# Patient Record
Sex: Female | Born: 1935 | Race: White | Hispanic: No | Smoking: Former smoker
Health system: Southern US, Community
[De-identification: ages and names within clinical notes are randomized; demographics above are authoritative.]

## PROBLEM LIST (undated history)

## (undated) DIAGNOSIS — C50919 Malignant neoplasm of unspecified site of unspecified female breast: Secondary | ICD-10-CM

## (undated) DIAGNOSIS — R634 Abnormal weight loss: Secondary | ICD-10-CM

## (undated) DIAGNOSIS — I6789 Other cerebrovascular disease: Secondary | ICD-10-CM

## (undated) DIAGNOSIS — C801 Malignant (primary) neoplasm, unspecified: Secondary | ICD-10-CM

## (undated) DIAGNOSIS — G231 Progressive supranuclear ophthalmoplegia [Steele-Richardson-Olszewski]: Secondary | ICD-10-CM

## (undated) DIAGNOSIS — R269 Unspecified abnormalities of gait and mobility: Secondary | ICD-10-CM

## (undated) DIAGNOSIS — G311 Senile degeneration of brain, not elsewhere classified: Secondary | ICD-10-CM

## (undated) DIAGNOSIS — S065X9A Traumatic subdural hemorrhage with loss of consciousness of unspecified duration, initial encounter: Secondary | ICD-10-CM

## (undated) DIAGNOSIS — G319 Degenerative disease of nervous system, unspecified: Secondary | ICD-10-CM

## (undated) DIAGNOSIS — S065XAA Traumatic subdural hemorrhage with loss of consciousness status unknown, initial encounter: Secondary | ICD-10-CM

## (undated) DIAGNOSIS — H3410 Central retinal artery occlusion, unspecified eye: Secondary | ICD-10-CM

## (undated) DIAGNOSIS — R1312 Dysphagia, oropharyngeal phase: Secondary | ICD-10-CM

## (undated) DIAGNOSIS — R270 Ataxia, unspecified: Secondary | ICD-10-CM

## (undated) HISTORY — PX: CATARACT EXTRACTION, BILATERAL: SHX1313

## (undated) HISTORY — DX: Traumatic subdural hemorrhage with loss of consciousness status unknown, initial encounter: S06.5XAA

## (undated) HISTORY — DX: Malignant neoplasm of unspecified site of unspecified female breast: C50.919

## (undated) HISTORY — DX: Abnormal weight loss: R63.4

## (undated) HISTORY — DX: Senile degeneration of brain, not elsewhere classified: G31.1

## (undated) HISTORY — DX: Traumatic subdural hemorrhage with loss of consciousness of unspecified duration, initial encounter: S06.5X9A

## (undated) HISTORY — DX: Central retinal artery occlusion, unspecified eye: H34.10

## (undated) HISTORY — DX: Unspecified abnormalities of gait and mobility: R26.9

## (undated) HISTORY — PX: TONSILLECTOMY: SUR1361

## (undated) HISTORY — DX: Progressive supranuclear ophthalmoplegia (steele-Richardson-olszewski): G23.1

## (undated) HISTORY — DX: Dysphagia, oropharyngeal phase: R13.12

## (undated) HISTORY — PX: BREAST SURGERY: SHX581

## (undated) HISTORY — DX: Malignant (primary) neoplasm, unspecified: C80.1

## (undated) HISTORY — DX: Other cerebrovascular disease: I67.89

## (undated) HISTORY — PX: EYE SURGERY: SHX253

---

## 1996-04-23 HISTORY — PX: MASTECTOMY: SHX3

## 1997-08-09 ENCOUNTER — Other Ambulatory Visit: Admission: RE | Admit: 1997-08-09 | Discharge: 1997-08-09 | Payer: Self-pay | Admitting: Oncology

## 1997-12-22 ENCOUNTER — Other Ambulatory Visit: Admission: RE | Admit: 1997-12-22 | Discharge: 1997-12-22 | Payer: Self-pay | Admitting: Family Medicine

## 1998-12-27 ENCOUNTER — Other Ambulatory Visit: Admission: RE | Admit: 1998-12-27 | Discharge: 1998-12-27 | Payer: Self-pay | Admitting: Family Medicine

## 1999-06-08 ENCOUNTER — Ambulatory Visit (HOSPITAL_COMMUNITY): Admission: RE | Admit: 1999-06-08 | Discharge: 1999-06-08 | Payer: Self-pay | Admitting: Obstetrics & Gynecology

## 1999-11-01 ENCOUNTER — Encounter: Payer: Self-pay | Admitting: Oncology

## 1999-11-01 ENCOUNTER — Ambulatory Visit (HOSPITAL_COMMUNITY): Admission: RE | Admit: 1999-11-01 | Discharge: 1999-11-01 | Payer: Self-pay | Admitting: Oncology

## 1999-12-22 ENCOUNTER — Encounter: Payer: Self-pay | Admitting: Family Medicine

## 1999-12-22 ENCOUNTER — Encounter: Admission: RE | Admit: 1999-12-22 | Discharge: 1999-12-22 | Payer: Self-pay | Admitting: Family Medicine

## 2000-01-04 ENCOUNTER — Other Ambulatory Visit: Admission: RE | Admit: 2000-01-04 | Discharge: 2000-01-04 | Payer: Self-pay | Admitting: Family Medicine

## 2000-10-31 ENCOUNTER — Ambulatory Visit (HOSPITAL_COMMUNITY): Admission: RE | Admit: 2000-10-31 | Discharge: 2000-10-31 | Payer: Self-pay | Admitting: Gastroenterology

## 2000-12-25 ENCOUNTER — Encounter: Admission: RE | Admit: 2000-12-25 | Discharge: 2000-12-25 | Payer: Self-pay | Admitting: Family Medicine

## 2000-12-25 ENCOUNTER — Encounter: Payer: Self-pay | Admitting: Family Medicine

## 2001-12-31 ENCOUNTER — Encounter: Payer: Self-pay | Admitting: Family Medicine

## 2001-12-31 ENCOUNTER — Encounter: Admission: RE | Admit: 2001-12-31 | Discharge: 2001-12-31 | Payer: Self-pay | Admitting: Family Medicine

## 2003-01-13 ENCOUNTER — Encounter: Payer: Self-pay | Admitting: Family Medicine

## 2003-01-13 ENCOUNTER — Encounter: Admission: RE | Admit: 2003-01-13 | Discharge: 2003-01-13 | Payer: Self-pay | Admitting: Family Medicine

## 2004-01-25 ENCOUNTER — Encounter: Admission: RE | Admit: 2004-01-25 | Discharge: 2004-01-25 | Payer: Self-pay | Admitting: Oncology

## 2005-01-08 ENCOUNTER — Ambulatory Visit: Payer: Self-pay | Admitting: Oncology

## 2005-02-27 ENCOUNTER — Encounter: Admission: RE | Admit: 2005-02-27 | Discharge: 2005-02-27 | Payer: Self-pay | Admitting: Family Medicine

## 2005-03-05 ENCOUNTER — Ambulatory Visit: Payer: Self-pay | Admitting: Oncology

## 2005-10-24 ENCOUNTER — Emergency Department (HOSPITAL_COMMUNITY): Admission: EM | Admit: 2005-10-24 | Discharge: 2005-10-24 | Payer: Self-pay | Admitting: Emergency Medicine

## 2005-11-07 ENCOUNTER — Encounter: Admission: RE | Admit: 2005-11-07 | Discharge: 2005-11-07 | Payer: Self-pay | Admitting: Gastroenterology

## 2006-03-06 ENCOUNTER — Encounter: Admission: RE | Admit: 2006-03-06 | Discharge: 2006-03-06 | Payer: Self-pay | Admitting: Family Medicine

## 2007-03-11 ENCOUNTER — Encounter: Admission: RE | Admit: 2007-03-11 | Discharge: 2007-03-11 | Payer: Self-pay | Admitting: Family Medicine

## 2008-03-11 ENCOUNTER — Encounter: Admission: RE | Admit: 2008-03-11 | Discharge: 2008-03-11 | Payer: Self-pay | Admitting: Family Medicine

## 2009-03-15 ENCOUNTER — Encounter: Admission: RE | Admit: 2009-03-15 | Discharge: 2009-03-15 | Payer: Self-pay | Admitting: Family Medicine

## 2009-10-14 ENCOUNTER — Encounter: Admission: RE | Admit: 2009-10-14 | Discharge: 2009-10-14 | Payer: Self-pay | Admitting: Family Medicine

## 2009-10-14 ENCOUNTER — Inpatient Hospital Stay (HOSPITAL_COMMUNITY): Admission: EM | Admit: 2009-10-14 | Discharge: 2009-10-18 | Payer: Self-pay | Admitting: Emergency Medicine

## 2009-10-14 DIAGNOSIS — G311 Senile degeneration of brain, not elsewhere classified: Secondary | ICD-10-CM

## 2009-10-14 DIAGNOSIS — I6789 Other cerebrovascular disease: Secondary | ICD-10-CM

## 2009-10-14 HISTORY — DX: Other cerebrovascular disease: I67.89

## 2009-10-14 HISTORY — DX: Senile degeneration of brain, not elsewhere classified: G31.1

## 2009-10-17 ENCOUNTER — Ambulatory Visit: Payer: Self-pay | Admitting: Vascular Surgery

## 2010-04-14 ENCOUNTER — Encounter
Admission: RE | Admit: 2010-04-14 | Discharge: 2010-04-14 | Payer: Self-pay | Source: Home / Self Care | Attending: Internal Medicine | Admitting: Internal Medicine

## 2010-07-10 LAB — LIPID PANEL
HDL: 64 mg/dL (ref >39)
LDL Cholesterol: 132 mg/dL — ABNORMAL HIGH (ref 0–99)
Total CHOL/HDL Ratio: 3.3 RATIO

## 2010-07-10 LAB — PROTIME-INR: INR: 1.03 (ref 0.00–1.49)

## 2010-07-10 LAB — CBC
HCT: 38.4 % (ref 36.0–46.0)
HCT: 39.8 % (ref 36.0–46.0)
Hemoglobin: 12.1 g/dL (ref 12.0–15.0)
MCH: 32.3 pg (ref 26.0–34.0)
MCH: 32.4 pg (ref 26.0–34.0)
MCHC: 34 g/dL (ref 30.0–36.0)
MCHC: 34.2 g/dL (ref 30.0–36.0)
RBC: 3.84 MIL/uL — ABNORMAL LOW (ref 3.87–5.11)
RBC: 4.06 MIL/uL (ref 3.87–5.11)
RDW: 12.5 % (ref 11.5–15.5)
RDW: 12.9 % (ref 11.5–15.5)
WBC: 7.3 10*3/uL (ref 4.0–10.5)

## 2010-07-10 LAB — BASIC METABOLIC PANEL
BUN: 23 mg/dL (ref 6–23)
CO2: 25 mEq/L (ref 19–32)
Calcium: 8.8 mg/dL (ref 8.4–10.5)
Calcium: 9.3 mg/dL (ref 8.4–10.5)
GFR calc non Af Amer: 52 mL/min — ABNORMAL LOW (ref >60)
Glucose, Bld: 89 mg/dL (ref 70–99)
Glucose, Bld: 91 mg/dL (ref 70–99)
Potassium: 3.8 mEq/L (ref 3.5–5.1)
Potassium: 4.2 mEq/L (ref 3.5–5.1)
Sodium: 143 mEq/L (ref 135–145)

## 2010-07-10 LAB — VITAMIN B12: Vitamin B-12: 597 pg/mL (ref 211–911)

## 2010-07-10 LAB — CARDIAC PANEL(CRET KIN+CKTOT+MB+TROPI)
CK, MB: 1.7 ng/mL (ref 0.3–4.0)
CK, MB: 1.8 ng/mL (ref 0.3–4.0)
Relative Index: 1.4 (ref 0.0–2.5)
Relative Index: 1.5 (ref 0.0–2.5)
Total CK: 118 U/L (ref 7–177)
Troponin I: 0.01 ng/mL (ref 0.00–0.06)

## 2010-07-10 LAB — DIFFERENTIAL
Basophils Absolute: 0 10*3/uL (ref 0.0–0.1)
Eosinophils Relative: 2 % (ref 0–5)
Lymphs Abs: 1.7 10*3/uL (ref 0.7–4.0)
Monocytes Absolute: 0.4 10*3/uL (ref 0.1–1.0)
Monocytes Relative: 6 % (ref 3–12)

## 2010-07-10 LAB — ABO/RH: ABO/RH(D): O POS

## 2010-07-10 LAB — RPR: RPR Ser Ql: NONREACTIVE

## 2010-07-10 LAB — CK TOTAL AND CKMB (NOT AT ARMC): Relative Index: 1.3 (ref 0.0–2.5)

## 2010-07-10 LAB — TYPE AND SCREEN: ABO/RH(D): O POS

## 2010-07-10 LAB — TSH: TSH: 1.948 u[IU]/mL (ref 0.350–4.500)

## 2010-07-10 LAB — FOLATE RBC: RBC Folate: 1088 ng/mL — ABNORMAL HIGH (ref 180–600)

## 2010-07-10 LAB — RETICULOCYTES
Retic Count, Absolute: 41.2 10*3/uL (ref 19.0–186.0)
Retic Ct Pct: 1 % (ref 0.4–3.1)

## 2010-07-10 LAB — POCT CARDIAC MARKERS: Troponin i, poc: <0.05 ng/mL (ref 0.00–0.09)

## 2010-07-10 LAB — TROPONIN I: Troponin I: 0.03 ng/mL (ref 0.00–0.06)

## 2010-09-08 NOTE — Procedures (Signed)
[place]. [facility]  Patient:    Monica Waller, Monica Waller                        MRN: [id number] Proc. Date: 10/31/00 Adm. Date:  [id number] Attending:  Orland Mustard CC:         Gita Kudo, M.D.  Abran Cantor. Clovis Riley, M.D.  Valentino Hue. Magrinat, M.D.   Procedure Report  PROCEDURE:  Colonoscopy.  MEDICATIONS:  Fentanyl 60 mcg, Versed 8 mg IV.  SCOPE:  Started with pediatric Olympus colonoscope and switched to adult colonoscope.  INDICATION:  A 77 year old who has a history of breast cancer.  Her brother died of colon cancer, and her father may have had colon cancer.  This was done for screening.  DESCRIPTION OF PROCEDURE:  The procedure had been explained to the patient and consent obtained.  With the patient in the left lateral decubitus position, the Olympus pediatric video colonoscope was inserted and advanced under direct visualization.  The prep was quite good.  Patient had an extremely long, tortuous colon and despite multiple maneuvers, we were unable to advance beyond the transverse colon.  I then removed the scope and inserted the adult Olympus colonoscope, and this scope was able to advance to the hepatic flexure but not down into the cecum despite placing the patient in the left lateral, supine, right lateral, and prone positions and using abdominal pressure, multiple other maneuvers.  The scope was withdrawn, and the transverse colon, splenic flexure, descending, and sigmoid colon were seen well.  No polyps or other lesions were seen.  The patient tolerated the procedure well.  ASSESSMENT:  Colonoscopy negative to the hepatic flexure.  PLAN:  Will plan on screening her in five years with a barium enema and sigmoidoscopy and will go ahead and see her back in the office in six weeks to discuss this and to consider a barium enema and check her stools. DD:  10/31/00 TD:  10/31/00 Job: [id number]

## 2011-07-04 ENCOUNTER — Emergency Department (HOSPITAL_BASED_OUTPATIENT_CLINIC_OR_DEPARTMENT_OTHER)
Admission: EM | Admit: 2011-07-04 | Discharge: 2011-07-05 | Disposition: A | Discharge status: Home / Self Care | Payer: Medicare Other | Attending: Emergency Medicine | Admitting: Emergency Medicine

## 2011-07-04 ENCOUNTER — Encounter (HOSPITAL_BASED_OUTPATIENT_CLINIC_OR_DEPARTMENT_OTHER): Payer: Self-pay | Admitting: *Deleted

## 2011-07-04 DIAGNOSIS — W19XXXA Unspecified fall, initial encounter: Secondary | ICD-10-CM

## 2011-07-04 DIAGNOSIS — S0100XA Unspecified open wound of scalp, initial encounter: Secondary | ICD-10-CM | POA: Insufficient documentation

## 2011-07-04 DIAGNOSIS — S0101XA Laceration without foreign body of scalp, initial encounter: Secondary | ICD-10-CM

## 2011-07-04 DIAGNOSIS — W1809XA Striking against other object with subsequent fall, initial encounter: Secondary | ICD-10-CM | POA: Insufficient documentation

## 2011-07-04 DIAGNOSIS — G319 Degenerative disease of nervous system, unspecified: Secondary | ICD-10-CM | POA: Insufficient documentation

## 2011-07-04 HISTORY — DX: Ataxia, unspecified: R27.0

## 2011-07-04 HISTORY — DX: Degenerative disease of nervous system, unspecified: G31.9

## 2011-07-04 NOTE — ED Notes (Signed)
Pt. Has had a L mastectomy 15 yrs ago per pt.

## 2011-07-04 NOTE — ED Notes (Signed)
Pt arrived via [facility] s/p fall from standing.  Pt has no neck or back pain and small bruise noted to back right side of head by EMS.

## 2011-07-05 ENCOUNTER — Emergency Department (INDEPENDENT_AMBULATORY_CARE_PROVIDER_SITE_OTHER): Payer: Medicare Other

## 2011-07-05 DIAGNOSIS — S0190XA Unspecified open wound of unspecified part of head, initial encounter: Secondary | ICD-10-CM

## 2011-07-05 DIAGNOSIS — W19XXXA Unspecified fall, initial encounter: Secondary | ICD-10-CM

## 2011-07-05 NOTE — ED Notes (Signed)
Pt. Is in no distress.   RN Earlene Plater called [facility] to check on living will status.  Pt. Has only in house DNR with out of house full code per Darral Dash at [facility] where Pt. Lives.

## 2011-07-05 NOTE — ED Notes (Signed)
MD at bedside. 

## 2011-07-05 NOTE — ED Notes (Signed)
Call placed for pt transport back to [facility].

## 2011-07-05 NOTE — ED Notes (Signed)
Dressing applied per MD order, pt tolerated well.

## 2011-07-05 NOTE — ED Notes (Signed)
Pt report called and given to Clydie Braun at [facility].

## 2011-07-05 NOTE — ED Notes (Signed)
Pt. Fell after using the rest room and returning to her room she fell backward hitting the dresser with the L back side of her head.  Noted small 1inch laceration with controlled bleeding.

## 2011-07-05 NOTE — ED Notes (Signed)
Laceration to right side of head repaired by Dr Jeraldine Loots using staples, pt tolerated well.

## 2011-07-05 NOTE — ED Provider Notes (Signed)
History     CSN: [id number]  Arrival date & time 07/04/11  2340   First MD Initiated Contact with Patient 07/05/11 0025      Chief Complaint  Patient presents with  . Fall    (Consider location/radiation/quality/duration/timing/severity/associated sxs/prior treatment) HPI The patient presents with head pain and a laceration.  She notes that she stumbled just prior to presentation, fell striking the right side of her head against a piece of furniture.  No loss of consciousness, no emesis. Since the event there has been mild bleeding from the wound and persistent pain, described as soreness in the area.  at relief thus far. No new visual changes, ataxia, weakness, aphasia or any other focal complaints. Past Medical History  Diagnosis Date  . Cerebral degeneration   . Ataxia     Past Surgical History  Procedure Date  . Mastectomy     History reviewed. No pertinent family history.  History  Substance Use Topics  . Smoking status: Not on file  . Smokeless tobacco: Not on file  . Alcohol Use:     OB History    Grav Para Term Preterm Abortions TAB SAB Ect Mult Living                  Review of Systems  Constitutional:       HPI  HENT:       HPI otherwise negative  Eyes: Negative for pain, redness and visual disturbance.  Respiratory:       HPI, otherwise negative  Cardiovascular:       HPI, otherwise nmegative  Gastrointestinal: Negative for vomiting.  Genitourinary:       HPI, otherwise negative  Musculoskeletal:       HPI, otherwise negative  Skin: Negative.   Neurological: Negative for syncope.    Allergies  Review of patient's allergies indicates no known allergies.  Home Medications   Current Outpatient Rx  Name Route Sig Dispense Refill  . CALCIUM CARB-CHOLECALCIFEROL 600-800 MG-UNIT PO TABS Oral Take 1 tablet by mouth.    . CENTRUM PO CHEW Oral Chew 1 tablet by mouth daily.      BP 138/61  Pulse 66  Temp(Src) 98.4 F (36.9 C) (Oral)  Resp  20  SpO2 99%  Physical Exam  Nursing note and vitals reviewed. Constitutional: She is oriented to person, place, and time. She appears well-developed and well-nourished. No distress.  HENT:  Head: Normocephalic. Head is with laceration. Head is without raccoon's eyes, without Battle's sign, without abrasion, without contusion and without left periorbital erythema. Hair is normal.    Eyes: Conjunctivae and EOM are normal. Pupils are equal, round, and reactive to light.  Cardiovascular: Normal rate and regular rhythm.   Pulmonary/Chest: Effort normal and breath sounds normal. No stridor. No respiratory distress.  Abdominal: She exhibits no distension.  Musculoskeletal: She exhibits no edema.  Neurological: She is alert and oriented to person, place, and time. No cranial nerve deficit.  Skin: Skin is warm and dry.  Psychiatric: She has a normal mood and affect.    ED Course  LACERATION REPAIR Date/Time: 07/05/2011 1:19 AM Performed by: Gerhard Munch Authorized by: Gerhard Munch Consent: Verbal consent obtained. Written consent not obtained. The procedure was performed in an emergent situation. Risks and benefits: risks, benefits and alternatives were discussed Consent given by: patient Patient identity confirmed: verbally with patient Time out: Immediately prior to procedure a "time out" was called to verify the correct patient, procedure, equipment, support staff  and site/side marked as required. Body area: head/neck Location details: scalp Laceration length: 4 cm Tendon involvement: none Nerve involvement: none Vascular damage: no Local anesthetic: None. Preparation: Patient was prepped and draped in the usual sterile fashion. Irrigation solution: saline Irrigation method: syringe Debridement: none Degree of undermining: none Skin closure: staples Number of sutures: 2 Technique: simple Approximation: close Approximation difficulty: simple Dressing: antibiotic  ointment Patient tolerance: Patient tolerated the procedure well with no immediate complications.   (including critical care time)  Labs Reviewed - No data to display Ct Head Wo Contrast  07/05/2011  *RADIOLOGY REPORT*  Clinical Data: Fall  CT HEAD WITHOUT CONTRAST  Technique:  Contiguous axial images were obtained from the base of the skull through the vertex without contrast.  Comparison: 10/14/2009  Findings: Chronic ischemic changes.  Global atrophy.  No mass effect, midline shift, or acute intracranial hemorrhage.  Nasal septum is deviated.  Mastoid air cells are clear.  Minimal mucous material in the ethmoid air cells.  Intact cranium.  IMPRESSION: No acute intracranial pathology.  Original Report Authenticated By: Donavan Burnet, M.D.   CT reviewed by me  1. Fall   2. Laceration of scalp       MDM  This elderly patient presents in no distress after a seemingly mechanical fall, now with head pain and leaking wound to her right scalp.  Nondistended palpation, and is concern for intracranial hemorrhage given the severity of the trauma.  The patient's CAT scan did not demonstrate acute bleeding nor fracture.  The wound was repaired as above.  The patient was discharged in stable condition back to her nursing home.     Gerhard Munch, MD 07/05/11 0120

## 2011-09-12 ENCOUNTER — Ambulatory Visit: Payer: Medicare Other | Attending: Neurology | Admitting: Physical Therapy

## 2011-09-12 DIAGNOSIS — IMO0001 Reserved for inherently not codable concepts without codable children: Secondary | ICD-10-CM | POA: Insufficient documentation

## 2011-09-12 DIAGNOSIS — M6281 Muscle weakness (generalized): Secondary | ICD-10-CM | POA: Insufficient documentation

## 2011-09-12 DIAGNOSIS — R269 Unspecified abnormalities of gait and mobility: Secondary | ICD-10-CM | POA: Insufficient documentation

## 2011-09-12 DIAGNOSIS — R279 Unspecified lack of coordination: Secondary | ICD-10-CM | POA: Insufficient documentation

## 2011-09-18 ENCOUNTER — Ambulatory Visit: Payer: Medicare Other | Admitting: Physical Therapy

## 2011-09-24 ENCOUNTER — Ambulatory Visit: Payer: Medicare Other | Attending: Neurology | Admitting: Physical Therapy

## 2011-09-24 DIAGNOSIS — R279 Unspecified lack of coordination: Secondary | ICD-10-CM | POA: Insufficient documentation

## 2011-09-24 DIAGNOSIS — M6281 Muscle weakness (generalized): Secondary | ICD-10-CM | POA: Insufficient documentation

## 2011-09-24 DIAGNOSIS — R269 Unspecified abnormalities of gait and mobility: Secondary | ICD-10-CM | POA: Insufficient documentation

## 2011-09-24 DIAGNOSIS — IMO0001 Reserved for inherently not codable concepts without codable children: Secondary | ICD-10-CM | POA: Insufficient documentation

## 2011-09-26 ENCOUNTER — Ambulatory Visit: Payer: Medicare Other

## 2011-09-27 ENCOUNTER — Encounter: Payer: Medicare Other | Admitting: Physical Therapy

## 2011-10-01 ENCOUNTER — Ambulatory Visit: Payer: Medicare Other | Admitting: Physical Therapy

## 2011-10-02 ENCOUNTER — Encounter: Payer: Medicare Other | Admitting: Physical Therapy

## 2011-10-03 ENCOUNTER — Ambulatory Visit: Payer: Medicare Other

## 2011-10-04 ENCOUNTER — Encounter: Payer: Medicare Other | Admitting: Physical Therapy

## 2011-10-08 ENCOUNTER — Ambulatory Visit: Payer: Medicare Other | Admitting: Physical Therapy

## 2011-10-09 ENCOUNTER — Encounter: Payer: Medicare Other | Admitting: Physical Therapy

## 2011-10-10 ENCOUNTER — Ambulatory Visit: Payer: Medicare Other | Admitting: Physical Therapy

## 2011-10-11 ENCOUNTER — Encounter: Payer: Medicare Other | Admitting: Physical Therapy

## 2011-10-15 ENCOUNTER — Ambulatory Visit: Payer: Medicare Other

## 2011-10-17 ENCOUNTER — Encounter: Payer: Medicare Other | Admitting: Physical Therapy

## 2012-06-04 ENCOUNTER — Encounter: Payer: Self-pay | Admitting: Neurology

## 2012-06-04 DIAGNOSIS — R6889 Other general symptoms and signs: Secondary | ICD-10-CM

## 2012-06-04 DIAGNOSIS — G231 Progressive supranuclear ophthalmoplegia [Steele-Richardson-Olszewski]: Secondary | ICD-10-CM

## 2012-06-04 DIAGNOSIS — R269 Unspecified abnormalities of gait and mobility: Secondary | ICD-10-CM

## 2012-06-04 DIAGNOSIS — D649 Anemia, unspecified: Secondary | ICD-10-CM | POA: Insufficient documentation

## 2012-07-18 ENCOUNTER — Encounter: Payer: Self-pay | Admitting: Neurology

## 2012-07-18 ENCOUNTER — Ambulatory Visit: Payer: Self-pay | Admitting: Neurology

## 2012-07-18 ENCOUNTER — Ambulatory Visit (INDEPENDENT_AMBULATORY_CARE_PROVIDER_SITE_OTHER): Payer: Medicare Other | Admitting: Neurology

## 2012-07-18 VITALS — BP 113/63 | HR 80 | Ht 65.25 in | Wt 126.0 lb

## 2012-07-18 DIAGNOSIS — G231 Progressive supranuclear ophthalmoplegia [Steele-Richardson-Olszewski]: Secondary | ICD-10-CM

## 2012-07-18 DIAGNOSIS — G238 Other specified degenerative diseases of basal ganglia: Secondary | ICD-10-CM

## 2012-07-18 DIAGNOSIS — R269 Unspecified abnormalities of gait and mobility: Secondary | ICD-10-CM

## 2012-07-18 NOTE — Patient Instructions (Signed)
Walk only with assistance. May use the wheelchair as a walker.    Progressive Supranuclear Palsy Progressive supranuclear palsy (PSP) is a rare brain disorder. It causes serious and permanent problems with control of gait and balance. The symptoms of PSP are caused by a gradual deterioration of brain cells in a few tiny but important places at the base of the brain. This region is called the brainstem. SYMPTOMS   The most obvious sign of the disease is an inability to aim the eyes properly. This happens because of lesions in the area of the brain that coordinates eye movements. Some patients describe this effect as a blurring.  PSP patients often show changes mood and behavior. This includes:  Depression.  Apathy.  Progressive mild dementia.  The pattern of signs and symptoms can vary greatly from person to person. DIAGNOSIS  PSP is often misdiagnosed because some of its symptoms are very much like those of:  Parkinson's disease.  Alzheimer's disease.  Rare neurodegenerative disorders, such as Creutzfeldt-Jakob disease. The key to establishing the diagnosis of PSP is the identification of early gait instability and difficulty moving the eyes, the hallmark of the disease. Also important is ruling out other similar disorders, some of which are treatable. Although PSP gets progressively worse, no one dies from PSP itself. TREATMENT  There is currently no effective treatment for PSP. Scientists are searching for better ways to manage the disease.  In some patients, the slowness, stiffness, and balance problems of PSP may respond to antiparkinsonian agents such as levodopa, or levodopa combined with anticholinergic agents. But the effect is usually temporary.  The speech, vision, and swallowing difficulties usually do not respond to any drug treatment. Another group of drugs that has been of some modest success in PSP are antidepressant medications. The most commonly used of these drugs  are Elavil, and Tofranil. The anti-PSP benefit of these drugs seems not to be related to their ability to relieve depression.  Non-drug treatment for PSP can take many forms. Patients often use weighted walking aids because of their tendency to fall backward.  Bifocals or special glasses called prisms are sometimes prescribed for PSP patients. They can correct the difficulty of looking down.  Formal physical therapy is of no proven benefit in PSP. But certain exercises can keep the joints limber.  A surgical procedure may be needed when there are swallowing disturbances. It is called a gastrostomy. This surgery involves the placement of a tube through the skin of the abdomen into the stomach (intestine) for feeding purposes. Document Released: 03/30/2002 Document Revised: 07/02/2011 Document Reviewed: 04/09/2005 [facility] Patient Information 2013 [place], [place].

## 2012-07-18 NOTE — Progress Notes (Signed)
Reason for visit: Progressive supranuclear palsy  Monica Waller is an 79 y.o. female  History of present illness:  Monica Waller is an 79 year old left-handed white female with a history of progressive supranuclear palsy. Since last seen, the patient has indicated that she has increasing problems with reading. Essentially, she is unable to read at this time. The patient has severe vertical gaze paresis, and a significant gait disorder. The patient has had a fall within the last 2 or 3 days. The patient has a tendency to fall backwards. The patient just recently has gotten a wheelchair, and she is now spending most of her time in a wheelchair, mobilizing by using her legs. The patient denies any issues with chewing or swallowing. No other new medical issues have come up since last seen. The patient returns for an evaluation.  Past Medical History  Diagnosis Date  . Cerebral degeneration   . Ataxia   . Gait disorder   . Progressive supranuclear palsy   . Subdural hematoma     Hx. of a left frontal    Past Surgical History  Procedure Laterality Date  . Mastectomy    . Tonsillectomy    . Cataract extraction, bilateral      Family History  Problem Relation Age of Onset  . Other Mother     "Old Age"  . Cancer Father   . Cancer Brother     Colon    Social history:  reports that she quit smoking about 47 years ago. Her smoking use included Cigarettes. She smoked 0.00 packs per day. She does not have any smokeless tobacco history on file. She reports that she does not drink alcohol. Her drug history is not on file.  Allergies: No Known Allergies  Medications:  Current Outpatient Prescriptions on File Prior to Visit  Medication Sig Dispense Refill  . Calcium Carb-Cholecalciferol (CALTRATE 600+D) 600-800 MG-UNIT TABS Take 1 tablet by mouth.      . multivitamin-iron-minerals-folic acid (CENTRUM) chewable tablet Chew 1 tablet by mouth daily.       No current facility-administered  medications on file prior to visit.    ROS:  Out of a complete 14 system review of symptoms, the patient complains only of the following symptoms, and all other reviewed systems are negative.  Gait disturbance Difficulty reading  Blood pressure 113/63, pulse 80, height 5' 5.25" (1.657 m), weight 126 lb (57.153 kg).  Physical Exam  General: The patient is alert and cooperative at the time of the examination.  Skin: No significant peripheral edema is noted.   Neurologic Exam  Cranial nerves: Facial symmetry is present. Speech is slightly dysphonic and dysarthric, not aphasic. Extraocular movements are severely restricted in a vertical plane, with mild restriction in the horizontal plane. Visual fields are full. Masking of the face is noted.  Motor: The patient has good strength in all 4 extremities.  Coordination: The patient has good finger-nose-finger and heel-to-shin bilaterally.  Gait and station: The patient has a slightly wide-based, unsteady gait. The patient can walk only with assistance. Tandem gait was not attempted. Romberg is negative, but it is unsteady. No drift is seen.  Reflexes: Deep tendon reflexes are symmetric.   Assessment/Plan:  1. Progressive supranuclear palsy  2. Gait disturbance  The patient is only to ambulate with assistance. The patient may be able to use her wheelchair as a walker, as this has more weight than the standard walker. The patient clearly is unstable with ambulation. The patient may  become wheelchair-bound completely. The patient is on minimal medications. The patient will followup through this office in about 6-8 months.  Marlan Palau MD 07/18/2012 2:56 PM  [facility] [address] [place], [place] [address]  Phone [phone] Fax [phone]

## 2012-11-03 LAB — BASIC METABOLIC PANEL
BUN: 15 mg/dL (ref 4–21)
Creatinine: 0.7 mg/dL (ref 0.5–1.1)
Glucose: 110 mg/dL
Potassium: 4.2 mmol/L (ref 3.4–5.3)
Sodium: 135 mmol/L — AB (ref 137–147)

## 2012-11-03 LAB — CBC AND DIFFERENTIAL: WBC: 8 10^3/mL

## 2012-11-04 ENCOUNTER — Emergency Department (HOSPITAL_COMMUNITY)
Admission: EM | Admit: 2012-11-04 | Discharge: 2012-11-04 | Disposition: A | Discharge status: Skilled Nursing Facility | Payer: Medicare Other | Attending: Emergency Medicine | Admitting: Emergency Medicine

## 2012-11-04 ENCOUNTER — Encounter (HOSPITAL_COMMUNITY): Payer: Self-pay | Admitting: Adult Health

## 2012-11-04 ENCOUNTER — Non-Acute Institutional Stay: Payer: Medicare Other | Admitting: Nurse Practitioner

## 2012-11-04 ENCOUNTER — Emergency Department (HOSPITAL_COMMUNITY): Payer: Medicare Other

## 2012-11-04 DIAGNOSIS — Y939 Activity, unspecified: Secondary | ICD-10-CM | POA: Insufficient documentation

## 2012-11-04 DIAGNOSIS — Y9210 Unspecified residential institution as the place of occurrence of the external cause: Secondary | ICD-10-CM | POA: Insufficient documentation

## 2012-11-04 DIAGNOSIS — Y92129 Unspecified place in nursing home as the place of occurrence of the external cause: Secondary | ICD-10-CM

## 2012-11-04 DIAGNOSIS — Z8669 Personal history of other diseases of the nervous system and sense organs: Secondary | ICD-10-CM | POA: Insufficient documentation

## 2012-11-04 DIAGNOSIS — Z853 Personal history of malignant neoplasm of breast: Secondary | ICD-10-CM | POA: Insufficient documentation

## 2012-11-04 DIAGNOSIS — W19XXXA Unspecified fall, initial encounter: Secondary | ICD-10-CM | POA: Insufficient documentation

## 2012-11-04 DIAGNOSIS — S51811A Laceration without foreign body of right forearm, initial encounter: Secondary | ICD-10-CM

## 2012-11-04 DIAGNOSIS — S0010XA Contusion of unspecified eyelid and periocular area, initial encounter: Secondary | ICD-10-CM | POA: Insufficient documentation

## 2012-11-04 DIAGNOSIS — IMO0002 Reserved for concepts with insufficient information to code with codable children: Secondary | ICD-10-CM

## 2012-11-04 DIAGNOSIS — S0181XS Laceration without foreign body of other part of head, sequela: Secondary | ICD-10-CM

## 2012-11-04 DIAGNOSIS — Z87891 Personal history of nicotine dependence: Secondary | ICD-10-CM | POA: Insufficient documentation

## 2012-11-04 DIAGNOSIS — S0180XA Unspecified open wound of other part of head, initial encounter: Secondary | ICD-10-CM | POA: Insufficient documentation

## 2012-11-04 DIAGNOSIS — S51809A Unspecified open wound of unspecified forearm, initial encounter: Secondary | ICD-10-CM | POA: Insufficient documentation

## 2012-11-04 DIAGNOSIS — D649 Anemia, unspecified: Secondary | ICD-10-CM

## 2012-11-04 DIAGNOSIS — G231 Progressive supranuclear ophthalmoplegia [Steele-Richardson-Olszewski]: Secondary | ICD-10-CM

## 2012-11-04 DIAGNOSIS — F0390 Unspecified dementia without behavioral disturbance: Secondary | ICD-10-CM | POA: Insufficient documentation

## 2012-11-04 DIAGNOSIS — Z23 Encounter for immunization: Secondary | ICD-10-CM | POA: Insufficient documentation

## 2012-11-04 DIAGNOSIS — G238 Other specified degenerative diseases of basal ganglia: Secondary | ICD-10-CM

## 2012-11-04 DIAGNOSIS — Z79899 Other long term (current) drug therapy: Secondary | ICD-10-CM | POA: Insufficient documentation

## 2012-11-04 MED ORDER — TETANUS-DIPHTH-ACELL PERTUSSIS 5-2.5-18.5 LF-MCG/0.5 IM SUSP
0.5000 mL | Freq: Once | INTRAMUSCULAR | Status: AC
  Administered 2012-11-04: 0.5 mL via INTRAMUSCULAR
  Filled 2012-11-04: qty 0.5

## 2012-11-04 NOTE — ED Provider Notes (Signed)
History    CSN: [id number] Arrival date & time 11/04/12  0039  First MD Initiated Contact with Patient 11/04/12 0041     Chief Complaint  Patient presents with  . Fall   (Consider location/radiation/quality/duration/timing/severity/associated sxs/prior Treatment) Patient is a 79 y.o. female presenting with fall. The history is provided by the nursing home. The history is limited by the condition of the patient (Dementia).  Fall  She was sent from a nursing care facility after his suffering an unwitnessed fall with laceration above her right eye. She does not know why she is here and does not know when her last tetanus immunization was. She is not complaining of anything. Past Medical History  Diagnosis Date  . Cerebral degeneration   . Ataxia   . Gait disorder   . Progressive supranuclear palsy   . Subdural hematoma     Hx. of a left frontal  . Cancer     Breast   Past Surgical History  Procedure Laterality Date  . Mastectomy    . Tonsillectomy    . Cataract extraction, bilateral     Family History  Problem Relation Age of Onset  . Other Mother     "Old Age"  . Cancer Father   . Cancer Brother     Colon   History  Substance Use Topics  . Smoking status: Former Smoker    Types: Cigarettes    Quit date: 05/04/1965  . Smokeless tobacco: Not on file  . Alcohol Use: No     Comment: Quit drinking   OB History   Grav Para Term Preterm Abortions TAB SAB Ect Mult Living                 Review of Systems  Unable to perform ROS: Dementia    Allergies  Review of patient's allergies indicates no known allergies.  Home Medications   Current Outpatient Rx  Name  Route  Sig  Dispense  Refill  . Calcium Carb-Cholecalciferol (CALTRATE 600+D) 600-800 MG-UNIT TABS   Oral   Take 1 tablet by mouth.         . multivitamin-iron-minerals-folic acid (CENTRUM) chewable tablet   Oral   Chew 1 tablet by mouth daily.         . polyvinyl alcohol (LIQUIFILM TEARS) 1.4  % ophthalmic solution      1 drop as needed.          BP 148/61  Pulse 75  Temp(Src) 98.3 F (36.8 C) (Oral)  Resp 16  SpO2 100% Physical Exam  Nursing note and vitals reviewed.  79 year old female, resting comfortably and in no acute distress. Vital signs are significant for mild hypertension with blood pressure 140/61. Oxygen saturation is 100%, which is normal. Head is normocephalic. Stellate laceration is present above the right eye and there is some ecchymosis around the left eye. PERRLA, EOMI. Oropharynx is clear. Neck is nontender without adenopathy or JVD. Back is nontender and there is no CVA tenderness. Lungs are clear without rales, wheezes, or rhonchi. Chest is nontender. Heart has regular rate and rhythm without murmur. Abdomen is soft, flat, nontender without masses or hepatosplenomegaly and peristalsis is normoactive. Extremities have no cyanosis or edema, full range of motion is present. Skin is warm and dry without rash. Neurologic: She is awake, alert, oriented to person and place. She is oriented to day and month but not year stating that it is 1908, cranial nerves are intact, there are no motor  or sensory deficits.  ED Course  Procedures (including critical care time) LACERATION REPAIR Performed by: ACZYS,AYTKZ Authorized by: SWFUX,NATFT Consent: Verbal consent obtained. Risks and benefits: risks, benefits and alternatives were discussed Consent given by: patient Patient identity confirmed: provided demographic data Prepped and Draped in normal sterile fashion Wound explored  Laceration Location: Right forehead  Laceration Length: 5 cm  No Foreign Bodies seen or palpated  Anesthesia: local infiltration  Local anesthetic: lidocaine 2% with epinephrine  Anesthetic total: 3 ml  Amount of cleaning: standard  Skin closure: close  Number of sutures: 10 5-0 Nylon  Technique: Simple interrupted with plastic-type alignment and closure of flaps    Patient tolerance: Patient tolerated the procedure well with no immediate complications.  Ct Head Wo Contrast  11/04/2012   *RADIOLOGY REPORT*  Clinical Data:  Unwitnessed fall with laceration.  CT HEAD WITHOUT CONTRAST CT MAXILLOFACIAL WITHOUT CONTRAST CT CERVICAL SPINE WITHOUT CONTRAST  Technique:  Multidetector CT imaging of the head, cervical spine, and maxillofacial structures were performed using the standard protocol without intravenous contrast. Multiplanar CT image reconstructions of the cervical spine and maxillofacial structures were also generated.  Comparison:  07/05/2011 head CT  CT HEAD  Findings:  Calvarium: Right periorbital laceration and contusion.  No evidence of underlying globe injury.  No retrobulbar hematoma.  No fracture.  Orbits: Bilateral cataract resection.  Brain: No evidence of acute abnormality, such as acute infarction, hemorrhage, hydrocephalus, or mass lesion/mass effect.Senescent brain changes including global cerebral atrophy and patchy bilateral cerebral white matter low attenuation - consistent with chronic small vessel ischemia.  Chronic hygroma in the posterior fossa on the left, with mild mass effect on the neighboring cerebellum.  IMPRESSION:  1.  No evidence of acute intracranial disease. 2.  Right periorbital laceration and contusion without evidence of global injury or fracture.  CT MAXILLOFACIAL  Findings:  Right temporal periorbital laceration and contusion.  No evidence of globe injury.  No intra orbital hematoma.  Status post bilateral cataract resection.  No evidence of acute fracture. Paranasal sinuses are essentially clear.  Leftward nasal septal spurring, contacting the middle turbinate.  IMPRESSION: Right periorbital contusion and laceration.  No evidence of globe injury or fracture.  CT CERVICAL SPINE  Findings:   No evidence of acute fracture.  There is are multiple mild listheses: C3-4 anterolisthesis, C4-5 anterolisthesis, C7-T1 anterolisthesis, T1-T2  anterolisthesis, and C5-6 retrolisthesis. At all of these levels, there is advanced facet osteoarthritis which could account for the subluxation.  No perivertebral edema identified to suggest ligamentous injury.  Diffuse degenerative disc disease, with disc narrowing most advanced at C4-5, C5-6, C6-7.  Posterior osteophytic ridging at C5- 6 effaces the ventral spinal canal.  Despite bulky facet and uncovertebral spurring, right more than left, no focally advanced neural foraminal stenosis.  Cervical carotid atherosclerosis.  Apical lungs are clear.  IMPRESSION:  1.  Negative for acute cervical spine fracture. 2.  Diffuse degenerative disc and facet disease, as above.   Original Report Authenticated By: Tiburcio Pea   Ct Cervical Spine Wo Contrast  11/04/2012   *RADIOLOGY REPORT*  Clinical Data:  Unwitnessed fall with laceration.  CT HEAD WITHOUT CONTRAST CT MAXILLOFACIAL WITHOUT CONTRAST CT CERVICAL SPINE WITHOUT CONTRAST  Technique:  Multidetector CT imaging of the head, cervical spine, and maxillofacial structures were performed using the standard protocol without intravenous contrast. Multiplanar CT image reconstructions of the cervical spine and maxillofacial structures were also generated.  Comparison:  07/05/2011 head CT  CT HEAD  Findings:  Calvarium: Right periorbital laceration and contusion.  No evidence of underlying globe injury.  No retrobulbar hematoma.  No fracture.  Orbits: Bilateral cataract resection.  Brain: No evidence of acute abnormality, such as acute infarction, hemorrhage, hydrocephalus, or mass lesion/mass effect.Senescent brain changes including global cerebral atrophy and patchy bilateral cerebral white matter low attenuation - consistent with chronic small vessel ischemia.  Chronic hygroma in the posterior fossa on the left, with mild mass effect on the neighboring cerebellum.  IMPRESSION:  1.  No evidence of acute intracranial disease. 2.  Right periorbital laceration and contusion  without evidence of global injury or fracture.  CT MAXILLOFACIAL  Findings:  Right temporal periorbital laceration and contusion.  No evidence of globe injury.  No intra orbital hematoma.  Status post bilateral cataract resection.  No evidence of acute fracture. Paranasal sinuses are essentially clear.  Leftward nasal septal spurring, contacting the middle turbinate.  IMPRESSION: Right periorbital contusion and laceration.  No evidence of globe injury or fracture.  CT CERVICAL SPINE  Findings:   No evidence of acute fracture.  There is are multiple mild listheses: C3-4 anterolisthesis, C4-5 anterolisthesis, C7-T1 anterolisthesis, T1-T2 anterolisthesis, and C5-6 retrolisthesis. At all of these levels, there is advanced facet osteoarthritis which could account for the subluxation.  No perivertebral edema identified to suggest ligamentous injury.  Diffuse degenerative disc disease, with disc narrowing most advanced at C4-5, C5-6, C6-7.  Posterior osteophytic ridging at C5- 6 effaces the ventral spinal canal.  Despite bulky facet and uncovertebral spurring, right more than left, no focally advanced neural foraminal stenosis.  Cervical carotid atherosclerosis.  Apical lungs are clear.  IMPRESSION:  1.  Negative for acute cervical spine fracture. 2.  Diffuse degenerative disc and facet disease, as above.   Original Report Authenticated By: Tiburcio Pea   Ct Maxillofacial Wo Cm  11/04/2012   *RADIOLOGY REPORT*  Clinical Data:  Unwitnessed fall with laceration.  CT HEAD WITHOUT CONTRAST CT MAXILLOFACIAL WITHOUT CONTRAST CT CERVICAL SPINE WITHOUT CONTRAST  Technique:  Multidetector CT imaging of the head, cervical spine, and maxillofacial structures were performed using the standard protocol without intravenous contrast. Multiplanar CT image reconstructions of the cervical spine and maxillofacial structures were also generated.  Comparison:  07/05/2011 head CT  CT HEAD  Findings:  Calvarium: Right periorbital laceration  and contusion.  No evidence of underlying globe injury.  No retrobulbar hematoma.  No fracture.  Orbits: Bilateral cataract resection.  Brain: No evidence of acute abnormality, such as acute infarction, hemorrhage, hydrocephalus, or mass lesion/mass effect.Senescent brain changes including global cerebral atrophy and patchy bilateral cerebral white matter low attenuation - consistent with chronic small vessel ischemia.  Chronic hygroma in the posterior fossa on the left, with mild mass effect on the neighboring cerebellum.  IMPRESSION:  1.  No evidence of acute intracranial disease. 2.  Right periorbital laceration and contusion without evidence of global injury or fracture.  CT MAXILLOFACIAL  Findings:  Right temporal periorbital laceration and contusion.  No evidence of globe injury.  No intra orbital hematoma.  Status post bilateral cataract resection.  No evidence of acute fracture. Paranasal sinuses are essentially clear.  Leftward nasal septal spurring, contacting the middle turbinate.  IMPRESSION: Right periorbital contusion and laceration.  No evidence of globe injury or fracture.  CT CERVICAL SPINE  Findings:   No evidence of acute fracture.  There is are multiple mild listheses: C3-4 anterolisthesis, C4-5 anterolisthesis, C7-T1 anterolisthesis, T1-T2 anterolisthesis, and C5-6 retrolisthesis. At all of these levels, there is  advanced facet osteoarthritis which could account for the subluxation.  No perivertebral edema identified to suggest ligamentous injury.  Diffuse degenerative disc disease, with disc narrowing most advanced at C4-5, C5-6, C6-7.  Posterior osteophytic ridging at C5- 6 effaces the ventral spinal canal.  Despite bulky facet and uncovertebral spurring, right more than left, no focally advanced neural foraminal stenosis.  Cervical carotid atherosclerosis.  Apical lungs are clear.  IMPRESSION:  1.  Negative for acute cervical spine fracture. 2.  Diffuse degenerative disc and facet disease, as  above.   Original Report Authenticated By: Tiburcio Pea     Date: 11/04/2012  Rate: 76  Rhythm: normal sinus rhythm  QRS Axis: normal  Intervals: normal  ST/T Wave abnormalities: normal  Conduction Disutrbances:none  Narrative Interpretation: Poor R-wave progression across precordium. No prior ECG available for comparison.  Old EKG Reviewed: none available   1. Fall at nursing home, initial encounter   2. Laceration of forearm, right, initial encounter     MDM  Unwitnessed fall with facial injury. She will need to be sent for CT scans to rule out occult neck and intracranial injury and will need to laceration repair. TDaP booster will be given.  CT scans are unremarkable. Laceration is noted to be complex with multiple flaps which need to be aligned. Plastic closure techniques were used and there was good cosmetic closure. She's returned to her nursing care facility to have sutures removed in 5 days.  Dione Booze, MD 11/04/12 [phone]

## 2012-11-04 NOTE — ED Notes (Signed)
Presents from [facility] with a unwitnessed fall. Staff found pt lying in bed covered in blood. Noted 1/2 inch laceration to right temple, pt does not recall fall. She is alert and oriented. Does not know year but answers month and age correctly. No other injuries noted, pt denies pain. PERRLA

## 2012-11-07 ENCOUNTER — Encounter: Payer: Self-pay | Admitting: Nurse Practitioner

## 2012-11-07 DIAGNOSIS — S0181XA Laceration without foreign body of other part of head, initial encounter: Secondary | ICD-10-CM | POA: Insufficient documentation

## 2012-11-07 NOTE — Progress Notes (Signed)
Patient ID: Monica Waller, female   DOB: Oct 19, 1923, 79 y.o.   MRN: [id number] Code Status: DNR  No Known Allergies  Chief Complaint  Patient presents with  . Medical Managment of Chronic Issues     s/p ER evaluation 11/04/12 for right forehead laceration sustained from falligng at [facility]    HPI: Patient is a 79 y.o. female seen in the AL at [facility] today for evaluation of right forehead skin laceration, s/p ED evaluation 11/04/12, and other chronic medical conditions.  Problem List Items Addressed This Visit   Anemia     Resolved, Hgb 12.0 11/03/12    Laceration of skin of forehead - Primary     Sustained from falling at [facility] 11/04/12, s/p ED evaluation, had CT head at ED showed no acute changes, suture closure for the laceration on the right forehead above the lateral eyebrow intact. Mild edema and erythema to the are seen. Suture will be removed in 5-7 days.     Progressive supranuclear palsy     Frequent falling, seeing Neurology regularly, staff reported the patient has difficulty looking down followed by the upgaze palsy and double vision at [facility]--will continue to observe the patient.        Review of Systems:  Review of Systems  Constitutional: Negative for fever, chills, weight loss, malaise/fatigue and diaphoresis.  HENT: Positive for hearing loss. Negative for ear pain, nosebleeds, congestion, sore throat, neck pain, tinnitus and ear discharge.   Eyes: Positive for double vision (?). Negative for blurred vision, photophobia, pain, discharge and redness.       ? supranuclear ophthalmoplegia  Respiratory: Negative for cough, hemoptysis, sputum production, shortness of breath, wheezing and stridor.   Cardiovascular: Negative for chest pain, palpitations, orthopnea, claudication, leg swelling and PND.  Gastrointestinal: Negative for heartburn, nausea, vomiting, abdominal pain, diarrhea, constipation, blood in stool and melena.  Genitourinary: Positive for frequency. Negative  for dysuria, urgency, hematuria and flank pain.  Musculoskeletal: Positive for falls. Negative for myalgias, back pain and joint pain.  Skin: Negative for itching and rash.  Neurological: Negative for dizziness, tingling, tremors, sensory change, speech change, focal weakness, seizures, loss of consciousness, weakness and headaches.  Endo/Heme/Allergies: Negative for environmental allergies and polydipsia. Does not bruise/bleed easily.  Psychiatric/Behavioral: Positive for memory loss. Negative for depression, suicidal ideas, hallucinations and substance abuse. The patient is not nervous/anxious and does not have insomnia.      Past Medical History  Diagnosis Date  . Cerebral degeneration   . Ataxia   . Gait disorder   . Progressive supranuclear palsy   . Subdural hematoma     Hx. of a left frontal  . Cancer     Breast   Past Surgical History  Procedure Laterality Date  . Mastectomy    . Tonsillectomy    . Cataract extraction, bilateral     Social History:   reports that she quit smoking about 47 years ago. Her smoking use included Cigarettes. She smoked 0.00 packs per day. She does not have any smokeless tobacco history on file. She reports that she does not drink alcohol. Her drug history is not on file.  Family History  Problem Relation Age of Onset  . Other Mother     "Old Age"  . Cancer Father   . Cancer Brother     Colon    Medications: Patient's Medications  New Prescriptions   No medications on file  Previous Medications   CALCIUM CARB-CHOLECALCIFEROL (CALTRATE 600+D) 600-800  MG-UNIT TABS    Take 1 tablet by mouth.   MULTIVITAMIN-IRON-MINERALS-FOLIC ACID (CENTRUM) CHEWABLE TABLET    Chew 1 tablet by mouth daily.   POLYVINYL ALCOHOL (LIQUIFILM TEARS) 1.4 % OPHTHALMIC SOLUTION    1 drop as needed.  Modified Medications   No medications on file  Discontinued Medications   No medications on file     Physical Exam: Physical Exam  Constitutional: She is  oriented to person, place, and time. She appears well-developed and well-nourished. No distress.  HENT:  Head: Normocephalic.  Right Ear: External ear normal.  Left Ear: External ear normal.  Nose: Nose normal.  Mouth/Throat: Oropharynx is clear and moist. No oropharyngeal exudate.  Eyes: Conjunctivae are normal. Right eye exhibits no discharge. Left eye exhibits no discharge. No scleral icterus.  Difficulty looking down followed by an upgaze ?palsy  Neck: Normal range of motion. Neck supple. No JVD present. No tracheal deviation present. No thyromegaly present.  Cardiovascular: Normal rate, regular rhythm, normal heart sounds and intact distal pulses.   No murmur heard. Pulmonary/Chest: Effort normal and breath sounds normal. No stridor. No respiratory distress. She has no wheezes. She has no rales. She exhibits no tenderness.  Abdominal: Soft. Bowel sounds are normal. She exhibits no distension. There is no tenderness. There is no rebound and no guarding.  Musculoskeletal: Normal range of motion. She exhibits no edema and no tenderness.  Lymphadenopathy:    She has no cervical adenopathy.  Neurological: She is alert and oriented to person, place, and time. She displays normal reflexes. A cranial nerve deficit is present. She exhibits normal muscle tone. Coordination abnormal.  Skin: Skin is warm and dry. No rash noted. She is not diaphoretic. No erythema. No pallor.  The right forehead skin laceration with suture closure above the lateral right eyebrow  Psychiatric: She has a normal mood and affect. Her speech is normal and behavior is normal. Judgment and thought content normal. Cognition and memory are impaired. She does not express impulsivity or inappropriate judgment. She exhibits abnormal recent memory. She exhibits normal remote memory.    Filed Vitals:   11/07/12 1057  BP: 114/60  Pulse: 74  Temp: 99.1 F (37.3 C)  TempSrc: Tympanic  Resp: 18      Labs reviewed: Basic  Metabolic Panel:  Recent Labs  11/91/47  NA 135*  K 4.2  BUN 15  CREATININE 0.7    CBC:  Recent Labs  11/03/12  WBC 8.0  HGB 12.0  HCT 36  PLT 313     Past Procedures:  11/04/12 CT head w/o CM:  IMPRESSION:   1.  No evidence of acute intracranial disease. 2.  Right periorbital laceration and contusion without evidence of global injury or fracture.   Assessment/Plan Laceration of skin of forehead Sustained from falling at [facility] 11/04/12, s/p ED evaluation, had CT head at ED showed no acute changes, suture closure for the laceration on the right forehead above the lateral eyebrow intact. Mild edema and erythema to the are seen. Suture will be removed in 5-7 days.   Progressive supranuclear palsy Frequent falling, seeing Neurology regularly, staff reported the patient has difficulty looking down followed by the upgaze palsy and double vision at [facility]--will continue to observe the patient.   Anemia Resolved, Hgb 12.0 11/03/12    Family/ Staff Communication: observe the patient.   Goals of Care: AL  Labs/tests ordered: none

## 2012-11-07 NOTE — Assessment & Plan Note (Signed)
Frequent falling, seeing Neurology regularly, staff reported the patient has difficulty looking down followed by the upgaze palsy and double vision at [facility]--will continue to observe the patient.

## 2012-11-07 NOTE — Assessment & Plan Note (Signed)
Resolved, Hgb 12.0 11/03/12   

## 2012-11-07 NOTE — Assessment & Plan Note (Addendum)
Sustained from falling at [facility] 11/04/12, s/p ED evaluation, had CT head at ED showed no acute changes, suture closure for the laceration on the right forehead above the lateral eyebrow intact. Mild edema and erythema to the are seen. Suture will be removed in 5-7 days.

## 2012-11-28 ENCOUNTER — Encounter: Payer: Self-pay | Admitting: *Deleted

## 2013-01-16 ENCOUNTER — Emergency Department (HOSPITAL_COMMUNITY)
Admission: EM | Admit: 2013-01-16 | Discharge: 2013-01-16 | Disposition: A | Discharge status: Home / Self Care | Payer: Medicare Other | Attending: Emergency Medicine | Admitting: Emergency Medicine

## 2013-01-16 ENCOUNTER — Emergency Department (HOSPITAL_COMMUNITY): Payer: Medicare Other

## 2013-01-16 ENCOUNTER — Non-Acute Institutional Stay: Payer: Medicare Other | Admitting: Nurse Practitioner

## 2013-01-16 ENCOUNTER — Encounter (HOSPITAL_COMMUNITY): Payer: Self-pay | Admitting: Emergency Medicine

## 2013-01-16 DIAGNOSIS — Y939 Activity, unspecified: Secondary | ICD-10-CM | POA: Insufficient documentation

## 2013-01-16 DIAGNOSIS — G238 Other specified degenerative diseases of basal ganglia: Secondary | ICD-10-CM

## 2013-01-16 DIAGNOSIS — Y929 Unspecified place or not applicable: Secondary | ICD-10-CM | POA: Insufficient documentation

## 2013-01-16 DIAGNOSIS — W19XXXA Unspecified fall, initial encounter: Secondary | ICD-10-CM

## 2013-01-16 DIAGNOSIS — S0101XA Laceration without foreign body of scalp, initial encounter: Secondary | ICD-10-CM

## 2013-01-16 DIAGNOSIS — W1809XA Striking against other object with subsequent fall, initial encounter: Secondary | ICD-10-CM | POA: Insufficient documentation

## 2013-01-16 DIAGNOSIS — N390 Urinary tract infection, site not specified: Secondary | ICD-10-CM

## 2013-01-16 DIAGNOSIS — Z87891 Personal history of nicotine dependence: Secondary | ICD-10-CM | POA: Insufficient documentation

## 2013-01-16 DIAGNOSIS — Z79899 Other long term (current) drug therapy: Secondary | ICD-10-CM | POA: Insufficient documentation

## 2013-01-16 DIAGNOSIS — F0390 Unspecified dementia without behavioral disturbance: Secondary | ICD-10-CM | POA: Insufficient documentation

## 2013-01-16 DIAGNOSIS — T148XXA Other injury of unspecified body region, initial encounter: Secondary | ICD-10-CM

## 2013-01-16 DIAGNOSIS — S0100XA Unspecified open wound of scalp, initial encounter: Secondary | ICD-10-CM | POA: Insufficient documentation

## 2013-01-16 DIAGNOSIS — W19XXXD Unspecified fall, subsequent encounter: Secondary | ICD-10-CM

## 2013-01-16 DIAGNOSIS — Z853 Personal history of malignant neoplasm of breast: Secondary | ICD-10-CM | POA: Insufficient documentation

## 2013-01-16 DIAGNOSIS — G231 Progressive supranuclear ophthalmoplegia [Steele-Richardson-Olszewski]: Secondary | ICD-10-CM

## 2013-01-16 DIAGNOSIS — D649 Anemia, unspecified: Secondary | ICD-10-CM

## 2013-01-16 NOTE — ED Notes (Signed)
Bed: WA13 Expected date:  Expected time:  Means of arrival:  Comments: EMS-fall 

## 2013-01-16 NOTE — ED Notes (Signed)
Per EMS-unwitnessed fall-patient states she hit head-small head laceration, bleeding controlled-abrasion left thigh-old bruise/hematoma on back, not related to fall-patient does not remember falling-no complaints of pain

## 2013-01-16 NOTE — ED Provider Notes (Addendum)
CSN: [id number]     Arrival date & time 01/16/13  0741 History   First MD Initiated Contact with Patient 01/16/13 [phone]     Chief Complaint  Patient presents with  . Fall    HPI Per EMS-unwitnessed fall-patient states she hit head-small head laceration, bleeding controlled-abrasion left thigh-old bruise/hematoma on back, not related to fall-patient does not remember falling-no complaints of pain  Past Medical History  Diagnosis Date  . Cerebral degeneration   . Ataxia   . Gait disorder   . Progressive supranuclear palsy   . Subdural hematoma     Hx. of a left frontal  . Cancer     Breast   Past Surgical History  Procedure Laterality Date  . Mastectomy    . Tonsillectomy    . Cataract extraction, bilateral    . Breast surgery     Family History  Problem Relation Age of Onset  . Other Mother     "Old Age"  . Cancer Father   . Cancer Brother     Colon   History  Substance Use Topics  . Smoking status: Former Smoker    Types: Cigarettes    Quit date: 05/04/1965  . Smokeless tobacco: Not on file  . Alcohol Use: No     Comment: Quit drinking   OB History   Grav Para Term Preterm Abortions TAB SAB Ect Mult Living                 Review of Systems  Unable to perform ROS: Dementia    Allergies  Review of patient's allergies indicates no known allergies.  Home Medications   Current Outpatient Rx  Name  Route  Sig  Dispense  Refill  . Calcium Carb-Cholecalciferol (CALTRATE 600+D) 600-800 MG-UNIT TABS   Oral   Take 1 tablet by mouth.         . ciprofloxacin (CIPRO) 500 MG tablet   Oral   Take 500 mg by mouth 2 (two) times daily. For 7 days. Stop 01/21/13         . multivitamin-iron-minerals-folic acid (CENTRUM) chewable tablet   Oral   Chew 1 tablet by mouth daily.         . polyvinyl alcohol (LIQUIFILM TEARS) 1.4 % ophthalmic solution      1 drop as needed. Use as needed, wait 3-5 minutes between eye drops.          BP 136/69  Pulse 67   Temp(Src) 98.1 F (36.7 C) (Oral)  Resp 16  SpO2 96% Physical Exam  Nursing note and vitals reviewed. Constitutional: She is oriented to person, place, and time. She appears well-developed and well-nourished. No distress.  HENT:  Head: Normocephalic and atraumatic.    Small abrasion/laceration with no active bleeding.  Eyes: Pupils are equal, round, and reactive to light.  Neck: Normal range of motion.  Cardiovascular: Normal rate and intact distal pulses.   Pulmonary/Chest: No respiratory distress.  Abdominal: Normal appearance. She exhibits no distension.  Musculoskeletal: Normal range of motion.       Right hip: She exhibits normal range of motion, no tenderness and no bony tenderness.       Left hip: She exhibits normal range of motion, no tenderness and no bony tenderness.  Neurological: She is alert and oriented to person, place, and time. No cranial nerve deficit.  Skin: Skin is warm and dry. No rash noted.  Psychiatric: She has a normal mood and affect. Her behavior is normal.  ED Course  Procedures (including critical care time) Labs Review Labs Reviewed - No data to display Imaging Review No results found.  MDM   1. Laceration of scalp, initial encounter   2. Fall, initial encounter       Nelia Shi, MD 01/16/13 4098  Nelia Shi, MD 01/30/13 [phone]

## 2013-01-16 NOTE — ED Notes (Signed)
PT[facility] called for transport to [facility] by [facility]

## 2013-01-16 NOTE — ED Notes (Signed)
PTAR called for transport back to [facility] Monica Waller 

## 2013-01-18 ENCOUNTER — Encounter: Payer: Self-pay | Admitting: Nurse Practitioner

## 2013-01-18 DIAGNOSIS — N390 Urinary tract infection, site not specified: Secondary | ICD-10-CM | POA: Insufficient documentation

## 2013-01-18 DIAGNOSIS — W19XXXA Unspecified fall, initial encounter: Secondary | ICD-10-CM | POA: Insufficient documentation

## 2013-01-18 DIAGNOSIS — T148XXA Other injury of unspecified body region, initial encounter: Secondary | ICD-10-CM | POA: Insufficient documentation

## 2013-01-18 NOTE — Assessment & Plan Note (Signed)
Frequent falling due to her supranuclear palsy and her lack of safety awareness--close supervision needed.

## 2013-01-18 NOTE — Assessment & Plan Note (Signed)
The left parietal and left lower middle back, c/o localized pain. ED evaluation 01/16/13 CT HEAD WITHOUT CONTRAST IMPRESSION: Atrophy and chronic microvascular ischemia. No acute abnormality

## 2013-01-18 NOTE — Assessment & Plan Note (Addendum)
Urine culture 01/15/13 showed E. Coli >100,000c/ml, 7 day course of Cipro 500 mg bid started 01/15/13 subsequently. She sustained the left parietal and left lower mid back hematomas from fall 01/16/13. ER evaluation 01/16/13. EXAM: CT HEAD WITHOUT CONTRAST,  IMPRESSION: Atrophy and chronic microvascular ischemia. No acute abnormality

## 2013-01-18 NOTE — Progress Notes (Signed)
Patient ID: Monica Waller, female   DOB: 1924-04-04, 79 y.o.   MRN: [id number] Code Status: DNR  No Known Allergies  Chief Complaint  Patient presents with  . Medical Managment of Chronic Issues    UTI, S/P falll, the right parietal and the left lower middle back hematoms.                                                                                                                          k    HPI: Patient is a 79 y.o. female seen in the AL at [facility] today for evaluation of UTI, s/p ED evaluation for the right parietal hematoma and s/p fall, and her other chronic medical conditions.  Problem List Items Addressed This Visit   Anemia     Resolved, Hgb 12.0 11/03/12      Fall     Frequent falling due to her supranuclear palsy and her lack of safety awareness--close supervision needed.     Hematoma     The left parietal and left lower middle back, c/o localized pain. ED evaluation 01/16/13 CT HEAD WITHOUT CONTRAST IMPRESSION: Atrophy and chronic microvascular ischemia. No acute abnormality     Progressive supranuclear palsy     Frequent falling, seeing Neurology regularly, staff reported the patient has difficulty looking down followed by the upgaze palsy and double vision at [facility]--will continue to observe the patient.       UTI (lower urinary tract infection) - Primary     Urine culture 01/15/13 showed E. Coli >100,000c/ml, 7 day course of Cipro 500 mg bid started 01/15/13 subsequently. She sustained the left parietal and left lower mid back hematomas from fall 01/16/13. ER evaluation 01/16/13. EXAM: CT HEAD WITHOUT CONTRAST,  IMPRESSION: Atrophy and chronic microvascular ischemia. No acute abnormality         Review of Systems:  Review of Systems  Constitutional: Negative for fever, chills, weight loss, malaise/fatigue and diaphoresis.  HENT: Positive for hearing loss. Negative for ear pain, nosebleeds, congestion, sore throat, neck pain, tinnitus and ear discharge.   Eyes:  Positive for double vision (?). Negative for blurred vision, photophobia, pain, discharge and redness.       ? supranuclear ophthalmoplegia  Respiratory: Negative for cough, hemoptysis, sputum production, shortness of breath, wheezing and stridor.   Cardiovascular: Negative for chest pain, palpitations, orthopnea, claudication, leg swelling and PND.  Gastrointestinal: Negative for heartburn, nausea, vomiting, abdominal pain, diarrhea, constipation, blood in stool and melena.  Genitourinary: Positive for frequency. Negative for dysuria, urgency, hematuria and flank pain.  Musculoskeletal: Positive for falls. Negative for myalgias, back pain and joint pain.  Skin: Negative for itching and rash.       The right parietal hematoma. The left lower middle back hematoma.   Neurological: Negative for dizziness, tingling, tremors, sensory change, speech change, focal weakness, seizures, loss of consciousness, weakness and headaches.  Endo/Heme/Allergies: Negative for environmental allergies and polydipsia. Does not bruise/bleed easily.  Psychiatric/Behavioral: Positive for memory loss. Negative for depression, suicidal ideas, hallucinations and substance abuse. The patient is not nervous/anxious and does not have insomnia.      Past Medical History  Diagnosis Date  . Cerebral degeneration   . Ataxia   . Gait disorder   . Progressive supranuclear palsy   . Subdural hematoma     Hx. of a left frontal  . Cancer     Breast   Past Surgical History  Procedure Laterality Date  . Mastectomy    . Tonsillectomy    . Cataract extraction, bilateral    . Breast surgery     Social History:   reports that she quit smoking about 47 years ago. Her smoking use included Cigarettes. She smoked 0.00 packs per day. She does not have any smokeless tobacco history on file. She reports that she does not drink alcohol. Her drug history is not on file.  Family History  Problem Relation Age of Onset  . Other Mother      "Old Age"  . Cancer Father   . Cancer Brother     Colon    Medications: Patient's Medications  New Prescriptions   No medications on file  Previous Medications   CALCIUM CARB-CHOLECALCIFEROL (CALTRATE 600+D) 600-800 MG-UNIT TABS    Take 1 tablet by mouth.   CIPROFLOXACIN (CIPRO) 500 MG TABLET    Take 500 mg by mouth 2 (two) times daily. For 7 days. Stop 01/21/13   MULTIVITAMIN-IRON-MINERALS-FOLIC ACID (CENTRUM) CHEWABLE TABLET    Chew 1 tablet by mouth daily.   POLYVINYL ALCOHOL (LIQUIFILM TEARS) 1.4 % OPHTHALMIC SOLUTION    1 drop as needed. Use as needed, wait 3-5 minutes between eye drops.  Modified Medications   No medications on file  Discontinued Medications   No medications on file     Physical Exam: Physical Exam  Constitutional: She is oriented to person, place, and time. She appears well-developed and well-nourished. No distress.  HENT:  Head: Normocephalic.  Right Ear: External ear normal.  Left Ear: External ear normal.  Nose: Nose normal.  Mouth/Throat: Oropharynx is clear and moist. No oropharyngeal exudate.  Eyes: Conjunctivae are normal. Right eye exhibits no discharge. Left eye exhibits no discharge. No scleral icterus.  Difficulty looking down followed by an upgaze ?palsy  Neck: Normal range of motion. Neck supple. No JVD present. No tracheal deviation present. No thyromegaly present.  Cardiovascular: Normal rate, regular rhythm, normal heart sounds and intact distal pulses.   No murmur heard. Pulmonary/Chest: Effort normal and breath sounds normal. No stridor. No respiratory distress. She has no wheezes. She has no rales. She exhibits no tenderness.  Abdominal: Soft. Bowel sounds are normal. She exhibits no distension. There is no tenderness. There is no rebound and no guarding.  Musculoskeletal: Normal range of motion. She exhibits no edema and no tenderness.  Lymphadenopathy:    She has no cervical adenopathy.  Neurological: She is alert and oriented  to person, place, and time. She displays normal reflexes. A cranial nerve deficit is present. She exhibits normal muscle tone. Coordination abnormal.  Skin: Skin is warm and dry. No rash noted. She is not diaphoretic. No erythema. No pallor.  The right forehead skin laceration with suture closure above the lateral right eyebrow-healed(10/2012). The right parietal hematoma and the left lower middle back hematoma.   Psychiatric: She has a normal mood and affect. Her speech is normal and behavior is normal. Judgment and thought content normal. Cognition and memory are  impaired. She does not express impulsivity or inappropriate judgment. She exhibits abnormal recent memory. She exhibits normal remote memory.    Filed Vitals:   01/16/13 1125  BP: 131/67  Pulse: 74  Temp: 97.4 F (36.3 C)  TempSrc: Tympanic  Resp: 18    Labs reviewed: Basic Metabolic Panel:  Recent Labs  16/10/96  NA 135*  K 4.2  BUN 15  CREATININE 0.7   CBC:  Recent Labs  11/03/12  WBC 8.0  HGB 12.0  HCT 36  PLT 313   Past Procedures:  01/16/13 CT head Wo Contrast:   IMPRESSION:  Atrophy and chronic microvascular ischemia. No acute abnormality    Assessment/Plan UTI (lower urinary tract infection) Urine culture 01/15/13 showed E. Coli >100,000c/ml, 7 day course of Cipro 500 mg bid started 01/15/13 subsequently. She sustained the left parietal and left lower mid back hematomas from fall 01/16/13. ER evaluation 01/16/13. EXAM: CT HEAD WITHOUT CONTRAST,  IMPRESSION: Atrophy and chronic microvascular ischemia. No acute abnormality    Fall Frequent falling due to her supranuclear palsy and her lack of safety awareness--close supervision needed.   Hematoma The left parietal and left lower middle back, c/o localized pain. ED evaluation 01/16/13 CT HEAD WITHOUT CONTRAST IMPRESSION: Atrophy and chronic microvascular ischemia. No acute abnormality   Progressive supranuclear palsy Frequent falling, seeing Neurology  regularly, staff reported the patient has difficulty looking down followed by the upgaze palsy and double vision at [facility]--will continue to observe the patient.     Anemia Resolved, Hgb 12.0 11/03/12      Family/ Staff Communication: observe the patient.   Goals of Care: AL  Labs/tests ordered: none

## 2013-01-18 NOTE — Assessment & Plan Note (Signed)
Resolved, Hgb 12.0 11/03/12

## 2013-01-18 NOTE — Assessment & Plan Note (Signed)
Frequent falling, seeing Neurology regularly, staff reported the patient has difficulty looking down followed by the upgaze palsy and double vision at [facility]--will continue to observe the patient.  

## 2013-02-17 ENCOUNTER — Non-Acute Institutional Stay: Payer: Medicare Other | Admitting: Internal Medicine

## 2013-02-17 ENCOUNTER — Encounter: Payer: Self-pay | Admitting: Internal Medicine

## 2013-02-17 VITALS — BP 120/62 | HR 60 | Ht 65.25 in | Wt 118.0 lb

## 2013-02-17 DIAGNOSIS — I6789 Other cerebrovascular disease: Secondary | ICD-10-CM

## 2013-02-17 DIAGNOSIS — W19XXXD Unspecified fall, subsequent encounter: Secondary | ICD-10-CM

## 2013-02-17 DIAGNOSIS — G231 Progressive supranuclear ophthalmoplegia [Steele-Richardson-Olszewski]: Secondary | ICD-10-CM

## 2013-02-17 DIAGNOSIS — R269 Unspecified abnormalities of gait and mobility: Secondary | ICD-10-CM

## 2013-02-17 DIAGNOSIS — R1312 Dysphagia, oropharyngeal phase: Secondary | ICD-10-CM

## 2013-02-17 DIAGNOSIS — G238 Other specified degenerative diseases of basal ganglia: Secondary | ICD-10-CM

## 2013-02-17 DIAGNOSIS — W19XXXA Unspecified fall, initial encounter: Secondary | ICD-10-CM

## 2013-02-17 DIAGNOSIS — G311 Senile degeneration of brain, not elsewhere classified: Secondary | ICD-10-CM

## 2013-02-17 NOTE — Progress Notes (Signed)
Subjective:    Patient ID: Monica Waller, female    DOB: [birth year], 79 y.o.   MRN: [id number]  Chief Complaint  Patient presents with  . Medical Managment of Chronic Issues    gait disorder, muscle weakness, brain atrophy    HPI  Abnormality of gait: Chronic and present at least 3 years  Fall, subsequent encounter: Unstable gait due to multiple neurologic factors  Progressive supranuclear palsy: Unchanged  Dysphagia, oropharyngeal phase: Improved  Other generalized ischemic cerebrovascular disease: Noted on previous brain scans  Senile degeneration of brain: Atrophy noted on previous scans  Gait disorder: Multiple neurologic factors contribute    Current Outpatient Prescriptions on File Prior to Visit  Medication Sig Dispense Refill  . Calcium Carb-Cholecalciferol (CALTRATE 600+D) 600-800 MG-UNIT TABS Take 1 tablet by mouth.      . multivitamin-iron-minerals-folic acid (CENTRUM) chewable tablet Chew 1 tablet by mouth daily.      . polyvinyl alcohol (LIQUIFILM TEARS) 1.4 % ophthalmic solution 1 drop as needed. Use as needed, wait 3-5 minutes between eye drops.       No current facility-administered medications on file prior to visit.    Review of Systems  Constitutional: Positive for fatigue.  HENT: Positive for hearing loss. Negative for ear pain.   Eyes: Positive for visual disturbance (Corrective lenses).  Respiratory: Negative.   Cardiovascular: Negative for chest pain, palpitations and leg swelling.  Gastrointestinal: Negative.   Endocrine: Negative for cold intolerance, heat intolerance, polydipsia and polyphagia.  Genitourinary: Negative.   Musculoskeletal: Positive for gait problem.       Unstable gait. History of multiple falls.  Skin:       History right mastectomy for breast cancer  Neurological:       Progressive supranuclear palsy. Ataxia. Unstable gait. Clumsy right leg and foot.  Hematological: Negative.   Psychiatric/Behavioral: Negative.       Objective:BP 120/62  Pulse 60  Ht 5' 5.25" (1.657 m)  Wt 118 lb (53.524 kg)  BMI 19.49 kg/m2    Physical Exam  Constitutional: She is oriented to person, place, and time. She appears well-developed and well-nourished. No distress.  HENT:  Head: Normocephalic and atraumatic.  Right Ear: External ear normal.  Left Ear: External ear normal.  Nose: Nose normal.  Eyes: EOM are normal. Pupils are equal, round, and reactive to light.  Neck: No JVD present. No tracheal deviation present. No thyromegaly present.  Cardiovascular: Normal rate, regular rhythm, normal heart sounds and intact distal pulses.  Exam reveals no gallop and no friction rub.   No murmur heard. Pulmonary/Chest: No respiratory distress. She has no wheezes. She has no rales. She exhibits no tenderness.  Abdominal: She exhibits no distension and no mass. There is no tenderness.  Musculoskeletal: Normal range of motion. She exhibits no edema and no tenderness.  Gait very unstable and wobbly. Difficulty keeping balanced. Manifests a scissor gait.  Lymphadenopathy:    She has no cervical adenopathy.  Neurological: She is alert and oriented to person, place, and time. She has normal reflexes. No cranial nerve deficit. Coordination (Ataxic movements) abnormal.  Normal sensation to touch and vibratory.  Skin: No rash noted. She is not diaphoretic. No erythema. No pallor.  Psychiatric: She has a normal mood and affect. Her behavior is normal. Judgment and thought content normal.          Assessment & Plan:  Abnormality of gait: Related to neurodegenerative diseases  Fall, subsequent encounter: Patient is to see previous counseling  Progressive supranuclear palsy: Unchanged  Dysphagia, oropharyngeal phase: Improved  Other generalized ischemic cerebrovascular disease: Observe  Senile degeneration of brain: Observe  Gait disorder: Use walker and safe behaviors to reduce falls

## 2013-02-22 ENCOUNTER — Encounter: Payer: Self-pay | Admitting: Internal Medicine

## 2013-02-22 DIAGNOSIS — C50919 Malignant neoplasm of unspecified site of unspecified female breast: Secondary | ICD-10-CM | POA: Insufficient documentation

## 2013-02-22 DIAGNOSIS — R269 Unspecified abnormalities of gait and mobility: Secondary | ICD-10-CM | POA: Insufficient documentation

## 2013-02-22 DIAGNOSIS — R1312 Dysphagia, oropharyngeal phase: Secondary | ICD-10-CM | POA: Insufficient documentation

## 2013-02-22 NOTE — Patient Instructions (Signed)
Continue current medications. 

## 2013-02-27 ENCOUNTER — Encounter: Payer: Self-pay | Admitting: Neurology

## 2013-02-27 ENCOUNTER — Ambulatory Visit (INDEPENDENT_AMBULATORY_CARE_PROVIDER_SITE_OTHER): Payer: Medicare Other | Admitting: Neurology

## 2013-02-27 VITALS — BP 124/60 | HR 60

## 2013-02-27 DIAGNOSIS — R269 Unspecified abnormalities of gait and mobility: Secondary | ICD-10-CM

## 2013-02-27 DIAGNOSIS — G231 Progressive supranuclear ophthalmoplegia [Steele-Richardson-Olszewski]: Secondary | ICD-10-CM

## 2013-02-27 DIAGNOSIS — G238 Other specified degenerative diseases of basal ganglia: Secondary | ICD-10-CM

## 2013-02-27 NOTE — Progress Notes (Signed)
Reason for visit: Progressive supranuclear palsy  Monica Waller is an 79 y.o. female  History of present illness:  Monica Waller is an 79 year old left-handed white female with a history of progressive supranuclear palsy. The patient continues to fall on occasion. The patient will walk on average twice a week with a walker, and someone behind her with a PT belt. The patient will fall when she tries to lean over to pick up something from her wheelchair, or pick up something off of her nightstand. The patient has recently fallen and bumped her head. The patient denies any problems with swallowing. The patient reports that she has no problems watching TV, but she is unable to read a newspaper or above. The patient is doing well with her memory issues. The patient has had some problems with her eyes with dryness, and she will be going for eyelid surgery in the near future. The patient denies any episodes of eye opening apraxia.  Past Medical History  Diagnosis Date  . Cerebral degeneration   . Ataxia   . Gait disorder   . Progressive supranuclear palsy   . Subdural hematoma     Hx. of a left frontal  . Cancer     Breast  . Malignant neoplasm of breast (female), unspecified site   . 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 Other generalized ischemic cerebrovascular disease 10/14/09    Noted on CT and MR brain 10/14/09  . Dysphagia, oropharyngeal phase     Past Surgical History  Procedure Laterality Date  . Mastectomy Right 1998    Dr. Maryagnes Amos  . Tonsillectomy    . Cataract extraction, bilateral      Family History  Problem Relation Age of Onset  . Other Mother     "Old Age"  . Cancer Father   . Cancer Brother     Colon    Social history:  reports that she quit smoking about 47 years ago. Her smoking use included Cigarettes. She smoked 0.00 packs per day. She has never used smokeless tobacco. She reports that she does not drink alcohol or  use illicit drugs.   No Known Allergies  Medications:  Current Outpatient Prescriptions on File Prior to Visit  Medication Sig Dispense Refill  . Calcium Carb-Cholecalciferol (CALTRATE 600+D) 600-800 MG-UNIT TABS Take 1 tablet by mouth.      . multivitamin-iron-minerals-folic acid (CENTRUM) chewable tablet Chew 1 tablet by mouth daily.      . polyvinyl alcohol (LIQUIFILM TEARS) 1.4 % ophthalmic solution 1 drop as needed. Use as needed, wait 3-5 minutes between eye drops.       No current facility-administered medications on file prior to visit.    ROS:  Out of a complete 14 system review of symptoms, the patient complains only of the following symptoms, and all other reviewed systems are negative.  Blurred vision, double vision Easy bruising Gait disturbance, falls  Blood pressure 124/60, pulse 60, weight 0 lb (0 kg).  Physical Exam  General: The patient is alert and cooperative at the time of the examination.  Skin: No significant peripheral edema is noted.   Neurologic Exam  Mental status: The Mini-Mental status examination done today shows a total score 28/30.  Cranial nerves: Facial symmetry is present. The patient has decreased blink with the left eye. The patient has a masked face, wide eyed stare. Speech is normal, no aphasia or dysarthria is noted.  Extraocular movements are full. Visual fields are full.  Motor: The patient has good strength in all 4 extremities.  Sensory examination: Soft touch sensation is symmetric on the face, arms, and legs.  Coordination: The patient has good finger-nose-finger and heel-to-shin bilaterally.  Gait and station: The patient requires assistance with standing. Once up, the patient does have a tendency to lean backwards, but she is able to stand by herself. With assistance, she is able to ambulate short distances, feet are quite narrow together, almost with a tandem gait pattern. The patient will tend to lean from side to side. The  patient is unable to ambulate independently.  Reflexes: Deep tendon reflexes are symmetric.   Assessment/Plan:  One. Progressive supranuclear palsy  2. Gait disturbance  3. Mild memory disturbance  The patient is doing relatively well at this point, but she continues to fall when she tries to stoop over to pick up something. The patient may benefit from using a "reacher" so that she does not have to bend over or stoop. The patient is walking with assistance, and this is reasonable. The patient is not attempting to walk independently. Safety issues are of upmost importance, and I have stressed this to the patient. The patient will followup in 6 months.  Marlan Palau MD 03/01/2013 7:59 AM  [facility] [address] [place], [place] [address]  Phone [phone] Fax [phone]

## 2013-08-04 ENCOUNTER — Encounter: Payer: Self-pay | Admitting: *Deleted

## 2013-08-18 ENCOUNTER — Encounter: Payer: Self-pay | Admitting: Internal Medicine

## 2013-08-18 ENCOUNTER — Non-Acute Institutional Stay: Payer: Medicare Other | Admitting: Internal Medicine

## 2013-08-18 VITALS — BP 120/66 | HR 62 | Wt 123.0 lb

## 2013-08-18 DIAGNOSIS — R269 Unspecified abnormalities of gait and mobility: Secondary | ICD-10-CM

## 2013-08-18 DIAGNOSIS — G238 Other specified degenerative diseases of basal ganglia: Secondary | ICD-10-CM

## 2013-08-18 DIAGNOSIS — H01009 Unspecified blepharitis unspecified eye, unspecified eyelid: Secondary | ICD-10-CM | POA: Insufficient documentation

## 2013-08-18 DIAGNOSIS — G231 Progressive supranuclear ophthalmoplegia [Steele-Richardson-Olszewski]: Secondary | ICD-10-CM

## 2013-08-18 DIAGNOSIS — R1312 Dysphagia, oropharyngeal phase: Secondary | ICD-10-CM

## 2013-08-18 MED ORDER — ERYTHROMYCIN 5 MG/GM OP OINT: TOPICAL_OINTMENT | OPHTHALMIC | Status: DC

## 2013-08-18 NOTE — Progress Notes (Signed)
Patient ID: Monica Waller, female   DOB: Jan 04, 1924, 80 y.o.   MRN: [id number]    Location:  [facility]  Place of Service: CLINIC    No Known Allergies  Chief Complaint  Patient presents with  . Medical Management of Chronic Issues    gait disorder, supranuclear palsy    HPI:  Progressive supranuclear palsy: unchanged  Dysphagia, oropharyngeal phase: coughs at meals  Gait disorder: must be assisted any time she is out of her wheelchair  Blepharitis: crusty material along upper eyelids and on the sutures lateral to the eyes.    Medications: Patient's Medications  New Prescriptions   No medications on file  Previous Medications   CALCIUM CARB-CHOLECALCIFEROL (CALTRATE 600+D) 600-800 MG-UNIT TABS    Take 1 tablet by mouth.   MULTIVITAMIN-IRON-MINERALS-FOLIC ACID (CENTRUM) CHEWABLE TABLET    Chew 1 tablet by mouth daily.   POLYVINYL ALCOHOL (LIQUIFILM TEARS) 1.4 % OPHTHALMIC SOLUTION    1 drop as needed. Use as needed, wait 3-5 minutes between eye drops.  Modified Medications   No medications on file  Discontinued Medications   No medications on file     Review of Systems  Constitutional: Positive for fatigue.  HENT: Positive for hearing loss. Negative for ear pain.   Eyes: Positive for visual disturbance (Corrective lenses).       Eye surgery 07/09/13. Dissolvable sutures at lateral aspect of both eyes. Some crusty adherent material to both sutures.  Respiratory: Negative.   Cardiovascular: Negative for chest pain, palpitations and leg swelling.  Gastrointestinal: Negative.   Endocrine: Negative for cold intolerance, heat intolerance, polydipsia and polyphagia.  Genitourinary: Negative.   Musculoskeletal: Positive for gait problem.       Unstable gait. History of multiple falls.  Skin:       History right mastectomy for breast cancer  Neurological:       Progressive supranuclear palsy. Ataxia. Unstable gait. Clumsy right leg and foot.  Hematological: Negative.     Psychiatric/Behavioral: Negative.     Filed Vitals:   08/18/13 1050  BP: 120/66  Pulse: 62  Weight: 123 lb (55.792 kg)   Physical Exam  Constitutional: She is oriented to person, place, and time. She appears well-developed and well-nourished. No distress.  HENT:  Head: Normocephalic and atraumatic.  Right Ear: External ear normal.  Left Ear: External ear normal.  Nose: Nose normal.  Eyes: EOM are normal. Pupils are equal, round, and reactive to light.  Sutures lateral  To both eyes with crusty material adherent to sutures.  Neck: No JVD present. No tracheal deviation present. No thyromegaly present.  Cardiovascular: Normal rate, regular rhythm, normal heart sounds and intact distal pulses.  Exam reveals no gallop and no friction rub.   No murmur heard. Pulmonary/Chest: No respiratory distress. She has no wheezes. She has no rales. She exhibits no tenderness.  Abdominal: She exhibits no distension and no mass. There is no tenderness.  Musculoskeletal: Normal range of motion. She exhibits no edema and no tenderness.  Gait very unstable and wobbly. Difficulty keeping balanced. Manifests a scissor gait.  Lymphadenopathy:    She has no cervical adenopathy.  Neurological: She is alert and oriented to person, place, and time. She has normal reflexes. No cranial nerve deficit. Coordination (Ataxic movements) abnormal.  Normal sensation to touch and vibratory.  Skin: No rash noted. She is not diaphoretic. No erythema. No pallor.  Psychiatric: She has a normal mood and affect. Her behavior is normal. Judgment and thought content normal.  Labs reviewed: No visits with results within 3 Month(s) from this visit. Latest known visit with results is:  Nursing Home on 11/04/2012  Component Date Value Ref Range Status  . Hemoglobin 11/03/2012 12.0  12.0 - 16.0 g/dL Final  . HCT 11/03/2012 36  36 - 46 % Final  . Platelets 11/03/2012 313  150 - 399 K/L Final  . WBC 11/03/2012 8.0   Final   . Glucose 11/03/2012 110   Final  . BUN 11/03/2012 15  4 - 21 mg/dL Final  . Creatinine 11/03/2012 0.7  0.5 - 1.1 mg/dL Final  . Potassium 11/03/2012 4.2  3.4 - 5.3 mmol/L Final  . Sodium 11/03/2012 135* 137 - 147 mmol/L Final  . TSH 06/09/2010 2.99  .41 - 5.90 uIU/mL Final      Assessment/Plan  1. Progressive supranuclear palsy unchanged  2. Dysphagia, oropharyngeal phase unchanged  3. Gait disorder unchanged  4. Blepharitis - erythromycin ophthalmic ointment; Apply to eyelid and sutures 4 times daily for 7 days.  Dispense: 3.5 g; Refill: 0

## 2013-09-03 ENCOUNTER — Ambulatory Visit (INDEPENDENT_AMBULATORY_CARE_PROVIDER_SITE_OTHER): Payer: Medicare Other | Admitting: Neurology

## 2013-09-03 ENCOUNTER — Encounter: Payer: Self-pay | Admitting: Neurology

## 2013-09-03 VITALS — BP 121/66 | HR 73

## 2013-09-03 DIAGNOSIS — G238 Other specified degenerative diseases of basal ganglia: Secondary | ICD-10-CM

## 2013-09-03 DIAGNOSIS — R269 Unspecified abnormalities of gait and mobility: Secondary | ICD-10-CM

## 2013-09-03 DIAGNOSIS — G231 Progressive supranuclear ophthalmoplegia [Steele-Richardson-Olszewski]: Secondary | ICD-10-CM

## 2013-09-03 NOTE — Patient Instructions (Signed)
Progressive Supranuclear Palsy Progressive supranuclear palsy (PSP) is a rare brain disorder. It causes serious and permanent problems with control of gait and balance. The symptoms of PSP are caused by a gradual deterioration of brain cells in a few tiny but important places at the base of the brain. This region is called the brainstem. SYMPTOMS   The most obvious sign of the disease is an inability to aim the eyes properly. This happens because of lesions in the area of the brain that coordinates eye movements. Some patients describe this effect as a blurring.  PSP patients often show changes mood and behavior. This includes:  Depression.  Apathy.  Progressive mild dementia.  The pattern of signs and symptoms can vary greatly from person to person. DIAGNOSIS  PSP is often misdiagnosed because some of its symptoms are very much like those of:  Parkinson's disease.  Alzheimer's disease.  Rare neurodegenerative disorders, such as Creutzfeldt-Jakob disease. The key to establishing the diagnosis of PSP is the identification of early gait instability and difficulty moving the eyes, the hallmark of the disease. Also important is ruling out other similar disorders, some of which are treatable. Although PSP gets progressively worse, no one dies from PSP itself. TREATMENT  There is currently no effective treatment for PSP. Scientists are searching for better ways to manage the disease.  In some patients, the slowness, stiffness, and balance problems of PSP may respond to antiparkinsonian agents such as levodopa, or levodopa combined with anticholinergic agents. But the effect is usually temporary.  The speech, vision, and swallowing difficulties usually do not respond to any drug treatment. Another group of drugs that has been of some modest success in PSP are antidepressant medications. The most commonly used of these drugs are Elavil, and Tofranil. The anti-PSP benefit of these drugs seems  not to be related to their ability to relieve depression.  Non-drug treatment for PSP can take many forms. Patients often use weighted walking aids because of their tendency to fall backward.  Bifocals or special glasses called prisms are sometimes prescribed for PSP patients. They can correct the difficulty of looking down.  Formal physical therapy is of no proven benefit in PSP. But certain exercises can keep the joints limber.  A surgical procedure may be needed when there are swallowing disturbances. It is called a gastrostomy. This surgery involves the placement of a tube through the skin of the abdomen into the stomach (intestine) for feeding purposes. Document Released: 03/30/2002 Document Revised: 07/02/2011 Document Reviewed: 04/09/2005 [facility] Patient Information 2014 [place].

## 2013-09-03 NOTE — Progress Notes (Signed)
Reason for visit: Progressive supranuclear palsy  Monica Waller is an 80 y.o. female  History of present illness:  Monica Waller is an 80 year old left-handed white female with a history of progressive supranuclear palsy. The patient indicates that she continues to fall, the last fall was 2 days prior to this visit. She tries to go to the bathroom on her own in a wheelchair, and then transfer to the toilet and back, and she will typically fall at this time. If she reaches over to get something on her nightstand, she may fall out of bed. She has difficulty taking pills, but she is able to eat food and fluid relatively well. The patient may call when she is lying down. She has had some problems with tearing of her eyes since last seen. She returns for an evaluation.  Past Medical History  Diagnosis Date  . Cerebral degeneration   . Ataxia   . Gait disorder   . Progressive supranuclear palsy   . Subdural hematoma     Hx. of a left frontal  . Cancer     Breast  . Malignant neoplasm of breast (female), unspecified site   . 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 Other generalized ischemic cerebrovascular disease 10/14/09    Noted on CT and MR brain 10/14/09  . Dysphagia, oropharyngeal phase     Past Surgical History  Procedure Laterality Date  . Mastectomy Right 1998    Dr. Rebekah Chesterfield  . Tonsillectomy    . Cataract extraction, bilateral      Family History  Problem Relation Age of Onset  . Other Mother     "Old Age"  . Cancer Father   . Cancer Brother     Colon    Social history:  reports that she quit smoking about 48 years ago. Her smoking use included Cigarettes. She smoked 0.00 packs per day. She has never used smokeless tobacco. She reports that she does not drink alcohol or use illicit drugs.   No Known Allergies  Medications:  Current Outpatient Prescriptions on File Prior to Visit  Medication Sig Dispense Refill  . Calcium  Carb-Cholecalciferol (CALTRATE 600+D) 600-800 MG-UNIT TABS Take 1 tablet by mouth.      . multivitamin-iron-minerals-folic acid (CENTRUM) chewable tablet Chew 1 tablet by mouth daily.       No current facility-administered medications on file prior to visit.    ROS:  Out of a complete 14 system review of symptoms, the patient complains only of the following symptoms, and all other reviewed systems are negative.  Runny nose Cough Tearing of the eyes  Blood pressure 121/66, pulse 73, weight 0 lb (0 kg).  Physical Exam  General: The patient is alert and cooperative at the time of the examination.  Skin: No significant peripheral edema is noted.   Neurologic Exam  Mental status: The patient is oriented x 3.  Cranial nerves: Facial symmetry is present. The patient has a wide-eyed stare. Speech is slightly hypophonic, dysarthric. Extraocular movements are notable for slow incomplete horizontal gaze bilaterally, complete absence of vertical gaze. Visual fields are full.  Motor: The patient has good strength in all 4 extremities.  Sensory examination: Soft touch sensation is symmetric on the face, arms, and legs.  Coordination: The patient has good finger-nose-finger and heel-to-shin bilaterally.  Gait and station: The patient requires assistance with standing. Once up, the patient has a tendency to  fall to the side or backwards. She requires assistance with standing. Romberg is positive. Tandem gait was not attempted.  Reflexes: Deep tendon reflexes are symmetric.   Assessment/Plan:  1. Progressive supranuclear palsy  The patient is not able to ambulate without assistance. She is not using good judgment when trying to transfer out of her wheelchair to the bed or vice versa. She needs to ask for assistance with this matter. There is otherwise no treatment for her gait disorder. She will followup in 6 months.  Jill Alexanders MD 09/03/2013 7:33 PM  [facility] [address] [place], [place] [address]  Phone [phone] Fax [phone]

## 2013-09-04 ENCOUNTER — Encounter: Payer: Self-pay | Admitting: *Deleted

## 2013-09-25 ENCOUNTER — Emergency Department (HOSPITAL_COMMUNITY)
Admission: EM | Admit: 2013-09-25 | Discharge: 2013-09-25 | Disposition: A | Discharge status: Home / Self Care | Payer: Medicare Other | Attending: Emergency Medicine | Admitting: Emergency Medicine

## 2013-09-25 ENCOUNTER — Emergency Department (HOSPITAL_COMMUNITY): Payer: Medicare Other

## 2013-09-25 ENCOUNTER — Encounter (HOSPITAL_COMMUNITY): Payer: Self-pay | Admitting: Emergency Medicine

## 2013-09-25 DIAGNOSIS — Y929 Unspecified place or not applicable: Secondary | ICD-10-CM | POA: Insufficient documentation

## 2013-09-25 DIAGNOSIS — Z79899 Other long term (current) drug therapy: Secondary | ICD-10-CM | POA: Insufficient documentation

## 2013-09-25 DIAGNOSIS — S0990XA Unspecified injury of head, initial encounter: Secondary | ICD-10-CM

## 2013-09-25 DIAGNOSIS — S81809A Unspecified open wound, unspecified lower leg, initial encounter: Secondary | ICD-10-CM

## 2013-09-25 DIAGNOSIS — S0180XA Unspecified open wound of other part of head, initial encounter: Secondary | ICD-10-CM | POA: Insufficient documentation

## 2013-09-25 DIAGNOSIS — W1809XA Striking against other object with subsequent fall, initial encounter: Secondary | ICD-10-CM | POA: Insufficient documentation

## 2013-09-25 DIAGNOSIS — Z87891 Personal history of nicotine dependence: Secondary | ICD-10-CM | POA: Insufficient documentation

## 2013-09-25 DIAGNOSIS — S91009A Unspecified open wound, unspecified ankle, initial encounter: Secondary | ICD-10-CM

## 2013-09-25 DIAGNOSIS — Y9389 Activity, other specified: Secondary | ICD-10-CM | POA: Insufficient documentation

## 2013-09-25 DIAGNOSIS — Z853 Personal history of malignant neoplasm of breast: Secondary | ICD-10-CM | POA: Insufficient documentation

## 2013-09-25 DIAGNOSIS — Z8673 Personal history of transient ischemic attack (TIA), and cerebral infarction without residual deficits: Secondary | ICD-10-CM | POA: Insufficient documentation

## 2013-09-25 DIAGNOSIS — IMO0002 Reserved for concepts with insufficient information to code with codable children: Secondary | ICD-10-CM

## 2013-09-25 DIAGNOSIS — S81009A Unspecified open wound, unspecified knee, initial encounter: Secondary | ICD-10-CM | POA: Insufficient documentation

## 2013-09-25 DIAGNOSIS — S51009A Unspecified open wound of unspecified elbow, initial encounter: Secondary | ICD-10-CM | POA: Insufficient documentation

## 2013-09-25 DIAGNOSIS — Z8669 Personal history of other diseases of the nervous system and sense organs: Secondary | ICD-10-CM | POA: Insufficient documentation

## 2013-09-25 NOTE — ED Notes (Signed)
Pt states that she got up to use the bathroom this am and fell, hitting her L eye on the shower. Pt has skin tear to L arm. Pt denies pain and states that she did not have LOC. PT is A&O and in NAD

## 2013-09-25 NOTE — ED Notes (Signed)
Pt POA, DNR and other legal papers left in chart. Security unable to deliver back to [facility]. Called [facility] courier, Nicole Kindred who is picking up papers from front desk in ED to return to facility.

## 2013-09-25 NOTE — ED Notes (Signed)
Family bedside, updated on POC

## 2013-09-25 NOTE — ED Notes (Addendum)
Pt from [place] via EMS after falling. Per EMS- staff states that pt is ambulatory, but falls a lot. Pt was found face down in her room this am by staff. Staff states that pt did not have LOC. Pt is A&O and in NAD. Pt does not take blood thinners. Pt has L eyebrown lac, bleeding controlled. Pt also has skin tear to L forearm and abrasion to R knee. Pt in NAD and denies pain.

## 2013-09-25 NOTE — ED Notes (Signed)
Dr.Ward completed suture of pts L eyebrow

## 2013-09-25 NOTE — ED Notes (Signed)
Dr. Ward at bedside.

## 2013-09-25 NOTE — ED Notes (Signed)
Pt POA, DNR and other paperwork found in chart. Security stated that they are unable to take paperwork back to facility. Called [facility] courier service and Nicole Kindred will pick papers up and return them back to [facility] placed in envelope with address to be delivered back.

## 2013-09-25 NOTE — ED Provider Notes (Signed)
TIME SEEN: 10:10 AM   CHIEF COMPLAINT: Fall, head injury  HPI: Patient is a 80 year old female with history of progressive supranuclear palsy with gait disorder and frequent falls, prior left subdural hematoma, breast cancer who presents to the emergency department with a fall today. She states that she was getting up to the bathroom when she fell. She states she fell because of "my palsy". She did strike her head. There is no loss of consciousness. She is not on anticoagulation. She has a laceration to the left eyebrow, skin tear to the right elbow and skin tear to the right posterior knee. She denies any pain currently. She denies any chest pain, shortness of breath, palpitations or dizziness that led her fall. No recent fevers, cough, vomiting or diarrhea, dysuria or hematuria. No headache, numbness or focal weakness. Last tetanus vaccination was in 2014.   ROS: See HPI Constitutional: no fever  Eyes: no drainage  ENT: no runny nose   Cardiovascular:  no chest pain  Resp: no SOB  GI: no vomiting GU: no dysuria Integumentary: no rash  Allergy: no hives  Musculoskeletal: no leg swelling  Neurological: no slurred speech ROS otherwise negative  PAST MEDICAL HISTORY/PAST SURGICAL HISTORY:  Past Medical History  Diagnosis Date  . Cerebral degeneration   . Ataxia   . Gait disorder   . Progressive supranuclear palsy   . Subdural hematoma     Hx. of a left frontal  . Cancer     Breast  . Malignant neoplasm of breast (female), unspecified site   . 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 Other generalized ischemic cerebrovascular disease 10/14/09    Noted on CT and MR brain 10/14/09  . Dysphagia, oropharyngeal phase     MEDICATIONS:  Prior to Admission medications   Medication Sig Start Date End Date Taking? Authorizing Provider  Calcium Carb-Cholecalciferol (CALTRATE 600+D) 600-800 MG-UNIT TABS Take 1 tablet by mouth.   Yes Historical  Provider, MD  multivitamin-iron-minerals-folic acid (CENTRUM) chewable tablet Chew 1 tablet by mouth daily.   Yes Historical Provider, MD  polyvinyl alcohol (ARTIFICIAL TEARS) 1.4 % ophthalmic solution 1 drop as needed for dry eyes. *wait 2-5 minutes between eye drops   Yes Historical Provider, MD    ALLERGIES:  No Known Allergies  SOCIAL HISTORY:  History  Substance Use Topics  . Smoking status: Former Smoker    Types: Cigarettes    Quit date: 05/04/1965  . Smokeless tobacco: Never Used  . Alcohol Use: No     Comment: Quit drinking    FAMILY HISTORY: Family History  Problem Relation Age of Onset  . Other Mother     "Old Age"  . Cancer Father   . Cancer Brother     Colon    EXAM: BP 138/67  Pulse 78  Temp(Src) 98.3 F (36.8 C) (Oral)  Resp 18  SpO2 96% CONSTITUTIONAL: Alert and oriented and responds appropriately to questions. Well-appearing; well-nourished; GCS 15 HEAD: Normocephalic; patient has a 3 cm laceration to her left lateral eyebrow and associated periorbital ecchymosis EYES: Conjunctivae clear, PERRL, EOMI ENT: normal nose; no rhinorrhea; moist mucous membranes; pharynx without lesions noted; no dental injury; no septal hematoma NECK: Supple, no meningismus, no LAD; no midline spinal tenderness, step-off or deformity CARD: RRR; S1 and S2 appreciated; no murmurs, no clicks, no rubs, no gallops RESP: Normal chest excursion without splinting or tachypnea; breath sounds clear and equal bilaterally;  no wheezes, no rhonchi, no rales; chest wall stable, nontender to palpation ABD/GI: Normal bowel sounds; non-distended; soft, non-tender, no rebound, no guarding PELVIS:  stable, nontender to palpation BACK:  The back appears normal and is non-tender to palpation, there is no CVA tenderness; no midline spinal tenderness, step-off or deformity EXT: Normal ROM in all joints; non-tender to palpation; no edema; normal capillary refill; no cyanosis, no tenderness to  palpation of her extremities, no joint effusion    SKIN: Normal color for age and race; warm, small skin tear to the right elbow, larger skin tear to the right posterior knee NEURO: Moves all extremities equally, sensation to light-touch intact diffusely, cranial nerves II through XII intact PSYCH: The patient's mood and manner are appropriate. Grooming and personal hygiene are appropriate.  MEDICAL DECISION MAKING: Patient here with fall with head injury. She does have a history of supranuclear palsy and frequent falls secondary to chronic gait disturbance. No new symptoms that led to her fall. She is hemodynamically stable, neurologically intact. Patient denies wanting any pain medication at this time. We'll obtain CT imaging of her head, cervical spine and face. Will repair her laceration. Anticipate if workup is negative, patient can be discharged back to her nursing facility at friend's home. Friend at bedside agrees with this plan.  ED PROGRESS: Imaging is all unremarkable. I have repaired patient's laceration. We'll discharge with her friend back to the nursing facility. Discussed head injury return precautions as supportive care instructions. Patient verbalizes understanding and is comfortable plan.   LACERATION REPAIR Performed by: Delice Bison Everet Flagg Authorized by: Delice Bison Sirius Woodford Consent: Verbal consent obtained. Risks and benefits: risks, benefits and alternatives were discussed Consent given by: patient Patient identity confirmed: provided demographic data Prepped and Draped in normal sterile fashion Wound explored  Laceration Location: Left eyebrow  Laceration Length: 3 cm  No Foreign Bodies seen or palpated  Anesthesia: local infiltration  Local anesthetic: lidocaine 1 % with epinephrine  Anesthetic total: 5 ml  Irrigation method: syringe Amount of cleaning: standard  Skin closure: Simple interrupted, 4.0 Prolene   Number of sutures: 6   Technique: Area was  anesthetized using 1% lidocaine with epinephrine, cleaned with saline and Clorpaprep, 6 simple interrupted sutures were placed with good approximation, bacitracin applied after   Patient tolerance: Patient tolerated the procedure well with no immediate complications.   [place], DO 09/25/13 1143

## 2013-09-25 NOTE — Discharge Instructions (Signed)
Please clean your wounds daily with warm soap and water. You may apply Neosporin ointment and dress the wounds. The sutures in her left eyebrow will need to be removed in 5-7 days. This can be done at your nursing facility or by your primary doctor. You may take Tylenol 650 mg every 6 hours as needed for pain.   Head Injury, Adult You have received a head injury. It does not appear serious at this time. Headaches and vomiting are common following head injury. It should be easy to awaken from sleeping. Sometimes it is necessary for you to stay in the emergency department for a while for observation. Sometimes admission to the hospital may be needed. After injuries such as yours, most problems occur within the first 24 hours, but side effects may occur up to 7 10 days after the injury. It is important for you to carefully monitor your condition and contact your health care provider or seek immediate medical care if there is a change in your condition. WHAT ARE THE TYPES OF HEAD INJURIES? Head injuries can be as minor as a bump. Some head injuries can be more severe. More severe head injuries include:  A jarring injury to the brain (concussion).  A bruise of the brain (contusion). This mean there is bleeding in the brain that can cause swelling.  A cracked skull (skull fracture).  Bleeding in the brain that collects, clots, and forms a bump (hematoma). WHAT CAUSES A HEAD INJURY? A serious head injury is most likely to happen to someone who is in a car wreck and is not wearing a seat belt. Other causes of major head injuries include bicycle or motorcycle accidents, sports injuries, and falls. HOW ARE HEAD INJURIES DIAGNOSED? A complete history of the event leading to the injury and your current symptoms will be helpful in diagnosing head injuries. Many times, pictures of the brain, such as CT or MRI are needed to see the extent of the injury. Often, an overnight hospital stay is necessary for  observation.  WHEN SHOULD I SEEK IMMEDIATE MEDICAL CARE?  You should get help right away if:  You have confusion or drowsiness.  You feel sick to your stomach (nauseous) or have continued, forceful vomiting.  You have dizziness or unsteadiness that is getting worse.  You have severe, continued headaches not relieved by medicine. Only take over-the-counter or prescription medicines for pain, fever, or discomfort as directed by your health care provider.  You do not have normal function of the arms or legs or are unable to walk.  You notice changes in the black spots in the center of the colored part of your eye (pupil).  You have a clear or bloody fluid coming from your nose or ears.  You have a loss of vision. During the next 24 hours after the injury, you must stay with someone who can watch you for the warning signs. This person should contact local emergency services (911 in the [place] if you have seizures, you become unconscious, or you are unable to wake up. HOW CAN I PREVENT A HEAD INJURY IN THE FUTURE? The most important factor for preventing major head injuries is avoiding motor vehicle accidents. To minimize the potential for damage to your head, it is crucial to wear seat belts while riding in motor vehicles. Wearing helmets while bike riding and playing collision sports (like football) is also helpful. Also, avoiding dangerous activities around the house will further help reduce your risk of head injury.  WHEN CAN I RETURN TO NORMAL ACTIVITIES AND ATHLETICS? You should be reevaluated by your health care provider before returning to these activities. If you have any of the following symptoms, you should not return to activities or contact sports until 1 week after the symptoms have stopped:  Persistent headache.  Dizziness or vertigo.  Poor attention and concentration.  Confusion.  Memory problems.  Nausea or vomiting.  Fatigue or tire  easily.  Irritability.  Intolerant of bright lights or loud noises.  Anxiety or depression.  Disturbed sleep. MAKE SURE YOU:   Understand these instructions.  Will watch your condition.  Will get help right away if you are not doing well or get worse. Document Released: 04/09/2005 Document Revised: 01/28/2013 Document Reviewed: 12/15/2012 [facility] Patient Information 2014 [place].  Laceration Care, Adult A laceration is a cut or lesion that goes through all layers of the skin and into the tissue just beneath the skin. TREATMENT  Some lacerations may not require closure. Some lacerations may not be able to be closed due to an increased risk of infection. It is important to see your caregiver as soon as possible after an injury to minimize the risk of infection and maximize the opportunity for successful closure. If closure is appropriate, pain medicines may be given, if needed. The wound will be cleaned to help prevent infection. Your caregiver will use stitches (sutures), staples, wound glue (adhesive), or skin adhesive strips to repair the laceration. These tools bring the skin edges together to allow for faster healing and a better cosmetic outcome. However, all wounds will heal with a scar. Once the wound has healed, scarring can be minimized by covering the wound with sunscreen during the day for 1 full year. HOME CARE INSTRUCTIONS  For sutures or staples:  Keep the wound clean and dry.  If you were given a bandage (dressing), you should change it at least once a day. Also, change the dressing if it becomes wet or dirty, or as directed by your caregiver.  Wash the wound with soap and water 2 times a day. Rinse the wound off with water to remove all soap. Pat the wound dry with a clean towel.  After cleaning, apply a thin layer of the antibiotic ointment as recommended by your caregiver. This will help prevent infection and keep the dressing from sticking.  You may  shower as usual after the first 24 hours. Do not soak the wound in water until the sutures are removed.  Only take over-the-counter or prescription medicines for pain, discomfort, or fever as directed by your caregiver.  Get your sutures or staples removed as directed by your caregiver. For skin adhesive strips:  Keep the wound clean and dry.  Do not get the skin adhesive strips wet. You may bathe carefully, using caution to keep the wound dry.  If the wound gets wet, pat it dry with a clean towel.  Skin adhesive strips will fall off on their own. You may trim the strips as the wound heals. Do not remove skin adhesive strips that are still stuck to the wound. They will fall off in time. For wound adhesive:  You may briefly wet your wound in the shower or bath. Do not soak or scrub the wound. Do not swim. Avoid periods of heavy perspiration until the skin adhesive has fallen off on its own. After showering or bathing, gently pat the wound dry with a clean towel.  Do not apply liquid medicine, cream medicine, or  ointment medicine to your wound while the skin adhesive is in place. This may loosen the film before your wound is healed.  If a dressing is placed over the wound, be careful not to apply tape directly over the skin adhesive. This may cause the adhesive to be pulled off before the wound is healed.  Avoid prolonged exposure to sunlight or tanning lamps while the skin adhesive is in place. Exposure to ultraviolet light in the first year will darken the scar.  The skin adhesive will usually remain in place for 5 to 10 days, then naturally fall off the skin. Do not pick at the adhesive film. You may need a tetanus shot if:  You cannot remember when you had your last tetanus shot.  You have never had a tetanus shot. If you get a tetanus shot, your arm may swell, get red, and feel warm to the touch. This is common and not a problem. If you need a tetanus shot and you choose not to have  one, there is a rare chance of getting tetanus. Sickness from tetanus can be serious. SEEK MEDICAL CARE IF:   You have redness, swelling, or increasing pain in the wound.  You see a red line that goes away from the wound.  You have yellowish-white fluid (pus) coming from the wound.  You have a fever.  You notice a bad smell coming from the wound or dressing.  Your wound breaks open before or after sutures have been removed.  You notice something coming out of the wound such as wood or glass.  Your wound is on your hand or foot and you cannot move a finger or toe. SEEK IMMEDIATE MEDICAL CARE IF:   Your pain is not controlled with prescribed medicine.  You have severe swelling around the wound causing pain and numbness or a change in color in your arm, hand, leg, or foot.  Your wound splits open and starts bleeding.  You have worsening numbness, weakness, or loss of function of any joint around or beyond the wound.  You develop painful lumps near the wound or on the skin anywhere on your body. MAKE SURE YOU:   Understand these instructions.  Will watch your condition.  Will get help right away if you are not doing well or get worse. Document Released: 04/09/2005 Document Revised: 07/02/2011 Document Reviewed: 10/03/2010 [facility] Patient Information 2014 [place], [place].

## 2013-09-25 NOTE — ED Notes (Signed)
Pt changed from nightgown into paper scrubs for transport back to facility by her daughter.

## 2013-09-25 NOTE — ED Notes (Signed)
Bed: WA11 Expected date:  Expected time:  Means of arrival:  Comments: EMS-fall 

## 2013-09-29 ENCOUNTER — Non-Acute Institutional Stay: Payer: Medicare Other | Admitting: Nurse Practitioner

## 2013-09-29 ENCOUNTER — Encounter: Payer: Self-pay | Admitting: Nurse Practitioner

## 2013-09-29 DIAGNOSIS — G238 Other specified degenerative diseases of basal ganglia: Secondary | ICD-10-CM

## 2013-09-29 DIAGNOSIS — W19XXXA Unspecified fall, initial encounter: Secondary | ICD-10-CM

## 2013-09-29 DIAGNOSIS — S0180XA Unspecified open wound of other part of head, initial encounter: Secondary | ICD-10-CM

## 2013-09-29 DIAGNOSIS — S01112A Laceration without foreign body of left eyelid and periocular area, initial encounter: Secondary | ICD-10-CM | POA: Insufficient documentation

## 2013-09-29 DIAGNOSIS — G231 Progressive supranuclear ophthalmoplegia [Steele-Richardson-Olszewski]: Secondary | ICD-10-CM

## 2013-09-29 NOTE — Assessment & Plan Note (Signed)
Frequent falling.

## 2013-09-29 NOTE — Progress Notes (Signed)
Patient ID: Monica Waller, female   DOB: [birth year], 79 y.o.   MRN: [id number]    Code Status: DNR  No Known Allergies  Chief Complaint  Patient presents with  . Medical Management of Chronic Issues  . Acute Visit  . Hospitalization Follow-up    s/p fall, left forehead and left poplitearl laceration.     HPI: Patient is a 80 y.o. female seen in the AL at [facility] today for evaluation of s/p ED evaluation for left forehead/left popliteal laceration,  s/p fall, and her other chronic medical conditions.   09/25/13 ED Patient is a 80 year old female with history of progressive supranuclear palsy with gait disorder and frequent falls, prior left subdural hematoma, breast cancer who presents to the emergency department with a fall 09/25/13. She states that she was getting up to the bathroom when she fell. She states she fell because of "my palsy". She did strike her head. There is no loss of consciousness. She is not on anticoagulation. She has a laceration to the left eyebrow, skin tear to the left elbow and skin tear to the left posterior knee. She denies any pain currently. She denies any chest pain, shortness of breath, palpitations or dizziness that led her fall. No recent fevers, cough, vomiting or diarrhea, dysuria or hematuria. No headache, numbness or focal weakness.   evaluation:  Problem List Items Addressed This Visit   Progressive supranuclear palsy - Primary     Frequent falling.     Laceration of left eyebrow without complication     Lateral left eyebrow-suture closures intact w/o s/s of infection. Sutures will be removed 10/02/13    Fall     Frequently falling. Intensive supervision needed.        Review of Systems:  Review of Systems  Constitutional: Negative for fever, chills, weight loss, malaise/fatigue and diaphoresis.  HENT: Positive for hearing loss. Negative for congestion, ear discharge, ear pain, nosebleeds, sore throat and tinnitus.   Eyes: Positive for  double vision (?). Negative for blurred vision, photophobia, pain, discharge and redness.       ? supranuclear ophthalmoplegia  Respiratory: Negative for cough, hemoptysis, sputum production, shortness of breath, wheezing and stridor.   Cardiovascular: Negative for chest pain, palpitations, orthopnea, claudication, leg swelling and PND.  Gastrointestinal: Negative for heartburn, nausea, vomiting, abdominal pain, diarrhea, constipation, blood in stool and melena.  Genitourinary: Positive for frequency. Negative for dysuria, urgency, hematuria and flank pain.  Musculoskeletal: Positive for falls. Negative for back pain, joint pain, myalgias and neck pain.  Skin: Negative for itching and rash.       The lateral left eyebrow laceration with suture closure. Left popliteal area laceration-superficial skin tear.   Neurological: Negative for dizziness, tingling, tremors, sensory change, speech change, focal weakness, seizures, loss of consciousness, weakness and headaches.  Endo/Heme/Allergies: Negative for environmental allergies and polydipsia. Does not bruise/bleed easily.  Psychiatric/Behavioral: Positive for memory loss. Negative for depression, suicidal ideas, hallucinations and substance abuse. The patient is not nervous/anxious and does not have insomnia.      Past Medical History  Diagnosis Date  . Cerebral degeneration   . Ataxia   . Gait disorder   . Progressive supranuclear palsy   . Subdural hematoma     Hx. of a left frontal  . 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 Other generalized ischemic cerebrovascular disease 10/14/09    Noted on CT and  MR brain 10/14/09  . Dysphagia, oropharyngeal phase   . Cancer     Breast  . Malignant neoplasm of breast (female), unspecified site    Past Surgical History  Procedure Laterality Date  . Mastectomy Right 1998    Dr. Rebekah Chesterfield  . Tonsillectomy    . Cataract extraction, bilateral     Social  History:   reports that she quit smoking about 48 years ago. Her smoking use included Cigarettes. She smoked 0.00 packs per day. She has never used smokeless tobacco. She reports that she does not drink alcohol or use illicit drugs.  Family History  Problem Relation Age of Onset  . Other Mother     "Old Age"  . Cancer Father   . Cancer Brother     Colon    Medications: Patient's Medications  New Prescriptions   No medications on file  Previous Medications   CALCIUM CARB-CHOLECALCIFEROL (CALTRATE 600+D) 600-800 MG-UNIT TABS    Take 1 tablet by mouth.   MULTIVITAMIN-IRON-MINERALS-FOLIC ACID (CENTRUM) CHEWABLE TABLET    Chew 1 tablet by mouth daily.   POLYVINYL ALCOHOL (ARTIFICIAL TEARS) 1.4 % OPHTHALMIC SOLUTION    1 drop as needed for dry eyes. *wait 2-5 minutes between eye drops  Modified Medications   No medications on file  Discontinued Medications   No medications on file     Physical Exam: Physical Exam  Constitutional: She is oriented to person, place, and time. She appears well-developed and well-nourished. No distress.  HENT:  Head: Normocephalic.  Right Ear: External ear normal.  Left Ear: External ear normal.  Nose: Nose normal.  Mouth/Throat: Oropharynx is clear and moist. No oropharyngeal exudate.  Eyes: Conjunctivae are normal. Right eye exhibits no discharge. Left eye exhibits no discharge. No scleral icterus.  Difficulty looking down followed by an upgaze ?palsy  Neck: Normal range of motion. Neck supple. No JVD present. No tracheal deviation present. No thyromegaly present.  Cardiovascular: Normal rate, regular rhythm, normal heart sounds and intact distal pulses.   No murmur heard. Pulmonary/Chest: Effort normal and breath sounds normal. No stridor. No respiratory distress. She has no wheezes. She has no rales. She exhibits no tenderness.  Abdominal: Soft. Bowel sounds are normal. She exhibits no distension. There is no tenderness. There is no rebound and no  guarding.  Musculoskeletal: Normal range of motion. She exhibits no edema and no tenderness.  Lymphadenopathy:    She has no cervical adenopathy.  Neurological: She is alert and oriented to person, place, and time. She displays normal reflexes. A cranial nerve deficit is present. She exhibits normal muscle tone. Coordination abnormal.  Skin: Skin is warm and dry. No rash noted. She is not diaphoretic. No erythema. No pallor.  The lateral left eyebrow laceration with suture closure. Left popliteal area laceration-superficial skin tear about a quarter size w/o s/s of infection.   Psychiatric: She has a normal mood and affect. Her speech is normal and behavior is normal. Judgment and thought content normal. Cognition and memory are impaired. She does not express impulsivity or inappropriate judgment. She exhibits abnormal recent memory. She exhibits normal remote memory.    Filed Vitals:   09/29/13 1556  BP: 126/60  Pulse: 54  Temp: 97.5 F (36.4 C)  TempSrc: Tympanic  Resp: 18    Labs reviewed: Basic Metabolic Panel:  Recent Labs  11/03/12  NA 135*  K 4.2  BUN 15  CREATININE 0.7   CBC:  Recent Labs  11/03/12  WBC 8.0  HGB 12.0  HCT 36  PLT 313   Past Procedures:  01/16/13 CT head Wo Contrast:   IMPRESSION:  Atrophy and chronic microvascular ischemia. No acute abnormality   09/25/13 CT head, cervical spine, maxillary bones:   IMPRESSION:  No acute intracranial abnormality. Chronic and involutional changes  appreciated.  Left periorbital hematoma. No evidence of acute fracture nor  dislocation. Findings which may represent sinus disease within the  ethmoid air cells.  Multilevel spondylosis within the cervical spine without acute  osseous abnormalities.  Assessment/Plan Progressive supranuclear palsy Frequent falling.   Fall Frequently falling. Intensive supervision needed.   Laceration of left eyebrow without complication Lateral left eyebrow-suture closures  intact w/o s/s of infection. Sutures will be removed 10/02/13    Family/ Staff Communication: observe the patient.   Goals of Care: AL  Labs/tests ordered: none

## 2013-09-29 NOTE — Assessment & Plan Note (Signed)
Frequently falling. Intensive supervision needed.

## 2013-09-29 NOTE — Assessment & Plan Note (Signed)
Lateral left eyebrow-suture closures intact w/o s/s of infection. Sutures will be removed 10/02/13

## 2013-10-06 ENCOUNTER — Encounter: Payer: Self-pay | Admitting: Nurse Practitioner

## 2013-10-06 ENCOUNTER — Non-Acute Institutional Stay: Payer: Medicare Other | Admitting: Nurse Practitioner

## 2013-10-06 DIAGNOSIS — G231 Progressive supranuclear ophthalmoplegia [Steele-Richardson-Olszewski]: Secondary | ICD-10-CM

## 2013-10-06 DIAGNOSIS — G238 Other specified degenerative diseases of basal ganglia: Secondary | ICD-10-CM

## 2013-10-06 DIAGNOSIS — N390 Urinary tract infection, site not specified: Secondary | ICD-10-CM

## 2013-10-06 DIAGNOSIS — S01112A Laceration without foreign body of left eyelid and periocular area, initial encounter: Secondary | ICD-10-CM

## 2013-10-06 DIAGNOSIS — S0180XA Unspecified open wound of other part of head, initial encounter: Secondary | ICD-10-CM

## 2013-10-06 NOTE — Assessment & Plan Note (Signed)
healed 

## 2013-10-06 NOTE — Progress Notes (Signed)
Patient ID: Monica Waller, female   DOB: [birth year], 80 y.o.   MRN: [id number]    Code Status: DNR  No Known Allergies  Chief Complaint  Patient presents with  . Medical Management of Chronic Issues  . Acute Visit    UTI    HPI: Patient is a 80 y.o. female seen in the AL at [facility] today for evaluation of UTI and her chronic medical conditions.    6/5/15ED evaluation for left forehead/left popliteal laceration-healed.    Problem List Items Addressed This Visit   UTI (lower urinary tract infection) - Primary     10/05/13 urine culture: K. Pneumoniae Septra DS bid x 7days.      Progressive supranuclear palsy     Frequent falling.      Laceration of left eyebrow without complication     healed       Review of Systems:  Review of Systems  Constitutional: Negative for fever, chills, weight loss, malaise/fatigue and diaphoresis.  HENT: Positive for hearing loss. Negative for congestion, ear discharge, ear pain, nosebleeds, sore throat and tinnitus.   Eyes: Positive for double vision (?). Negative for blurred vision, photophobia, pain, discharge and redness.       ? supranuclear ophthalmoplegia  Respiratory: Negative for cough, hemoptysis, sputum production, shortness of breath, wheezing and stridor.   Cardiovascular: Negative for chest pain, palpitations, orthopnea, claudication, leg swelling and PND.  Gastrointestinal: Negative for heartburn, nausea, vomiting, abdominal pain, diarrhea, constipation, blood in stool and melena.  Genitourinary: Positive for frequency. Negative for dysuria, urgency, hematuria and flank pain.  Musculoskeletal: Positive for falls. Negative for back pain, joint pain, myalgias and neck pain.  Skin: Negative for itching and rash.       The lateral left eyebrow laceration with suture closure. Left popliteal area laceration-superficial skin tear. Healed.   Neurological: Negative for dizziness, tingling, tremors, sensory change, speech change,  focal weakness, seizures, loss of consciousness, weakness and headaches.  Endo/Heme/Allergies: Negative for environmental allergies and polydipsia. Does not bruise/bleed easily.  Psychiatric/Behavioral: Positive for memory loss. Negative for depression, suicidal ideas, hallucinations and substance abuse. The patient is not nervous/anxious and does not have insomnia.      Past Medical History  Diagnosis Date  . Cerebral degeneration   . Ataxia   . Gait disorder   . Progressive supranuclear palsy   . Subdural hematoma     Hx. of a left frontal  . 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 Other generalized ischemic cerebrovascular disease 10/14/09    Noted on CT and MR brain 10/14/09  . Dysphagia, oropharyngeal phase   . Cancer     Breast  . Malignant neoplasm of breast (female), unspecified site    Past Surgical History  Procedure Laterality Date  . Mastectomy Right 1998    Dr. Rebekah Chesterfield  . Tonsillectomy    . Cataract extraction, bilateral     Social History:   reports that she quit smoking about 48 years ago. Her smoking use included Cigarettes. She smoked 0.00 packs per day. She has never used smokeless tobacco. She reports that she does not drink alcohol or use illicit drugs.  Family History  Problem Relation Age of Onset  . Other Mother     "Old Age"  . Cancer Father   . Cancer Brother     Colon    Medications: Patient's Medications  New Prescriptions   No  medications on file  Previous Medications   CALCIUM CARB-CHOLECALCIFEROL (CALTRATE 600+D) 600-800 MG-UNIT TABS    Take 1 tablet by mouth.   MULTIVITAMIN-IRON-MINERALS-FOLIC ACID (CENTRUM) CHEWABLE TABLET    Chew 1 tablet by mouth daily.   POLYVINYL ALCOHOL (ARTIFICIAL TEARS) 1.4 % OPHTHALMIC SOLUTION    1 drop as needed for dry eyes. *wait 2-5 minutes between eye drops  Modified Medications   No medications on file  Discontinued Medications   No medications on file      Physical Exam: Physical Exam  Constitutional: She is oriented to person, place, and time. She appears well-developed and well-nourished. No distress.  HENT:  Head: Normocephalic.  Right Ear: External ear normal.  Left Ear: External ear normal.  Nose: Nose normal.  Mouth/Throat: Oropharynx is clear and moist. No oropharyngeal exudate.  Eyes: Conjunctivae are normal. Right eye exhibits no discharge. Left eye exhibits no discharge. No scleral icterus.  Difficulty looking down followed by an upgaze ?palsy  Neck: Normal range of motion. Neck supple. No JVD present. No tracheal deviation present. No thyromegaly present.  Cardiovascular: Normal rate, regular rhythm, normal heart sounds and intact distal pulses.   No murmur heard. Pulmonary/Chest: Effort normal and breath sounds normal. No stridor. No respiratory distress. She has no wheezes. She has no rales. She exhibits no tenderness.  Abdominal: Soft. Bowel sounds are normal. She exhibits no distension. There is no tenderness. There is no rebound and no guarding.  Musculoskeletal: Normal range of motion. She exhibits no edema and no tenderness.  Lymphadenopathy:    She has no cervical adenopathy.  Neurological: She is alert and oriented to person, place, and time. She displays normal reflexes. A cranial nerve deficit is present. She exhibits normal muscle tone. Coordination abnormal.  Skin: Skin is warm and dry. No rash noted. She is not diaphoretic. No erythema. No pallor.  Psychiatric: She has a normal mood and affect. Her speech is normal and behavior is normal. Judgment and thought content normal. Cognition and memory are impaired. She does not express impulsivity or inappropriate judgment. She exhibits abnormal recent memory. She exhibits normal remote memory.    Filed Vitals:   10/06/13 1109  BP: 114/60  Pulse: 64  Temp: 98.1 F (36.7 C)  TempSrc: Tympanic  Resp: 20    Labs reviewed: Basic Metabolic Panel:  Recent  Labs  11/03/12  NA 135*  K 4.2  BUN 15  CREATININE 0.7   CBC:  Recent Labs  11/03/12  WBC 8.0  HGB 12.0  HCT 36  PLT 313   Past Procedures:  01/16/13 CT head Wo Contrast:   IMPRESSION:  Atrophy and chronic microvascular ischemia. No acute abnormality   09/25/13 CT head, cervical spine, maxillary bones:   IMPRESSION:  No acute intracranial abnormality. Chronic and involutional changes  appreciated.  Left periorbital hematoma. No evidence of acute fracture nor  dislocation. Findings which may represent sinus disease within the  ethmoid air cells.  Multilevel spondylosis within the cervical spine without acute  osseous abnormalities.  Assessment/Plan UTI (lower urinary tract infection) 10/05/13 urine culture: K. Pneumoniae Septra DS bid x 7days.    Progressive supranuclear palsy Frequent falling.    Laceration of left eyebrow without complication healed    Family/ Staff Communication: observe the patient.   Goals of Care: AL  Labs/tests ordered: none

## 2013-10-06 NOTE — Assessment & Plan Note (Signed)
Frequent falling.

## 2013-10-06 NOTE — Assessment & Plan Note (Signed)
10/05/13 urine culture: K. Pneumoniae Septra DS bid x 7days.

## 2013-12-15 ENCOUNTER — Encounter: Payer: Self-pay | Admitting: Internal Medicine

## 2013-12-15 ENCOUNTER — Non-Acute Institutional Stay: Payer: Medicare Other | Admitting: Internal Medicine

## 2013-12-15 VITALS — BP 134/69 | HR 80 | Wt 121.0 lb

## 2013-12-15 DIAGNOSIS — G231 Progressive supranuclear ophthalmoplegia [Steele-Richardson-Olszewski]: Secondary | ICD-10-CM

## 2013-12-15 DIAGNOSIS — H538 Other visual disturbances: Secondary | ICD-10-CM | POA: Insufficient documentation

## 2013-12-15 DIAGNOSIS — W19XXXD Unspecified fall, subsequent encounter: Secondary | ICD-10-CM

## 2013-12-15 DIAGNOSIS — G238 Other specified degenerative diseases of basal ganglia: Secondary | ICD-10-CM

## 2013-12-15 DIAGNOSIS — Z5189 Encounter for other specified aftercare: Secondary | ICD-10-CM

## 2013-12-15 DIAGNOSIS — W19XXXA Unspecified fall, initial encounter: Secondary | ICD-10-CM

## 2013-12-15 DIAGNOSIS — Z299 Encounter for prophylactic measures, unspecified: Secondary | ICD-10-CM

## 2013-12-15 DIAGNOSIS — R1312 Dysphagia, oropharyngeal phase: Secondary | ICD-10-CM

## 2013-12-15 NOTE — Progress Notes (Signed)
Patient ID: Monica Waller, female   DOB: Jul 05, 1923, 80 y.o.   MRN: [id number]    Location:  [facility] (12)    No Known Allergies  Chief Complaint  Patient presents with  . Medical Management of Chronic Issues    supranuclear palsy, dyshagia, gait disorder.  . Blurred Vision    trouble started today both eyes  . Fall    09/25/13 laceration over eye    HPI:  Blurred vision: both eyes. Dr. Ellie Lunch, ophth started new eye drop in June 2015, but she doe not feelike it is helping. Has follow up appt next week. Denies pain or burning. Has a lot of tearing.  Fall, subsequent encounter: no residual issues. Has had another non injurious fall i her bathroom in Aug 2015.  Progressive supranuclear palsy: unchanged  Dysphagia, oropharyngeal phase: denies any episodes of choking or coughing while eating recently  Preventive measure - Plan: DNR (Do Not Resuscitate)    Medications: Patient's Medications  New Prescriptions   No medications on file  Previous Medications   CALCIUM CARB-CHOLECALCIFEROL (CALTRATE 600+D) 600-800 MG-UNIT TABS    Take 1 tablet by mouth.   MULTIVITAMIN-IRON-MINERALS-FOLIC ACID (CENTRUM) CHEWABLE TABLET    Chew 1 tablet by mouth daily.   POLYVINYL ALCOHOL (ARTIFICIAL TEARS) 1.4 % OPHTHALMIC SOLUTION    1 drop as needed for dry eyes. *wait 2-5 minutes between eye drops , twice daily  Modified Medications   No medications on file  Discontinued Medications   No medications on file     Review of Systems  Constitutional: Positive for fatigue.  HENT: Positive for hearing loss. Negative for ear pain.   Eyes: Positive for visual disturbance (Corrective lenses).       Excessive tearing especially of the right eye. Some increase in erythema of the right eye.  Respiratory: Negative.   Cardiovascular: Negative for chest pain, palpitations and leg swelling.  Gastrointestinal: Negative.   Endocrine: Negative for cold intolerance, heat  intolerance, polydipsia and polyphagia.  Genitourinary: Negative.   Musculoskeletal: Positive for gait problem.       Unstable gait. History of multiple falls.  Skin:       History right mastectomy for breast cancer  Neurological:       Progressive supranuclear palsy. Ataxia. Unstable gait. Clumsy right leg and foot.  Hematological: Negative.   Psychiatric/Behavioral: Negative.     Filed Vitals:   12/15/13 0945  BP: 134/69  Pulse: 80  Weight: 121 lb (54.885 kg)   Body mass index is 19.99 kg/(m^2).  Physical Exam  Constitutional: She is oriented to person, place, and time. She appears well-developed and well-nourished. No distress.  HENT:  Head: Normocephalic and atraumatic.  Right Ear: External ear normal.  Left Ear: External ear normal.  Nose: Nose normal.  Eyes: EOM are normal. Pupils are equal, round, and reactive to light.  Increased erythema of the right eye..  Neck: No JVD present. No tracheal deviation present. No thyromegaly present.  Cardiovascular: Normal rate, regular rhythm, normal heart sounds and intact distal pulses.  Exam reveals no gallop and no friction rub.   No murmur heard. Pulmonary/Chest: No respiratory distress. She has no wheezes. She has no rales. She exhibits no tenderness.  Abdominal: She exhibits no distension and no mass. There is no tenderness.  Musculoskeletal: Normal range of motion. She exhibits no edema and no tenderness.  Gait very unstable and wobbly. Difficulty keeping balanced. Manifests a scissor gait.  Lymphadenopathy:    She has no cervical adenopathy.  Neurological: She is alert and oriented to person, place, and time. She has normal reflexes. No cranial nerve deficit. Coordination (Ataxic movements) abnormal.  Normal sensation to touch and vibratory.  Skin: No rash noted. She is not diaphoretic. No erythema. No pallor.  Psychiatric: She has a normal mood and affect. Her behavior is normal. Judgment and thought content normal.      Labs reviewed: No visits with results within 3 Month(s) from this visit. Latest known visit with results is:  Nursing Home on 11/04/2012  Component Date Value Ref Range Status  . Hemoglobin 11/03/2012 12.0  12.0 - 16.0 g/dL Final  . HCT 11/03/2012 36  36 - 46 % Final  . Platelets 11/03/2012 313  150 - 399 K/L Final  . WBC 11/03/2012 8.0   Final  . Glucose 11/03/2012 110   Final  . BUN 11/03/2012 15  4 - 21 mg/dL Final  . Creatinine 11/03/2012 0.7  0.5 - 1.1 mg/dL Final  . Potassium 11/03/2012 4.2  3.4 - 5.3 mmol/L Final  . Sodium 11/03/2012 135* 137 - 147 mmol/L Final  . TSH 06/09/2010 2.99  .41 - 5.90 uIU/mL Final     Assessment/Plan  1. Blurred vision -follow up with Dr. Ellie Lunch  2. Fall, subsequent encounter -at risk for more falls  3. Progressive supranuclear palsy unchanged  4. Dysphagia, oropharyngeal phase improved  5. Preventive measure - DNR (Do Not Resuscitate)

## 2013-12-22 ENCOUNTER — Telehealth: Payer: Self-pay | Admitting: Neurology

## 2013-12-22 NOTE — Telephone Encounter (Signed)
Dr. Ellie Lunch called, the patient was seen today for routine eye examination, has lost all vision in the left eye, the patient herself has no knowledge as to when this actually happened. She is not reporting any headaches, new numbness or weakness, speech changes, or confusion. 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 The patient will need a revisit here soon for a sedimentation rate, carotid Doppler studies, and a MRI of the brain.

## 2013-12-22 NOTE — Telephone Encounter (Signed)
Patient's Healthcare POA called and asked that she be contacted in regards to scheduling her appointment.  Her name is Monica Waller, and her number is [phone].

## 2013-12-23 ENCOUNTER — Ambulatory Visit (INDEPENDENT_AMBULATORY_CARE_PROVIDER_SITE_OTHER): Payer: Medicare Other | Admitting: Neurology

## 2013-12-23 ENCOUNTER — Encounter: Payer: Self-pay | Admitting: Neurology

## 2013-12-23 VITALS — BP 130/72 | HR 60 | Wt 119.0 lb

## 2013-12-23 DIAGNOSIS — G238 Other specified degenerative diseases of basal ganglia: Secondary | ICD-10-CM

## 2013-12-23 DIAGNOSIS — G231 Progressive supranuclear ophthalmoplegia [Steele-Richardson-Olszewski]: Secondary | ICD-10-CM

## 2013-12-23 DIAGNOSIS — H3410 Central retinal artery occlusion, unspecified eye: Secondary | ICD-10-CM

## 2013-12-23 DIAGNOSIS — H3412 Central retinal artery occlusion, left eye: Secondary | ICD-10-CM

## 2013-12-23 HISTORY — DX: Central retinal artery occlusion, unspecified eye: H34.10

## 2013-12-23 NOTE — Patient Instructions (Signed)
Central Retinal Artery Occlusion Central retinal artery occlusion is when the artery that supplies blood to the retina is blocked. The retina is the layer of cells at the back of the eye that let us see light and color. These cells send light images to the brain by way of the optic nerve. The optic nerve connects directly to the brain from the back of the eye. If the central retinal artery is blocked, cells in the retina needed for vision die. A central retinal artery occlusion is a stroke of the retina and usually results in blindness of the affected eye. About 14% of people have a small extra artery (cilioretinal artery) coming off the central retinal artery and supplies blood to a key part of the retina. These people are very lucky if they have a central retinal artery occlusion because they may retain some vision. CAUSES  There are many different things that could cause the blockage such as:  Hardening of the arteries (arteriosclerosis).  High blood pressure.  High pressure in the eye from long occurring (chronic) or sudden (acute) glaucoma.  Swelling of the optic nerve. This can happen from inflammation of the nerve itself or from high pressure inside the head around the brain.  A small piece of fat traveling in the blood stream (fat embolus).  A blood clot in the artery.  A condition called arteritis (a response to injury or infection by an artery).  Migraine headache. This condition is more likely to happen in someone with:  Diabetes.  High blood pressure.  An abnormal heart rhythm.  Certain types of arthritis.  Certain blood conditions.  Certain heart valve problems.  Narrowing of the main arteries in the neck that supply blood to the brain (the carotid arteries). SYMPTOMS   Sudden, painless, total loss of vision in one eye. There are no symptoms before it happens.  If only a branch of this artery is blocked, there may be partial loss of vision coming from the area of  the retina that is not receiving blood. DIAGNOSIS  An ophthalmologist can usually diagnose this condition by examining the inside of the eye. Some of the usual findings of swelling only appear for a short time. By the time they go away, the retinal cells are damaged for good and cannot be restored.  TREATMENT  In very rare cases, sudden lowering of pressure in the eye by using medicines and actually draining some of the eye fluids can restore blood flow. This is rarely successful. Treatments that may be used are:  Medicines that lower the pressure inside the eye.  Medicines that can improve blood supply to the retina.  Oxygen with a small amount of carbon dioxide. It is important to get as much oxygen as possible to the retina. The carbon dioxide can help open blood vessels and let more oxygen and blood get to the retina. SEEK IMMEDIATE MEDICAL CARE IF:  You suddenly have a total loss of vision in one eye. It may not be possible to restore the vision that has been lost. However, it is important to know what the cause of the central retinal artery occlusion was so that any underlying disease can be treated right away. Document Released: 07/22/2000 Document Revised: 08/24/2013 Document Reviewed: 05/26/2007 Exit[employer] Patient Information 2015 [employer]. This information is not intended to replace advice given to you by your health care provider. Make sure you discuss any questions you have with your health care provider.  

## 2013-12-23 NOTE — Progress Notes (Signed)
Reason for visit: Left central retinal artery occlusion  Monica Waller is an 80 y.o. female  History of present illness:  Monica Waller she is an 80 year old left-handed white female with a history of progressive supranuclear palsy. The patient has recently been seen by her ophthalmologist, Dr. Ellie Lunch, and she was found to have a left central retinal artery occlusion. The patient herself does not know when the visual change occurred. The patient has not had any headache, confusion, numbness or weakness on the face, arms, or legs. She denies any speech alteration or problems with swallowing that are new. She is nonambulatory, but this is a chronic feature of the progressive supranuclear palsy. The patient has not noted any temporal tenderness. She has not had any fevers, chills, or sudden weight loss. The patient is sent to this office for an evaluation. She has been started on low-dose aspirin.  Past Medical History  Diagnosis Date  . Cerebral degeneration   . Ataxia   . Gait disorder   . Progressive supranuclear palsy   . Subdural hematoma     Hx. of a left frontal  . 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 Other generalized ischemic cerebrovascular disease 10/14/09    Noted on CT and MR brain 10/14/09  . Dysphagia, oropharyngeal phase   . Cancer     Breast  . Malignant neoplasm of breast (female), unspecified site   . Central retinal artery occlusion 12/23/2013    Left eye    Past Surgical History  Procedure Laterality Date  . Mastectomy Right 1998    Dr. Rebekah Chesterfield  . Tonsillectomy    . Cataract extraction, bilateral      Family History  Problem Relation Age of Onset  . Other Mother     "Old Age"  . Cancer Father   . Cancer Brother     Colon    Social history:  reports that she quit smoking about 48 years ago. Her smoking use included Cigarettes. She smoked 0.00 packs per day. She has never used smokeless tobacco. She reports that  she does not drink alcohol or use illicit drugs.   No Known Allergies  Medications:  Current Outpatient Prescriptions on File Prior to Visit  Medication Sig Dispense Refill  . Calcium Carb-Cholecalciferol (CALTRATE 600+D) 600-800 MG-UNIT TABS Take 1 tablet by mouth.      . multivitamin-iron-minerals-folic acid (CENTRUM) chewable tablet Chew 1 tablet by mouth daily.      . polyvinyl alcohol (ARTIFICIAL TEARS) 1.4 % ophthalmic solution 1 drop as needed for dry eyes. *wait 2-5 minutes between eye drops , twice daily       No current facility-administered medications on file prior to visit.    ROS:  Out of a complete 14 system review of symptoms, the patient complains only of the following symptoms, and all other reviewed systems are negative.  Eye discharge, loss of vision, left eye Gait disorder Skin wounds  Blood pressure 130/72, pulse 60, weight 119 lb (53.978 kg).  Physical Exam  General: The patient is alert and cooperative at the time of the examination.  Skin: No significant peripheral edema is noted.   Neurologic Exam  Mental status: The patient is oriented x 3.  Cranial nerves: Facial symmetry is present. Speech is slightly dysarthric. Extraocular movements are significantly restricted in a vertical plane. The patient has difficulty tracking objects horizontally. Visual fields are full with the right  eye., the patient does not have count fingers with the left eye. Masking of the face is noted.  Motor: The patient has good strength in all 4 extremities.  Sensory examination: Soft touch sensation is symmetric on the face, arms, legs. Pinprick sensation is also symmetric throughout.  Coordination: The patient has good finger-nose-finger and heel-to-shin bilaterally.Rapid alternating movements are impaired bilaterally, somewhat worse on the right.  Gait and station: The patient is not ambulatory, she is in a wheelchair, gait was not tested.  Reflexes: Deep tendon  reflexes are symmetric.   Assessment/Plan:  One. Left central retinal artery occlusion  2. Progressive supranuclear palsy  3. Gait disturbance  The patient has had painless onset of visual loss with the left eye, the actual time of occurrence is unknown. The patient will be set up for a sedimentation rate today, and she will have carotid Doppler studies and MRI evaluation of the brain. She will remain on aspirin at this time. The patient will followup for her usual appointment in November 2015.  Jill Alexanders MD 12/23/2013 8:32 PM  [facility] [address] [place], [place] [address]  Phone [phone] Fax [phone]

## 2013-12-24 LAB — SEDIMENTATION RATE: SED RATE: 7 mm/h (ref 0–40)

## 2013-12-24 NOTE — Progress Notes (Signed)
Quick Note:  I called and gave results to her POA, normal lab. She verbalized understanding. Asked to call [facility] [phone] and give results to them. Done. ______

## 2013-12-25 ENCOUNTER — Encounter: Payer: Self-pay | Admitting: Nurse Practitioner

## 2013-12-25 DIAGNOSIS — H3412 Central retinal artery occlusion, left eye: Secondary | ICD-10-CM | POA: Insufficient documentation

## 2013-12-31 ENCOUNTER — Telehealth: Payer: Self-pay | Admitting: Radiology

## 2014-01-07 ENCOUNTER — Ambulatory Visit (INDEPENDENT_AMBULATORY_CARE_PROVIDER_SITE_OTHER): Payer: Medicare Other

## 2014-01-07 DIAGNOSIS — H3412 Central retinal artery occlusion, left eye: Secondary | ICD-10-CM

## 2014-01-07 DIAGNOSIS — G231 Progressive supranuclear ophthalmoplegia [Steele-Richardson-Olszewski]: Secondary | ICD-10-CM

## 2014-01-07 DIAGNOSIS — H3410 Central retinal artery occlusion, unspecified eye: Secondary | ICD-10-CM

## 2014-01-08 ENCOUNTER — Ambulatory Visit
Admission: RE | Admit: 2014-01-08 | Discharge: 2014-01-08 | Disposition: A | Discharge status: Home / Self Care | Payer: Medicare Other | Source: Ambulatory Visit | Attending: Neurology | Admitting: Neurology

## 2014-01-08 ENCOUNTER — Telehealth: Payer: Self-pay | Admitting: Radiology

## 2014-01-08 DIAGNOSIS — H3412 Central retinal artery occlusion, left eye: Secondary | ICD-10-CM

## 2014-01-08 DIAGNOSIS — H5310 Unspecified subjective visual disturbances: Secondary | ICD-10-CM

## 2014-01-08 DIAGNOSIS — G231 Progressive supranuclear ophthalmoplegia [Steele-Richardson-Olszewski]: Secondary | ICD-10-CM

## 2014-01-08 NOTE — Telephone Encounter (Signed)
Carotid doppler completed on 01/07/14  sf

## 2014-01-11 ENCOUNTER — Telehealth: Payer: Self-pay | Admitting: Neurology

## 2014-01-11 NOTE — Telephone Encounter (Signed)
I called the patient, talked with her POA. The carotid Doppler study was unremarkable. The patient will stay on aspirin for now.

## 2014-01-11 NOTE — Telephone Encounter (Signed)
  I called patient. The MRI the brain shows no change from 2011. The sedimentation rate was normal at 7. Carotid Doppler study has been done, results are pending. I will call when I get the results.  MRI brain 01/11/2014:  Impression   Abnormal MRI scan of the brain showing moderate changes of  generalized cerebral atrophy and mild changes of chronic microvascular  ischemia . No significant change compared with previous MRI scan from 2011

## 2014-01-12 ENCOUNTER — Encounter: Payer: Self-pay | Admitting: Nurse Practitioner

## 2014-01-27 ENCOUNTER — Emergency Department (HOSPITAL_COMMUNITY): Payer: Medicare Other

## 2014-01-27 ENCOUNTER — Encounter (HOSPITAL_COMMUNITY): Payer: Self-pay | Admitting: Emergency Medicine

## 2014-01-27 ENCOUNTER — Inpatient Hospital Stay (HOSPITAL_COMMUNITY)
Admission: EM | Admit: 2014-01-27 | Discharge: 2014-01-29 | DRG: 200 | Disposition: A | Discharge status: Skilled Nursing Facility | Payer: Medicare Other | Attending: General Surgery | Admitting: General Surgery

## 2014-01-27 DIAGNOSIS — Z87891 Personal history of nicotine dependence: Secondary | ICD-10-CM

## 2014-01-27 DIAGNOSIS — G231 Progressive supranuclear ophthalmoplegia [Steele-Richardson-Olszewski]: Secondary | ICD-10-CM | POA: Diagnosis present

## 2014-01-27 DIAGNOSIS — Z9011 Acquired absence of right breast and nipple: Secondary | ICD-10-CM | POA: Diagnosis present

## 2014-01-27 DIAGNOSIS — G311 Senile degeneration of brain, not elsewhere classified: Secondary | ICD-10-CM | POA: Diagnosis present

## 2014-01-27 DIAGNOSIS — H3412 Central retinal artery occlusion, left eye: Secondary | ICD-10-CM | POA: Diagnosis present

## 2014-01-27 DIAGNOSIS — S270XXA Traumatic pneumothorax, initial encounter: Principal | ICD-10-CM | POA: Diagnosis present

## 2014-01-27 DIAGNOSIS — R270 Ataxia, unspecified: Secondary | ICD-10-CM | POA: Diagnosis present

## 2014-01-27 DIAGNOSIS — Z9181 History of falling: Secondary | ICD-10-CM | POA: Diagnosis not present

## 2014-01-27 DIAGNOSIS — S0081XA Abrasion of other part of head, initial encounter: Secondary | ICD-10-CM | POA: Diagnosis present

## 2014-01-27 DIAGNOSIS — S12001A Unspecified nondisplaced fracture of first cervical vertebra, initial encounter for closed fracture: Secondary | ICD-10-CM | POA: Diagnosis present

## 2014-01-27 DIAGNOSIS — C50911 Malignant neoplasm of unspecified site of right female breast: Secondary | ICD-10-CM | POA: Diagnosis present

## 2014-01-27 DIAGNOSIS — S2231XA Fracture of one rib, right side, initial encounter for closed fracture: Secondary | ICD-10-CM | POA: Diagnosis present

## 2014-01-27 DIAGNOSIS — R1312 Dysphagia, oropharyngeal phase: Secondary | ICD-10-CM | POA: Diagnosis present

## 2014-01-27 DIAGNOSIS — J939 Pneumothorax, unspecified: Secondary | ICD-10-CM | POA: Diagnosis present

## 2014-01-27 DIAGNOSIS — W1839XA Other fall on same level, initial encounter: Secondary | ICD-10-CM | POA: Diagnosis present

## 2014-01-27 HISTORY — DX: Progressive supranuclear ophthalmoplegia (steele-Richardson-olszewski): G23.1

## 2014-01-27 LAB — CBC
HCT: 40.3 % (ref 36.0–46.0)
HEMOGLOBIN: 13.4 g/dL (ref 12.0–15.0)
MCH: 30.6 pg (ref 26.0–34.0)
MCHC: 33.3 g/dL (ref 30.0–36.0)
MCV: 92 fL (ref 78.0–100.0)
PLATELETS: 273 10*3/uL (ref 150–400)
RBC: 4.38 MIL/uL (ref 3.87–5.11)
RDW: 12.7 % (ref 11.5–15.5)
WBC: 8.1 10*3/uL (ref 4.0–10.5)

## 2014-01-27 LAB — BASIC METABOLIC PANEL
ANION GAP: 14 (ref 5–15)
BUN: 26 mg/dL — ABNORMAL HIGH (ref 6–23)
CO2: 21 meq/L (ref 19–32)
Calcium: 9.5 mg/dL (ref 8.4–10.5)
Chloride: 103 mEq/L (ref 96–112)
Creatinine, Ser: 0.81 mg/dL (ref 0.50–1.10)
GFR calc Af Amer: 73 mL/min — ABNORMAL LOW (ref >90)
GFR, EST NON AFRICAN AMERICAN: 63 mL/min — AB (ref >90)
GLUCOSE: 93 mg/dL (ref 70–99)
POTASSIUM: 4 meq/L (ref 3.7–5.3)
SODIUM: 138 meq/L (ref 137–147)

## 2014-01-27 MED ORDER — ONDANSETRON HCL 4 MG PO TABS: 4.0000 mg | ORAL_TABLET | Freq: Four times a day (QID) | ORAL | Status: DC | PRN

## 2014-01-27 MED ORDER — CALCIUM CARB-CHOLECALCIFEROL 600-800 MG-UNIT PO TABS: 1.0000 | ORAL_TABLET | Freq: Every day | ORAL | Status: DC

## 2014-01-27 MED ORDER — CENTRUM PO CHEW: 1.0000 | CHEWABLE_TABLET | Freq: Every day | ORAL | Status: DC

## 2014-01-27 MED ORDER — ONDANSETRON HCL 4 MG/2ML IJ SOLN: 4.0000 mg | Freq: Four times a day (QID) | INTRAMUSCULAR | Status: DC | PRN

## 2014-01-27 MED ORDER — HYDROCODONE-ACETAMINOPHEN 5-325 MG PO TABS: 1.0000 | ORAL_TABLET | ORAL | Status: DC | PRN

## 2014-01-27 MED ORDER — ASPIRIN 81 MG PO TABS: 81.0000 mg | ORAL_TABLET | Freq: Every day | ORAL | Status: DC

## 2014-01-27 MED ORDER — CALCIUM CARBONATE-VITAMIN D 500-200 MG-UNIT PO TABS
1.0000 | ORAL_TABLET | Freq: Every day | ORAL | Status: DC
  Administered 2014-01-28 – 2014-01-29 (×2): 1 via ORAL
  Filled 2014-01-27 (×2): qty 1

## 2014-01-27 MED ORDER — ASPIRIN EC 81 MG PO TBEC
81.0000 mg | DELAYED_RELEASE_TABLET | Freq: Every day | ORAL | Status: DC
  Administered 2014-01-28 – 2014-01-29 (×2): 81 mg via ORAL
  Filled 2014-01-27 (×2): qty 1

## 2014-01-27 MED ORDER — TAB-A-VITE/IRON PO TABS
1.0000 | ORAL_TABLET | Freq: Every day | ORAL | Status: DC
  Administered 2014-01-28 – 2014-01-29 (×2): 1 via ORAL
  Filled 2014-01-27 (×2): qty 1

## 2014-01-27 MED ORDER — HEPARIN SODIUM (PORCINE) 5000 UNIT/ML IJ SOLN
5000.0000 [IU] | Freq: Three times a day (TID) | INTRAMUSCULAR | Status: DC
  Administered 2014-01-27 – 2014-01-29 (×5): 5000 [IU] via SUBCUTANEOUS
  Filled 2014-01-27 (×7): qty 1

## 2014-01-27 MED ORDER — POLYVINYL ALCOHOL 1.4 % OP SOLN
1.0000 [drp] | OPHTHALMIC | Status: DC | PRN
  Filled 2014-01-27: qty 15

## 2014-01-27 MED ORDER — DOCUSATE SODIUM 100 MG PO CAPS
100.0000 mg | ORAL_CAPSULE | Freq: Two times a day (BID) | ORAL | Status: DC
  Administered 2014-01-27 – 2014-01-29 (×4): 100 mg via ORAL
  Filled 2014-01-27 (×3): qty 1

## 2014-01-27 MED ORDER — SODIUM CHLORIDE 0.9 % IV SOLN
INTRAVENOUS | Status: DC
  Administered 2014-01-27: 21:00:00 via INTRAVENOUS

## 2014-01-27 NOTE — ED Notes (Signed)
Per [facility]- Pt reside [place]. DNR YELLOW COPY/ MOST [place] witnessed fall. Pt found in chair with 2-3 inch laceration left side of forehead. Pt reports transferring from bed to wheelchair and fell. Pt reports NO LOC. Denies neck and back pain. Pt will hold her neck yet states NO neck pain. Denies N/V. Pt denies any other complaint and assessed no other injuries upon arrival on scene. Pt [facility] CLEARED ON SCENE. GCS 15 PEARL

## 2014-01-27 NOTE — ED Notes (Signed)
MD at bedside. 

## 2014-01-27 NOTE — ED Provider Notes (Signed)
CSN: [id number]     Arrival date & time 01/27/14  1310 History   First MD Initiated Contact with Patient 01/27/14 1332     Chief Complaint  Patient presents with  . Fall  . Facial Laceration     (Consider location/radiation/quality/duration/timing/severity/associated sxs/prior Treatment) Patient is a 80 y.o. female presenting with fall. The history is provided by the patient.  Fall This is a new problem. Pertinent negatives include no chest pain, no abdominal pain and no shortness of breath.   patient reportedly fell transferring from bed to wheelchair. She struck her head without loss of conscious. There is swelling to her head and had mild bleeding. No confusion. No neck pain. No numbness or chest pain. No trouble breathing. No abdominal pain. She is not on anticoagulation.  Past Medical History  Diagnosis Date  . Cerebral degeneration   . Ataxia   . Gait disorder   . Progressive supranuclear palsy   . Subdural hematoma     Hx. of a left frontal  . 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 Other generalized ischemic cerebrovascular disease 10/14/09    Noted on CT and MR brain 10/14/09  . Dysphagia, oropharyngeal phase   . Cancer     Breast  . Malignant neoplasm of breast (female), unspecified site   . Central retinal artery occlusion 12/23/2013    Left eye  . Supranuclear palsy    Past Surgical History  Procedure Laterality Date  . Mastectomy Right 1998    Dr. Rebekah Chesterfield  . Tonsillectomy    . Cataract extraction, bilateral     Family History  Problem Relation Age of Onset  . Other Mother     "Old Age"  . Cancer Father   . Cancer Brother     Colon   History  Substance Use Topics  . Smoking status: Former Smoker    Types: Cigarettes    Quit date: 05/04/1965  . Smokeless tobacco: Never Used  . Alcohol Use: No     Comment: Quit drinking   OB History   Grav Para Term Preterm Abortions TAB SAB Ect Mult Living                  Review of Systems  Constitutional: Negative for diaphoresis and appetite change.  Respiratory: Negative for shortness of breath.   Cardiovascular: Negative for chest pain.  Gastrointestinal: Negative for abdominal pain.  Genitourinary: Negative for flank pain.  Musculoskeletal: Negative for back pain and neck pain.  Skin: Positive for wound.  Psychiatric/Behavioral: Negative for confusion.      Allergies  Review of patient's allergies indicates no known allergies.  Home Medications   Prior to Admission medications   Medication Sig Start Date End Date Taking? Authorizing Provider  aspirin 81 MG tablet Take 81 mg by mouth daily.   Yes Historical Provider, MD  Calcium Carb-Cholecalciferol (CALTRATE 600+D) 600-800 MG-UNIT TABS Take 1 tablet by mouth.   Yes Historical Provider, MD  multivitamin-iron-minerals-folic acid (CENTRUM) chewable tablet Chew 1 tablet by mouth daily.   Yes Historical Provider, MD  polyvinyl alcohol (ARTIFICIAL TEARS) 1.4 % ophthalmic solution 1 drop as needed for dry eyes. *wait 2-5 minutes between eye drops , twice daily   Yes Historical Provider, MD   BP 109/57  Pulse 52  Temp(Src) 97.6 F (36.4 C) (Axillary)  Resp 16  Wt 123 lb 6.4 oz (55.974 kg)  SpO2 96% Physical Exam  Constitutional: She is oriented to person, place, and time. She appears well-developed and well-nourished.  HENT:  Head: Normocephalic.  Approximately 5 cm hematoma with abrasion to forehead above left eye. External ocular presents with mild decrease in vision up, chronic.  Eyes: Pupils are equal, round, and reactive to light.  Neck: Normal range of motion. Neck supple.  Cardiovascular: Normal rate and regular rhythm.   Pulmonary/Chest: Effort normal.  Abdominal: Soft.  Musculoskeletal: Normal range of motion.  Neurological: She is alert and oriented to person, place, and time.  Skin: Skin is warm.    ED Course  Procedures (including critical care time) Labs Review Labs  Reviewed  BASIC METABOLIC PANEL - Abnormal; Notable for the following:    BUN 26 (*)    GFR calc non Af Amer 63 (*)    GFR calc Af Amer 73 (*)    All other components within normal limits  CBC  CBC    Imaging Review Dg Chest 1 View  01/27/2014   CLINICAL DATA:  Fall while being transferred from mid well chair. History of breast carcinoma  EXAM: CHEST - 1 VIEW  COMPARISON:  October 14, 2009  FINDINGS: There is a mildly displaced fracture of the right posterior seventh rib. There is a focal pneumothorax in the right apex region without tension component. There is infiltrate in the right base. Left lung is clear. Heart size and pulmonary vascularity within normal limits. Patient is status post mastectomy with surgical clips the right axillary region. There is underlying emphysema.  IMPRESSION: Displaced right posterior seventh rib fracture with right apical pneumothorax. Right base infiltrate. Underlying emphysema. Left lung clear.  Critical Value/emergent results were called by telephone at the time of interpretation on 01/27/2014 at 3:36 pm to Dr. Davonna Belling , who verbally acknowledged these results.   Electronically Signed   By: Lowella Grip M.D.   On: 01/27/2014 15:36   Dg Chest 2 View  01/28/2014   CLINICAL DATA:  Pneumothorax  EXAM: CHEST  2 VIEW  COMPARISON:  Yesterday  FINDINGS: Stable right rib fracture. No pneumothorax. Right basilar volume loss stable. Upper normal heart size.  IMPRESSION: No or pneumothorax.  Stable right basilar volume loss   Electronically Signed   By: Maryclare Bean M.D.   On: 01/28/2014 08:05   Ct Head Wo Contrast  01/27/2014   ADDENDUM REPORT: 01/27/2014 16:16  ADDENDUM: The linear area in the base of the odontoid is quite subtle and potentially could represent vascular prominence as opposed to a fracture. Clinical assessment of this area advised with respect to this subtle finding.   Electronically Signed   By: Lowella Grip M.D.   On: 01/27/2014 16:16    01/27/2014   CLINICAL DATA:  Patient fell earlier today, currently presenting with headache, neck pain, and hematoma over left bony calvarium appears intact. Mastoids on the right are clear. There is diffuse opacification of the mastoids on the left. There is also extensive opacity throughout the left external and Frontal region  EXAM: CT HEAD WITHOUT CONTRAST  CT CERVICAL SPINE WITHOUT CONTRAST  TECHNIQUE: Multidetector CT imaging of the head and cervical spine was performed following the standard protocol without intravenous contrast. Multiplanar CT image reconstructions of the cervical spine were also generated.  COMPARISON:  September 25, 2013  FINDINGS: CT HEAD FINDINGS  Moderate diffuse atrophy is stable. There is no mass, hemorrhage, extra-axial fluid collection, or midline shift. There is mild patchy small vessel disease in the centra semiovale bilaterally.  There is no new gray-white compartment lesion. No acute infarct.  There is a scalp hematoma over the left frontal region. The bony calvarium appears intact. Mastoids on the right are clear. On the left, there is diffuse opacification of the left mastoid air cells as well as much of the left external auditory canal in essentially all left middle ear.  CT CERVICAL SPINE FINDINGS  There is an incomplete fracture along the proximal aspect of the odontoid, best seen on coronal slice 17 series 546 and sagittal slice 26 series 503. This finding is more subtle on axial slice 27 series 546. There is no other apparent fracture. There is slight anterolisthesis of C4 on C5 with slight retrolisthesis of C5 on C6 and slight anterolisthesis of C7 on T1, stable findings felt to be due to underlying spondylosis. Prevertebral soft tissues and predental space regions are normal. There is marked disc space narrowing at C4-5, C5-6, and C6-7. There is multilevel facet arthropathy, tending to be more severe overall on the right than on the left at several levels. No frank disc  extrusion or stenosis is seen. There are foci of carotid artery calcification bilaterally.  There is a sizable pleural effusion on the right which appears free-flowing.  IMPRESSION: CT head: Diffuse opacification of most of the left mastoid air cells as well as much of the left external auditory canal and essentially the entire left middle ear region. Question traumatic versus inflammatory etiology. No fracture or air-fluid level is seen in this region. Mastoids and external auditory canal/middle ear on the right are clear.  Atrophy with patchy small vessel disease, stable. No intracranial mass, hemorrhage, or extra-axial fluid. There is a left frontal scalp hematoma.  CT cervical spine: Incomplete fracture of the inferior aspect of the odontoid, type III. Alignment anatomic. No other fractures. Extensive arthropathy. Areas of spondylolisthesis appear stable under felt to be due to underlying spondylosis. There is a sizable free-flowing right pleural effusion.  Critical Value/emergent results were called by telephone at the time of interpretation on 01/27/2014 at 3:07 pm to Dr. Davonna Belling , who verbally acknowledged these results.  Electronically Signed: By: Lowella Grip M.D. On: 01/27/2014 15:07   Ct Cervical Spine Wo Contrast  01/27/2014   ADDENDUM REPORT: 01/27/2014 16:16  ADDENDUM: The linear area in the base of the odontoid is quite subtle and potentially could represent vascular prominence as opposed to a fracture. Clinical assessment of this area advised with respect to this subtle finding.   Electronically Signed   By: Lowella Grip M.D.   On: 01/27/2014 16:16   01/27/2014   CLINICAL DATA:  Patient fell earlier today, currently presenting with headache, neck pain, and hematoma over left bony calvarium appears intact. Mastoids on the right are clear. There is diffuse opacification of the mastoids on the left. There is also extensive opacity throughout the left external and Frontal region   EXAM: CT HEAD WITHOUT CONTRAST  CT CERVICAL SPINE WITHOUT CONTRAST  TECHNIQUE: Multidetector CT imaging of the head and cervical spine was performed following the standard protocol without intravenous contrast. Multiplanar CT image reconstructions of the cervical spine were also generated.  COMPARISON:  September 25, 2013  FINDINGS: CT HEAD FINDINGS  Moderate diffuse atrophy is stable. There is no mass, hemorrhage, extra-axial fluid collection, or midline shift. There is mild patchy small vessel disease in the centra semiovale bilaterally. There is no new gray-white compartment lesion. No acute infarct.  There is a scalp hematoma over the left frontal  region. The bony calvarium appears intact. Mastoids on the right are clear. On the left, there is diffuse opacification of the left mastoid air cells as well as much of the left external auditory canal in essentially all left middle ear.  CT CERVICAL SPINE FINDINGS  There is an incomplete fracture along the proximal aspect of the odontoid, best seen on coronal slice 17 series 947 and sagittal slice 26 series 096. This finding is more subtle on axial slice 27 series 283. There is no other apparent fracture. There is slight anterolisthesis of C4 on C5 with slight retrolisthesis of C5 on C6 and slight anterolisthesis of C7 on T1, stable findings felt to be due to underlying spondylosis. Prevertebral soft tissues and predental space regions are normal. There is marked disc space narrowing at C4-5, C5-6, and C6-7. There is multilevel facet arthropathy, tending to be more severe overall on the right than on the left at several levels. No frank disc extrusion or stenosis is seen. There are foci of carotid artery calcification bilaterally.  There is a sizable pleural effusion on the right which appears free-flowing.  IMPRESSION: CT head: Diffuse opacification of most of the left mastoid air cells as well as much of the left external auditory canal and essentially the entire left  middle ear region. Question traumatic versus inflammatory etiology. No fracture or air-fluid level is seen in this region. Mastoids and external auditory canal/middle ear on the right are clear.  Atrophy with patchy small vessel disease, stable. No intracranial mass, hemorrhage, or extra-axial fluid. There is a left frontal scalp hematoma.  CT cervical spine: Incomplete fracture of the inferior aspect of the odontoid, type III. Alignment anatomic. No other fractures. Extensive arthropathy. Areas of spondylolisthesis appear stable under felt to be due to underlying spondylosis. There is a sizable free-flowing right pleural effusion.  Critical Value/emergent results were called by telephone at the time of interpretation on 01/27/2014 at 3:07 pm to Dr. Davonna Belling , who verbally acknowledged these results.  Electronically Signed: By: Lowella Grip M.D. On: 01/27/2014 15:07     EKG Interpretation None      MDM   Final diagnoses:  Pneumothorax    Patient with fall. Hematoma the face, CT scans done due to age and initially read as C1 fracture. Patient was placed in cervical collar neurosurgery was consulted. They believe this may not be a actual fracture just a vascular channel. They have reviewed with radiology. There is no tenderness in felt unlikely that this is an actual fracture. The CT of the cervical spine however did show pleural effusion an x-ray was done. X-ray showed right seventh rib fracture with pneumothorax. Patient is no tenderness on the chest wall no crepitance. Trauma surgery/general surgery has been consulted    Jasper Riling. Alvino Chapel, MD 01/28/14 [phone]

## 2014-01-27 NOTE — ED Notes (Signed)
MD at bedside. EDP PICKERING IN TO SEE PT

## 2014-01-27 NOTE — ED Notes (Signed)
Bed: WA09 Expected date:  Expected time:  Means of arrival:  Comments: EMS- elderly, fall, head lac

## 2014-01-27 NOTE — H&P (Signed)
Monica Waller is an 80 y.o. female.    Chief Complaint: Fall  HPI: patient is an 80 year old female with a history of gait disorder and supranuclear palsy with frequent falls.  She is a resident in an assisted living facility. She states that while trying to walk from a chair to her bed a couple steps she fell and struck her head. She remembers the fall and there was no loss of consciousness. She was brought by EMS to the ED for evaluation. At the time of my valuation she has no complaints. She denies headache or neck pain. Denies chest pain or shortness of breath or any other complaints.  Past Medical History  Diagnosis Date  . Cerebral degeneration   . Ataxia   . Gait disorder   . Progressive supranuclear palsy   . Subdural hematoma     Hx. of a left frontal  . 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 Other generalized ischemic cerebrovascular disease 10/14/09    Noted on CT and MR brain 10/14/09  . Dysphagia, oropharyngeal phase   . Cancer     Breast  . Malignant neoplasm of breast (female), unspecified site   . Central retinal artery occlusion 12/23/2013    Left eye  . Supranuclear palsy     Past Surgical History  Procedure Laterality Date  . Mastectomy Right 1998    Dr. Rebekah Chesterfield  . Tonsillectomy    . Cataract extraction, bilateral      Family History  Problem Relation Age of Onset  . Other Mother     "Old Age"  . Cancer Father   . Cancer Brother     Colon   Social History:  reports that she quit smoking about 48 years ago. Her smoking use included Cigarettes. She smoked 0.00 packs per day. She has never used smokeless tobacco. She reports that she does not drink alcohol or use illicit drugs.  Allergies: No Known Allergies   No current facility-administered medications for this encounter.   Current Outpatient Prescriptions  Medication Sig Dispense Refill  . aspirin 81 MG tablet Take 81 mg by mouth daily.      . Calcium  Carb-Cholecalciferol (CALTRATE 600+D) 600-800 MG-UNIT TABS Take 1 tablet by mouth.      . multivitamin-iron-minerals-folic acid (CENTRUM) chewable tablet Chew 1 tablet by mouth daily.      . polyvinyl alcohol (ARTIFICIAL TEARS) 1.4 % ophthalmic solution 1 drop as needed for dry eyes. *wait 2-5 minutes between eye drops , twice daily         Results for orders placed during the hospital encounter of 01/27/14 (from the past 48 hour(s))  CBC     Status: None   Collection Time    01/27/14  4:47 PM      Result Value Ref Range   WBC 8.1  4.0 - 10.5 K/uL   RBC 4.38  3.87 - 5.11 MIL/uL   Hemoglobin 13.4  12.0 - 15.0 g/dL   HCT 40.3  36.0 - 46.0 %   MCV 92.0  78.0 - 100.0 fL   MCH 30.6  26.0 - 34.0 pg   MCHC 33.3  30.0 - 36.0 g/dL   RDW 12.7  11.5 - 15.5 %   Platelets 273  150 - 400 K/uL  BASIC METABOLIC PANEL     Status: Abnormal   Collection Time    01/27/14  4:47 PM  Result Value Ref Range   Sodium 138  137 - 147 mEq/L   Potassium 4.0  3.7 - 5.3 mEq/L   Chloride 103  96 - 112 mEq/L   CO2 21  19 - 32 mEq/L   Glucose, Bld 93  70 - 99 mg/dL   BUN 26 (*) 6 - 23 mg/dL   Creatinine, Ser 0.81  0.50 - 1.10 mg/dL   Calcium 9.5  8.4 - 10.5 mg/dL   GFR calc non Af Amer 63 (*) >90 mL/min   GFR calc Af Amer 73 (*) >90 mL/min   Comment: (NOTE)     The eGFR has been calculated using the CKD EPI equation.     This calculation has not been validated in all clinical situations.     eGFR's persistently <90 mL/min signify possible Chronic Kidney     Disease.   Anion gap 14  5 - 15   Dg Chest 1 View  01/27/2014   CLINICAL DATA:  Fall while being transferred from mid well chair. History of breast carcinoma  EXAM: CHEST - 1 VIEW  COMPARISON:  October 14, 2009  FINDINGS: There is a mildly displaced fracture of the right posterior seventh rib. There is a focal pneumothorax in the right apex region without tension component. There is infiltrate in the right base. Left lung is clear. Heart size and  pulmonary vascularity within normal limits. Patient is status post mastectomy with surgical clips the right axillary region. There is underlying emphysema.  IMPRESSION: Displaced right posterior seventh rib fracture with right apical pneumothorax. Right base infiltrate. Underlying emphysema. Left lung clear.  Critical Value/emergent results were called by telephone at the time of interpretation on 01/27/2014 at 3:36 pm to Dr. Davonna Belling , who verbally acknowledged these results.   Electronically Signed   By: Lowella Grip M.D.   On: 01/27/2014 15:36   Ct Head Wo Contrast  01/27/2014   ADDENDUM REPORT: 01/27/2014 16:16  ADDENDUM: The linear area in the base of the odontoid is quite subtle and potentially could represent vascular prominence as opposed to a fracture. Clinical assessment of this area advised with respect to this subtle finding.   Electronically Signed   By: Lowella Grip M.D.   On: 01/27/2014 16:16   01/27/2014   CLINICAL DATA:  Patient fell earlier today, currently presenting with headache, neck pain, and hematoma over left bony calvarium appears intact. Mastoids on the right are clear. There is diffuse opacification of the mastoids on the left. There is also extensive opacity throughout the left external and Frontal region  EXAM: CT HEAD WITHOUT CONTRAST  CT CERVICAL SPINE WITHOUT CONTRAST  TECHNIQUE: Multidetector CT imaging of the head and cervical spine was performed following the standard protocol without intravenous contrast. Multiplanar CT image reconstructions of the cervical spine were also generated.  COMPARISON:  September 25, 2013  FINDINGS: CT HEAD FINDINGS  Moderate diffuse atrophy is stable. There is no mass, hemorrhage, extra-axial fluid collection, or midline shift. There is mild patchy small vessel disease in the centra semiovale bilaterally. There is no new gray-white compartment lesion. No acute infarct.  There is a scalp hematoma over the left frontal region. The bony  calvarium appears intact. Mastoids on the right are clear. On the left, there is diffuse opacification of the left mastoid air cells as well as much of the left external auditory canal in essentially all left middle ear.  CT CERVICAL SPINE FINDINGS  There is an incomplete fracture along the  proximal aspect of the odontoid, best seen on coronal slice 17 series 834 and sagittal slice 26 series 196. This finding is more subtle on axial slice 27 series 222. There is no other apparent fracture. There is slight anterolisthesis of C4 on C5 with slight retrolisthesis of C5 on C6 and slight anterolisthesis of C7 on T1, stable findings felt to be due to underlying spondylosis. Prevertebral soft tissues and predental space regions are normal. There is marked disc space narrowing at C4-5, C5-6, and C6-7. There is multilevel facet arthropathy, tending to be more severe overall on the right than on the left at several levels. No frank disc extrusion or stenosis is seen. There are foci of carotid artery calcification bilaterally.  There is a sizable pleural effusion on the right which appears free-flowing.  IMPRESSION: CT head: Diffuse opacification of most of the left mastoid air cells as well as much of the left external auditory canal and essentially the entire left middle ear region. Question traumatic versus inflammatory etiology. No fracture or air-fluid level is seen in this region. Mastoids and external auditory canal/middle ear on the right are clear.  Atrophy with patchy small vessel disease, stable. No intracranial mass, hemorrhage, or extra-axial fluid. There is a left frontal scalp hematoma.  CT cervical spine: Incomplete fracture of the inferior aspect of the odontoid, type III. Alignment anatomic. No other fractures. Extensive arthropathy. Areas of spondylolisthesis appear stable under felt to be due to underlying spondylosis. There is a sizable free-flowing right pleural effusion.  Critical Value/emergent results  were called by telephone at the time of interpretation on 01/27/2014 at 3:07 pm to Dr. Davonna Belling , who verbally acknowledged these results.  Electronically Signed: By: Lowella Grip M.D. On: 01/27/2014 15:07   Ct Cervical Spine Wo Contrast  01/27/2014   ADDENDUM REPORT: 01/27/2014 16:16  ADDENDUM: The linear area in the base of the odontoid is quite subtle and potentially could represent vascular prominence as opposed to a fracture. Clinical assessment of this area advised with respect to this subtle finding.   Electronically Signed   By: Lowella Grip M.D.   On: 01/27/2014 16:16   01/27/2014   CLINICAL DATA:  Patient fell earlier today, currently presenting with headache, neck pain, and hematoma over left bony calvarium appears intact. Mastoids on the right are clear. There is diffuse opacification of the mastoids on the left. There is also extensive opacity throughout the left external and Frontal region  EXAM: CT HEAD WITHOUT CONTRAST  CT CERVICAL SPINE WITHOUT CONTRAST  TECHNIQUE: Multidetector CT imaging of the head and cervical spine was performed following the standard protocol without intravenous contrast. Multiplanar CT image reconstructions of the cervical spine were also generated.  COMPARISON:  September 25, 2013  FINDINGS: CT HEAD FINDINGS  Moderate diffuse atrophy is stable. There is no mass, hemorrhage, extra-axial fluid collection, or midline shift. There is mild patchy small vessel disease in the centra semiovale bilaterally. There is no new gray-white compartment lesion. No acute infarct.  There is a scalp hematoma over the left frontal region. The bony calvarium appears intact. Mastoids on the right are clear. On the left, there is diffuse opacification of the left mastoid air cells as well as much of the left external auditory canal in essentially all left middle ear.  CT CERVICAL SPINE FINDINGS  There is an incomplete fracture along the proximal aspect of the odontoid, best seen on  coronal slice 17 series 979 and sagittal slice 26 series 892. This  finding is more subtle on axial slice 27 series 540. There is no other apparent fracture. There is slight anterolisthesis of C4 on C5 with slight retrolisthesis of C5 on C6 and slight anterolisthesis of C7 on T1, stable findings felt to be due to underlying spondylosis. Prevertebral soft tissues and predental space regions are normal. There is marked disc space narrowing at C4-5, C5-6, and C6-7. There is multilevel facet arthropathy, tending to be more severe overall on the right than on the left at several levels. No frank disc extrusion or stenosis is seen. There are foci of carotid artery calcification bilaterally.  There is a sizable pleural effusion on the right which appears free-flowing.  IMPRESSION: CT head: Diffuse opacification of most of the left mastoid air cells as well as much of the left external auditory canal and essentially the entire left middle ear region. Question traumatic versus inflammatory etiology. No fracture or air-fluid level is seen in this region. Mastoids and external auditory canal/middle ear on the right are clear.  Atrophy with patchy small vessel disease, stable. No intracranial mass, hemorrhage, or extra-axial fluid. There is a left frontal scalp hematoma.  CT cervical spine: Incomplete fracture of the inferior aspect of the odontoid, type III. Alignment anatomic. No other fractures. Extensive arthropathy. Areas of spondylolisthesis appear stable under felt to be due to underlying spondylosis. There is a sizable free-flowing right pleural effusion.  Critical Value/emergent results were called by telephone at the time of interpretation on 01/27/2014 at 3:07 pm to Dr. Davonna Belling , who verbally acknowledged these results.  Electronically Signed: By: Lowella Grip M.D. On: 01/27/2014 15:07    ROS  Blood pressure 143/71, pulse 82, temperature 97.6 F (36.4 C), temperature source Oral, resp. rate 19, SpO2  98.00%. Physical Exam  General: Alert, elderly Caucasian female, slowed speech but conversant, in no distress Skin: Warm and dry without rash or infection. HEENT: No palpable masses or thyromegaly. Sclera nonicteric. Pupils equal round and reactive. Oropharynx clear. There is a 5 or 6 cm superficial abrasion and hematoma at the left fore head. No other facial tenderness or instability or swelling Lymph nodes: No cervical, supraclavicular, axillary, or inguinal nodes palpable. Breasts: Status post right mastectomy with well-healed incision. Left breast negative Chest wall, no crepitance or tenderness of the chest. No bruising Lungs: Breath sounds clear and equal without increased work of breathing Cardiovascular: Regular rate and rhythm without murmur. No JVD or edema. Abdomen: Nondistended. Soft and nontender. No masses palpable. No organomegaly. No palpable hernias. Extremities: No edema or joint swelling or deformity. She has good motion of all 4 extremities without pain. Neurologic: Alert and fully oriented. Speech is very slowed but intelligible and normal thought content. No focal weakness.  Assessment/Plan Patient with chronic neurologic problems and frequent falls. Fall at the nursing home with #1 right rib fracture and apical pneumothorax small effusion   #2 forehead hematoma and abrasion Initial reading of the CT of the neck indicated odontoid fracture but further review by neurosurgery for ED p. Reveals no fracture. She has no neck tenderness or pain. Patient will be admitted to the trauma service for observation due to her pneumothorax. Pulmonary toilet. Out of bed as tolerated. Repeat chest x-ray tomorrow.  Jazzalynn Rhudy T 01/27/2014, 5:45 PM

## 2014-01-28 ENCOUNTER — Inpatient Hospital Stay (HOSPITAL_COMMUNITY): Payer: Medicare Other

## 2014-01-28 LAB — CBC
HCT: 38.8 % (ref 36.0–46.0)
HEMOGLOBIN: 13.3 g/dL (ref 12.0–15.0)
MCH: 31.8 pg (ref 26.0–34.0)
MCHC: 34.3 g/dL (ref 30.0–36.0)
MCV: 92.8 fL (ref 78.0–100.0)
Platelets: 241 10*3/uL (ref 150–400)
RBC: 4.18 MIL/uL (ref 3.87–5.11)
RDW: 13 % (ref 11.5–15.5)
WBC: 6.6 10*3/uL (ref 4.0–10.5)

## 2014-01-28 LAB — MRSA PCR SCREENING: MRSA BY PCR: NEGATIVE

## 2014-01-28 NOTE — Progress Notes (Signed)
Patient ID: Monica Waller, female   DOB: 20-Mar-1924, 80 y.o.   MRN: [id number] Neck NT but conflicting read on CT regarding possible odontoid FX. Will ask Neurosurgery to evaluate. Georganna Skeans, MD, MPH, FACS Trauma: [phone] General Surgery: [phone]

## 2014-01-28 NOTE — Clinical Social Work Note (Signed)
CSW faxed clinicals to [facility] for authorization.  Still require OT notes for insurance auth.  CSW will continue to follow.  Domenica Reamer, [place] Social Worker [phone]

## 2014-01-28 NOTE — Progress Notes (Signed)
PT Cancellation Note  Patient Details Name: SENG FOUTS MRN: [id number] DOB: 08/26/1923   Cancelled Treatment:    Reason Eval/Treat Not Completed: Other (comment) (Pt eating lunch and has been >30 minutes.) Will try again after she has finished lunch.   Cherith Tewell 01/28/2014, 2:14 PM

## 2014-01-28 NOTE — Progress Notes (Signed)
I have asked Dr. Ellene Route to evaluate the ? Odontoid FX. Therapy eval with plan for SNF placement at [facility]. Patient examined and I agree with the assessment and plan  Georganna Skeans, MD, MPH, FACS Trauma: [phone] General Surgery: [phone]  01/28/2014 2:45 PM

## 2014-01-28 NOTE — Clinical Social Work Note (Addendum)
11:30 CSW spoke with admissions at [facility] who stated that since patient is unable to complete ADLs she will  Need SNF placement.  [facility] reports that they can take her on the SNF side but will need PT notes to admit.  CSW informed PA who reported they will put in PT orders.  CSW unable to complete SBIRT assessment  10:00 CSW  Spoke with [place] (POA).  Stanton Kidney stated that she is able to pick up the patient and requested that the patient be brought down to the [facility] entrance around 2pm for pick up.    Stanton Kidney confirmed that patient is from [facility] and wishes patient to return there at discharge.  CSW left message at [place] will continue to follow.  Domenica Reamer, [place] Social Worker [phone]

## 2014-01-28 NOTE — Progress Notes (Signed)
Patient ID: Monica Waller, female   DOB: 1923/09/12, 80 y.o.   MRN: [id number]  LOS: 1 day   Subjective: No sob, pain.    Objective: Vital signs in last 24 hours: Temp:  [97.3 F (36.3 C)-98 F (36.7 C)] 97.6 F (36.4 C) (10/08 0900) Pulse Rate:  [52-84] 72 (10/08 0900) Resp:  [15-22] 16 (10/08 0900) BP: (109-153)/(55-71) 120/60 mmHg (10/08 0900) SpO2:  [95 %-99 %] 98 % (10/08 0900) Weight:  [123 lb 6.4 oz (55.974 kg)] 123 lb 6.4 oz (55.974 kg) (10/07 2107) Last BM Date: 01/27/14  Lab Results:  CBC  Recent Labs  01/27/14 1647 01/28/14 0500  WBC 8.1 6.6  HGB 13.4 13.3  HCT 40.3 38.8  PLT 273 241   BMET  Recent Labs  01/27/14 1647  NA 138  K 4.0  CL 103  CO2 21  GLUCOSE 93  BUN 26*  CREATININE 0.81  CALCIUM 9.5    Imaging: Dg Chest 1 View  01/27/2014   CLINICAL DATA:  Fall while being transferred from [facility]. History of breast carcinoma  EXAM: CHEST - 1 VIEW  COMPARISON:  October 14, 2009  FINDINGS: There is a mildly displaced fracture of the right posterior seventh rib. There is a focal pneumothorax in the right apex region without tension component. There is infiltrate in the right base. Left lung is clear. Heart size and pulmonary vascularity within normal limits. Patient is status post mastectomy with surgical clips the right axillary region. There is underlying emphysema.  IMPRESSION: Displaced right posterior seventh rib fracture with right apical pneumothorax. Right base infiltrate. Underlying emphysema. Left lung clear.  Critical Value/emergent results were called by telephone at the time of interpretation on 01/27/2014 at 3:36 pm to Dr. Davonna Belling , who verbally acknowledged these results.   Electronically Signed   By: Lowella Grip M.D.   On: 01/27/2014 15:36   Dg Chest 2 View  01/28/2014   CLINICAL DATA:  Pneumothorax  EXAM: CHEST  2 VIEW  COMPARISON:  Yesterday  FINDINGS: Stable right rib fracture. No pneumothorax. Right basilar volume loss  stable. Upper normal heart size.  IMPRESSION: No or pneumothorax.  Stable right basilar volume loss   Electronically Signed   By: Maryclare Bean M.D.   On: 01/28/2014 08:05   Ct Head Wo Contrast  01/27/2014   ADDENDUM REPORT: 01/27/2014 16:16  ADDENDUM: The linear area in the base of the odontoid is quite subtle and potentially could represent vascular prominence as opposed to a fracture. Clinical assessment of this area advised with respect to this subtle finding.   Electronically Signed   By: Lowella Grip M.D.   On: 01/27/2014 16:16   01/27/2014   CLINICAL DATA:  Patient fell earlier today, currently presenting with headache, neck pain, and hematoma over left bony calvarium appears intact. Mastoids on the right are clear. There is diffuse opacification of the mastoids on the left. There is also extensive opacity throughout the left external and Frontal region  EXAM: CT HEAD WITHOUT CONTRAST  CT CERVICAL SPINE WITHOUT CONTRAST  TECHNIQUE: Multidetector CT imaging of the head and cervical spine was performed following the standard protocol without intravenous contrast. Multiplanar CT image reconstructions of the cervical spine were also generated.  COMPARISON:  September 25, 2013  FINDINGS: CT HEAD FINDINGS  Moderate diffuse atrophy is stable. There is no mass, hemorrhage, extra-axial fluid collection, or midline shift. There is mild patchy small vessel disease in the centra semiovale bilaterally. There  is no new gray-white compartment lesion. No acute infarct.  There is a scalp hematoma over the left frontal region. The bony calvarium appears intact. Mastoids on the right are clear. On the left, there is diffuse opacification of the left mastoid air cells as well as much of the left external auditory canal in essentially all left middle ear.  CT CERVICAL SPINE FINDINGS  There is an incomplete fracture along the proximal aspect of the odontoid, best seen on coronal slice 17 series 573 and sagittal slice 26 series 220.  This finding is more subtle on axial slice 27 series 254. There is no other apparent fracture. There is slight anterolisthesis of C4 on C5 with slight retrolisthesis of C5 on C6 and slight anterolisthesis of C7 on T1, stable findings felt to be due to underlying spondylosis. Prevertebral soft tissues and predental space regions are normal. There is marked disc space narrowing at C4-5, C5-6, and C6-7. There is multilevel facet arthropathy, tending to be more severe overall on the right than on the left at several levels. No frank disc extrusion or stenosis is seen. There are foci of carotid artery calcification bilaterally.  There is a sizable pleural effusion on the right which appears free-flowing.  IMPRESSION: CT head: Diffuse opacification of most of the left mastoid air cells as well as much of the left external auditory canal and essentially the entire left middle ear region. Question traumatic versus inflammatory etiology. No fracture or air-fluid level is seen in this region. Mastoids and external auditory canal/middle ear on the right are clear.  Atrophy with patchy small vessel disease, stable. No intracranial mass, hemorrhage, or extra-axial fluid. There is a left frontal scalp hematoma.  CT cervical spine: Incomplete fracture of the inferior aspect of the odontoid, type III. Alignment anatomic. No other fractures. Extensive arthropathy. Areas of spondylolisthesis appear stable under felt to be due to underlying spondylosis. There is a sizable free-flowing right pleural effusion.  Critical Value/emergent results were called by telephone at the time of interpretation on 01/27/2014 at 3:07 pm to Dr. Davonna Belling , who verbally acknowledged these results.  Electronically Signed: By: Lowella Grip M.D. On: 01/27/2014 15:07   Ct Cervical Spine Wo Contrast  01/27/2014   ADDENDUM REPORT: 01/27/2014 16:16  ADDENDUM: The linear area in the base of the odontoid is quite subtle and potentially could  represent vascular prominence as opposed to a fracture. Clinical assessment of this area advised with respect to this subtle finding.   Electronically Signed   By: Lowella Grip M.D.   On: 01/27/2014 16:16   01/27/2014   CLINICAL DATA:  Patient fell earlier today, currently presenting with headache, neck pain, and hematoma over left bony calvarium appears intact. Mastoids on the right are clear. There is diffuse opacification of the mastoids on the left. There is also extensive opacity throughout the left external and Frontal region  EXAM: CT HEAD WITHOUT CONTRAST  CT CERVICAL SPINE WITHOUT CONTRAST  TECHNIQUE: Multidetector CT imaging of the head and cervical spine was performed following the standard protocol without intravenous contrast. Multiplanar CT image reconstructions of the cervical spine were also generated.  COMPARISON:  September 25, 2013  FINDINGS: CT HEAD FINDINGS  Moderate diffuse atrophy is stable. There is no mass, hemorrhage, extra-axial fluid collection, or midline shift. There is mild patchy small vessel disease in the centra semiovale bilaterally. There is no new gray-white compartment lesion. No acute infarct.  There is a scalp hematoma over the left frontal region.  The bony calvarium appears intact. Mastoids on the right are clear. On the left, there is diffuse opacification of the left mastoid air cells as well as much of the left external auditory canal in essentially all left middle ear.  CT CERVICAL SPINE FINDINGS  There is an incomplete fracture along the proximal aspect of the odontoid, best seen on coronal slice 17 series 803 and sagittal slice 26 series 212. This finding is more subtle on axial slice 27 series 248. There is no other apparent fracture. There is slight anterolisthesis of C4 on C5 with slight retrolisthesis of C5 on C6 and slight anterolisthesis of C7 on T1, stable findings felt to be due to underlying spondylosis. Prevertebral soft tissues and predental space regions are  normal. There is marked disc space narrowing at C4-5, C5-6, and C6-7. There is multilevel facet arthropathy, tending to be more severe overall on the right than on the left at several levels. No frank disc extrusion or stenosis is seen. There are foci of carotid artery calcification bilaterally.  There is a sizable pleural effusion on the right which appears free-flowing.  IMPRESSION: CT head: Diffuse opacification of most of the left mastoid air cells as well as much of the left external auditory canal and essentially the entire left middle ear region. Question traumatic versus inflammatory etiology. No fracture or air-fluid level is seen in this region. Mastoids and external auditory canal/middle ear on the right are clear.  Atrophy with patchy small vessel disease, stable. No intracranial mass, hemorrhage, or extra-axial fluid. There is a left frontal scalp hematoma.  CT cervical spine: Incomplete fracture of the inferior aspect of the odontoid, type III. Alignment anatomic. No other fractures. Extensive arthropathy. Areas of spondylolisthesis appear stable under felt to be due to underlying spondylosis. There is a sizable free-flowing right pleural effusion.  Critical Value/emergent results were called by telephone at the time of interpretation on 01/27/2014 at 3:07 pm to Dr. Davonna Belling , who verbally acknowledged these results.  Electronically Signed: By: Lowella Grip M.D. On: 01/27/2014 15:07     PE: General appearance: alert, cooperative and no distress Neck: no ttp, good ROM Resp: clear to auscultation bilaterally Cardio: regular rate and rhythm, S1, S2 normal, no murmur, click, rub or gallop GI: soft, non-tender; bowel sounds normal; no masses,  no organomegaly Extremities: extremities normal, atraumatic, no cyanosis or edema     Patient Active Problem List   Diagnosis Date Noted  . Pneumothorax 01/27/2014  . Central retinal artery occlusion, left eye 12/25/2013  . Central  retinal artery occlusion 12/23/2013  . Blurred vision 12/15/2013  . Laceration of left eyebrow without complication 25/00/3704  . Blepharitis 08/18/2013  . Dysphagia, oropharyngeal phase   . Malignant neoplasm of breast (female), unspecified site   . Gait disorder   . UTI (lower urinary tract infection) 01/18/2013  . Hematoma 01/18/2013  . Fall 01/18/2013  . Anemia 06/04/2012  . Abnormality of gait 06/04/2012  . Progressive supranuclear palsy 06/04/2012  . Other general symptoms 06/04/2012  . Other generalized ischemic cerebrovascular disease 10/14/2009  . 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 Right rib fracture/PTX-repeat CXR this AM does not reveal a PTX.  No pain.  Will add IS. Left scalp hematoma/abrasion-local care Progressive supranuclear palsy/ataxia/gait disturbance-WC bound, frequent falls.  This is her baseline.   VTE - SCD's, heparin FEN - D3 diet Dispo -- back  to [facility].  I spoke with her POA/friend Daune Perch, ANP-BC Pager: [place] Pager: [phone]   01/28/2014 10:36 AM

## 2014-01-28 NOTE — Evaluation (Signed)
Physical Therapy Evaluation Patient Details Name: Monica Waller MRN: [id number] DOB: 07/14/1923 Today's Date: 01/28/2014   History of Present Illness  Pt adm after fall at [facility]. Pt suffered rt rib fx and small pneumothorax. Pt with also ?odontoid fx. Pt with hx of progressive supranuclear palsy.  Clinical Impression  Pt admitted with above. Pt currently with functional limitations due to the deficits listed below (see PT Problem List).  Pt will benefit from skilled PT to increase their independence and safety with mobility to allow discharge to the venue listed below. Feel pt needs SNF level of care at [facility].      Follow Up Recommendations SNF    Equipment Recommendations  None recommended by PT    Recommendations for Other Services       Precautions / Restrictions Precautions Precautions: Fall;Cervical Precaution Comments: Trauma has consulted neurosurgery on possible odontoid fx. Required Braces or Orthoses: Cervical Brace Cervical Brace: Hard collar;At all times      Mobility  Bed Mobility Overal bed mobility: Needs Assistance Bed Mobility: Rolling;Sidelying to Sit Rolling: Min guard Sidelying to sit: Min assist       General bed mobility comments: Assist to bring trunk up and hips to EOB.  Transfers Overall transfer level: Needs assistance Equipment used: Rolling walker (2 wheeled) Transfers: Sit to/from Stand Sit to Stand: Min assist         General transfer comment: Assist to bring hips up and for balance. Verbal cues for hand placement.  Ambulation/Gait Ambulation/Gait assistance: Min assist Ambulation Distance (Feet): 5 Feet Assistive device: Rolling walker (2 wheeled) Gait Pattern/deviations: Step-through pattern;Scissoring;Ataxic;Narrow base of support Gait velocity: decr Gait velocity interpretation: Below normal speed for age/gender General Gait Details: Assist for balance and verbal/tactile cues to widen base of  support. Distance limited by pt being incontinent of stool.  Stairs            Wheelchair Mobility    Modified Rankin (Stroke Patients Only)       Balance Overall balance assessment: Needs assistance Sitting-balance support: No upper extremity supported;Feet supported Sitting balance-Leahy Scale: Fair     Standing balance support: Bilateral upper extremity supported Standing balance-Leahy Scale: Poor Standing balance comment: Pt requires support of walker for balance. Pt stood multiple times to be cleaned after stool.                             Pertinent Vitals/Pain Pain Assessment: No/denies pain    Home Living Family/patient expects to be discharged to:: Skilled nursing facility                      Prior Function Level of Independence: Needs assistance   Gait / Transfers Assistance Needed: Pt reports she was amb without assist or assistive device but question accuracy of this. Pt reports she does have rolling walker and w/c.           Hand Dominance        Extremity/Trunk Assessment   Upper Extremity Assessment: Defer to OT evaluation           Lower Extremity Assessment: Generalized weakness         Communication      Cognition Arousal/Alertness: Awake/alert Behavior During Therapy: Flat affect Overall Cognitive Status: No family/caregiver present to determine baseline cognitive functioning Area of Impairment: Orientation;Problem solving Orientation Level: Disoriented to;Time  Problem Solving: Slow processing      General Comments      Exercises        Assessment/Plan    PT Assessment Patient needs continued PT services  PT Diagnosis Difficulty walking;Abnormality of gait;Generalized weakness   PT Problem List Decreased strength;Decreased activity tolerance;Decreased balance;Decreased mobility;Decreased knowledge of use of DME;Decreased knowledge of precautions  PT Treatment Interventions DME  instruction;Gait training;Functional mobility training;Therapeutic activities;Therapeutic exercise;Patient/family education;Balance training   PT Goals (Current goals can be found in the Care Plan section) Acute Rehab PT Goals Patient Stated Goal: Return to [facility] PT Goal Formulation: With patient Time For Goal Achievement: 02/11/14 Potential to Achieve Goals: Good    Frequency Min 3X/week   Barriers to discharge        Co-evaluation               End of Session Equipment Utilized During Treatment: Gait belt Activity Tolerance: Patient tolerated treatment well Patient left: in chair;with call bell/phone within reach;with chair alarm set Nurse Communication: Mobility status (nurse tech)         Time: 6644-0347 PT Time Calculation (min): 30 min   Charges:   PT Evaluation $Initial PT Evaluation Tier I: 1 Procedure PT Treatments $Gait Training: 23-37 mins   PT G Codes:          Monica Waller 02/04/2014, 3:19 PM  [facility] PT [phone]

## 2014-01-29 MED ORDER — ACETAMINOPHEN 325 MG PO TABS: 650.0000 mg | ORAL_TABLET | Freq: Four times a day (QID) | ORAL | Status: AC | PRN

## 2014-01-29 NOTE — Consult Note (Addendum)
Reason for Consult:C1/C2 fx Referring Physician: Anitria Andon is an 80 y.o. female.  HPI: Patient is an 80 yo female with progressive supranuclear palsy who was admitted with a fall on 01/27/14. Work up included CT of Cspine which is suspicious for and impaction fracture of The right lateral mass of C1. Patient denies any neck pain. She had been neurologically stable from her current level of function. There is some question if this is rally an injury or just an incidental abnormal finding. My review of the imaging suggests that this may indeed be a fracture, however irregardless this is not unstable and not treatment needs to be considered. Collar is for comfort only  And given she does not have any pain I see no reason to offer it to the patient.  Past Medical History  Diagnosis Date  . Cerebral degeneration   . Ataxia   . Gait disorder   . Progressive supranuclear palsy   . Subdural hematoma     Hx. of a left frontal  . 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 Other generalized ischemic cerebrovascular disease 10/14/09    Noted on CT and MR brain 10/14/09  . Dysphagia, oropharyngeal phase   . Cancer     Breast  . Malignant neoplasm of breast (female), unspecified site   . Central retinal artery occlusion 12/23/2013    Left eye  . Supranuclear palsy     Past Surgical History  Procedure Laterality Date  . Mastectomy Right 1998    Dr. Rebekah Chesterfield  . Tonsillectomy    . Cataract extraction, bilateral      Family History  Problem Relation Age of Onset  . Other Mother     "Old Age"  . Cancer Father   . Cancer Brother     Colon    Social History:  reports that she quit smoking about 48 years ago. Her smoking use included Cigarettes. She smoked 0.00 packs per day. She has never used smokeless tobacco. She reports that she does not drink alcohol or use illicit drugs.  Allergies: No Known Allergies  Medications: I have reviewed the  patient's current medications.  Results for orders placed during the hospital encounter of 01/27/14 (from the past 48 hour(s))  CBC     Status: None   Collection Time    01/27/14  4:47 PM      Result Value Ref Range   WBC 8.1  4.0 - 10.5 K/uL   RBC 4.38  3.87 - 5.11 MIL/uL   Hemoglobin 13.4  12.0 - 15.0 g/dL   HCT 40.3  36.0 - 46.0 %   MCV 92.0  78.0 - 100.0 fL   MCH 30.6  26.0 - 34.0 pg   MCHC 33.3  30.0 - 36.0 g/dL   RDW 12.7  11.5 - 15.5 %   Platelets 273  150 - 400 K/uL  BASIC METABOLIC PANEL     Status: Abnormal   Collection Time    01/27/14  4:47 PM      Result Value Ref Range   Sodium 138  137 - 147 mEq/L   Potassium 4.0  3.7 - 5.3 mEq/L   Chloride 103  96 - 112 mEq/L   CO2 21  19 - 32 mEq/L   Glucose, Bld 93  70 - 99 mg/dL   BUN 26 (*) 6 - 23 mg/dL   Creatinine, Ser 0.81  0.50 -  1.10 mg/dL   Calcium 9.5  8.4 - 10.5 mg/dL   GFR calc non Af Amer 63 (*) >90 mL/min   GFR calc Af Amer 73 (*) >90 mL/min   Comment: (NOTE)     The eGFR has been calculated using the CKD EPI equation.     This calculation has not been validated in all clinical situations.     eGFR's persistently <90 mL/min signify possible Chronic Kidney     Disease.   Anion gap 14  5 - 15  CBC     Status: None   Collection Time    01/28/14  5:00 AM      Result Value Ref Range   WBC 6.6  4.0 - 10.5 K/uL   RBC 4.18  3.87 - 5.11 MIL/uL   Hemoglobin 13.3  12.0 - 15.0 g/dL   HCT 38.8  36.0 - 46.0 %   MCV 92.8  78.0 - 100.0 fL   MCH 31.8  26.0 - 34.0 pg   MCHC 34.3  30.0 - 36.0 g/dL   RDW 13.0  11.5 - 15.5 %   Platelets 241  150 - 400 K/uL  MRSA PCR SCREENING     Status: None   Collection Time    01/28/14  7:13 PM      Result Value Ref Range   MRSA by PCR NEGATIVE  NEGATIVE   Comment:            The GeneXpert MRSA Assay (FDA     approved for NASAL specimens     only), is one component of a     comprehensive MRSA colonization     surveillance program. It is not     intended to diagnose MRSA      infection nor to guide or     monitor treatment for     MRSA infections.    Dg Chest 1 View  01/27/2014   CLINICAL DATA:  Fall while being transferred from mid well chair. History of breast carcinoma  EXAM: CHEST - 1 VIEW  COMPARISON:  October 14, 2009  FINDINGS: There is a mildly displaced fracture of the right posterior seventh rib. There is a focal pneumothorax in the right apex region without tension component. There is infiltrate in the right base. Left lung is clear. Heart size and pulmonary vascularity within normal limits. Patient is status post mastectomy with surgical clips the right axillary region. There is underlying emphysema.  IMPRESSION: Displaced right posterior seventh rib fracture with right apical pneumothorax. Right base infiltrate. Underlying emphysema. Left lung clear.  Critical Value/emergent results were called by telephone at the time of interpretation on 01/27/2014 at 3:36 pm to Dr. Davonna Belling , who verbally acknowledged these results.   Electronically Signed   By: Lowella Grip M.D.   On: 01/27/2014 15:36   Dg Chest 2 View  01/28/2014   CLINICAL DATA:  Pneumothorax  EXAM: CHEST  2 VIEW  COMPARISON:  Yesterday  FINDINGS: Stable right rib fracture. No pneumothorax. Right basilar volume loss stable. Upper normal heart size.  IMPRESSION: No or pneumothorax.  Stable right basilar volume loss   Electronically Signed   By: Maryclare Bean M.D.   On: 01/28/2014 08:05   Ct Head Wo Contrast  01/27/2014   ADDENDUM REPORT: 01/27/2014 16:16  ADDENDUM: The linear area in the base of the odontoid is quite subtle and potentially could represent vascular prominence as opposed to a fracture. Clinical assessment of this area advised with respect  to this subtle finding.   Electronically Signed   By: Lowella Grip M.D.   On: 01/27/2014 16:16   01/27/2014   CLINICAL DATA:  Patient fell earlier today, currently presenting with headache, neck pain, and hematoma over left bony calvarium appears  intact. Mastoids on the right are clear. There is diffuse opacification of the mastoids on the left. There is also extensive opacity throughout the left external and Frontal region  EXAM: CT HEAD WITHOUT CONTRAST  CT CERVICAL SPINE WITHOUT CONTRAST  TECHNIQUE: Multidetector CT imaging of the head and cervical spine was performed following the standard protocol without intravenous contrast. Multiplanar CT image reconstructions of the cervical spine were also generated.  COMPARISON:  September 25, 2013  FINDINGS: CT HEAD FINDINGS  Moderate diffuse atrophy is stable. There is no mass, hemorrhage, extra-axial fluid collection, or midline shift. There is mild patchy small vessel disease in the centra semiovale bilaterally. There is no new gray-white compartment lesion. No acute infarct.  There is a scalp hematoma over the left frontal region. The bony calvarium appears intact. Mastoids on the right are clear. On the left, there is diffuse opacification of the left mastoid air cells as well as much of the left external auditory canal in essentially all left middle ear.  CT CERVICAL SPINE FINDINGS  There is an incomplete fracture along the proximal aspect of the odontoid, best seen on coronal slice 17 series 427 and sagittal slice 26 series 062. This finding is more subtle on axial slice 27 series 376. There is no other apparent fracture. There is slight anterolisthesis of C4 on C5 with slight retrolisthesis of C5 on C6 and slight anterolisthesis of C7 on T1, stable findings felt to be due to underlying spondylosis. Prevertebral soft tissues and predental space regions are normal. There is marked disc space narrowing at C4-5, C5-6, and C6-7. There is multilevel facet arthropathy, tending to be more severe overall on the right than on the left at several levels. No frank disc extrusion or stenosis is seen. There are foci of carotid artery calcification bilaterally.  There is a sizable pleural effusion on the right which appears  free-flowing.  IMPRESSION: CT head: Diffuse opacification of most of the left mastoid air cells as well as much of the left external auditory canal and essentially the entire left middle ear region. Question traumatic versus inflammatory etiology. No fracture or air-fluid level is seen in this region. Mastoids and external auditory canal/middle ear on the right are clear.  Atrophy with patchy small vessel disease, stable. No intracranial mass, hemorrhage, or extra-axial fluid. There is a left frontal scalp hematoma.  CT cervical spine: Incomplete fracture of the inferior aspect of the odontoid, type III. Alignment anatomic. No other fractures. Extensive arthropathy. Areas of spondylolisthesis appear stable under felt to be due to underlying spondylosis. There is a sizable free-flowing right pleural effusion.  Critical Value/emergent results were called by telephone at the time of interpretation on 01/27/2014 at 3:07 pm to Dr. Davonna Belling , who verbally acknowledged these results.  Electronically Signed: By: Lowella Grip M.D. On: 01/27/2014 15:07   Ct Cervical Spine Wo Contrast  01/27/2014   ADDENDUM REPORT: 01/27/2014 16:16  ADDENDUM: The linear area in the base of the odontoid is quite subtle and potentially could represent vascular prominence as opposed to a fracture. Clinical assessment of this area advised with respect to this subtle finding.   Electronically Signed   By: Lowella Grip M.D.   On: 01/27/2014 16:16  01/27/2014   CLINICAL DATA:  Patient fell earlier today, currently presenting with headache, neck pain, and hematoma over left bony calvarium appears intact. Mastoids on the right are clear. There is diffuse opacification of the mastoids on the left. There is also extensive opacity throughout the left external and Frontal region  EXAM: CT HEAD WITHOUT CONTRAST  CT CERVICAL SPINE WITHOUT CONTRAST  TECHNIQUE: Multidetector CT imaging of the head and cervical spine was performed  following the standard protocol without intravenous contrast. Multiplanar CT image reconstructions of the cervical spine were also generated.  COMPARISON:  September 25, 2013  FINDINGS: CT HEAD FINDINGS  Moderate diffuse atrophy is stable. There is no mass, hemorrhage, extra-axial fluid collection, or midline shift. There is mild patchy small vessel disease in the centra semiovale bilaterally. There is no new gray-white compartment lesion. No acute infarct.  There is a scalp hematoma over the left frontal region. The bony calvarium appears intact. Mastoids on the right are clear. On the left, there is diffuse opacification of the left mastoid air cells as well as much of the left external auditory canal in essentially all left middle ear.  CT CERVICAL SPINE FINDINGS  There is an incomplete fracture along the proximal aspect of the odontoid, best seen on coronal slice 17 series 191 and sagittal slice 26 series 478. This finding is more subtle on axial slice 27 series 295. There is no other apparent fracture. There is slight anterolisthesis of C4 on C5 with slight retrolisthesis of C5 on C6 and slight anterolisthesis of C7 on T1, stable findings felt to be due to underlying spondylosis. Prevertebral soft tissues and predental space regions are normal. There is marked disc space narrowing at C4-5, C5-6, and C6-7. There is multilevel facet arthropathy, tending to be more severe overall on the right than on the left at several levels. No frank disc extrusion or stenosis is seen. There are foci of carotid artery calcification bilaterally.  There is a sizable pleural effusion on the right which appears free-flowing.  IMPRESSION: CT head: Diffuse opacification of most of the left mastoid air cells as well as much of the left external auditory canal and essentially the entire left middle ear region. Question traumatic versus inflammatory etiology. No fracture or air-fluid level is seen in this region. Mastoids and external  auditory canal/middle ear on the right are clear.  Atrophy with patchy small vessel disease, stable. No intracranial mass, hemorrhage, or extra-axial fluid. There is a left frontal scalp hematoma.  CT cervical spine: Incomplete fracture of the inferior aspect of the odontoid, type III. Alignment anatomic. No other fractures. Extensive arthropathy. Areas of spondylolisthesis appear stable under felt to be due to underlying spondylosis. There is a sizable free-flowing right pleural effusion.  Critical Value/emergent results were called by telephone at the time of interpretation on 01/27/2014 at 3:07 pm to Dr. Davonna Belling , who verbally acknowledged these results.  Electronically Signed: By: Lowella Grip M.D. On: 01/27/2014 15:07    Review of Systems  Unable to perform ROS: age   Blood pressure 122/44, pulse 56, temperature 97.3 F (36.3 C), temperature source Oral, resp. rate 16, weight 55.974 kg (123 lb 6.4 oz), SpO2 95.00%. Physical Exam  Constitutional: She appears well-developed and well-nourished.  HENT:  Abrasions on the left side of scalp and malar region secondary to fall.  Neurological: She is alert.  Motor function appears intact in her lower extremities.    Assessment/Plan: C1 right sided impaction fracture stable. No specific  intervention required.  Frazer [place] J 01/29/2014, 11:20 AM

## 2014-01-29 NOTE — Clinical Social Work Note (Addendum)
10:30 CSW spoke with Varney Biles at [place] [phone] patient Monica Waller is pending on OT notes  CSW called OT and asked for patient to be seen as soon as possible.  10:00 CSW received call from Phillis Knack (patients POA).  CSW informed Stanton Kidney that patient can probably go this afternoon but will need insurance authorization first.  CSW spoke with Dr who stated patient can be transported by private vehicle.   CSW called Sharyn Lull with [facility] Medicare to check status of authorization- left message.  CSW will continue to follow.  Domenica Reamer, [place] Social Worker [phone]

## 2014-01-29 NOTE — Clinical Social Work Note (Signed)
Patient will discharge to [facility] Anticipated discharge date: 01/29/14 Family notified: Phillis Knack (POA) Transportation by private transport  [place] signing off.  Domenica Reamer, [place] Social Worker [phone]

## 2014-01-29 NOTE — Clinical Social Work Note (Signed)
[employer] Medicare [place] number [phone].  Csw will continue to follow.  Domenica Reamer, [place] Social Worker [phone]

## 2014-01-29 NOTE — Discharge Instructions (Signed)
CONTINUE WITH INCENTIVE SPIROMETRY 10X PER HOUR  PRN TYLENOL FOR PAIN   Pneumothorax A pneumothorax, commonly called a collapsed lung, is a condition in which air leaks from a lung and builds up in the space between the lung and the chest wall (pleural space). The air in a pneumothorax is trapped outside the lung and takes up space, preventing the lung from fully expanding. This is a condition that usually occurs suddenly. The buildup of air may be small or large. A small pneumothorax may go away on its own. When a pneumothorax is larger, it will often require medical treatment and hospitalization.  CAUSES  A pneumothorax can sometimes happen quickly with no apparent cause. People with underlying lung problems, particularly COPD or emphysema, are at higher risk of pneumothorax. However, pneumothorax can happen quickly even in people with no prior known lung problems. Trauma, surgery, medical procedures, or injury to the chest wall can also cause a pneumothorax. SIGNS AND SYMPTOMS  Sometimes a pneumothorax will have no symptoms. When symptoms are present, they can include:  Chest pain.  Shortness of breath.  Increased rate of breathing.  Bluish color to your lips or skin (cyanosis). DIAGNOSIS  Pneumothorax is usually diagnosed by a chest X-ray or chest CT scan. Your health care provider will also take a medical history and perform a physical exam to determine why you may have a pneumothorax. TREATMENT  A small pneumothorax may go away on its own without treatment. Extra oxygen can sometimes help a small pneumothorax go away more quickly. For a larger pneumothorax or a pneumothorax that is causing symptoms, a procedure is usually needed to drain the air.In some cases, the health care provider may drain the air using a needle. In other cases, a chest tube may be inserted into the pleural space. A chest tube is a small tube placed between the ribs and into the pleural space. This removes the extra  air and allows the lung to expand back to its normal size. A large pneumothorax will usually require a hospital stay. If there is ongoing air leakage into the pleural space, then the chest tube may need to remain in place for several days until the air leak has healed. In some cases, surgery may be needed.  HOME CARE INSTRUCTIONS   Only take over-the-counter or prescription medicines as directed by your health care provider.  If a cough or pain makes it difficult for you to sleep at night, try sleeping in a semi-upright position in a recliner or by using 2 or 3 pillows.  Rest and limit activity as directed by your health care provider.  If you had a chest tube and it was removed, ask your health care provider when it is okay to remove the dressing. Until your health care provider says you can remove the dressing, do not allow it to get wet.  Do not smoke. Smoking is a risk factor for pneumothorax.  Do not fly in an airplane or scuba dive until your health care provider says it is okay.  Follow up with your health care provider as directed. SEEK IMMEDIATE MEDICAL CARE IF:   You have increasing chest pain or shortness of breath.  You have a cough that is not controlled with suppressants.  You begin coughing up blood.  You have pain that is getting worse or is not controlled with medicines.  You cough up thick, discolored mucus (sputum) that is yellow to green in color.  You have redness, increasing pain,  or discharge at the site where a chest tube had been in place (if your pneumothorax was treated with a chest tube).  The site where your chest tube was located opens up.  You feel air coming out of the site where the chest tube was placed.  You have a fever or persistent symptoms for more than 2-3 days.  You have a fever and your symptoms suddenly get worse. MAKE SURE YOU:   Understand these instructions.  Will watch your condition.  Will get help right away if you are not  doing well or get worse. Document Released: 04/09/2005 Document Revised: 01/28/2013 Document Reviewed: 11/06/2012 [facility] Patient Information 2015 [place], [place]. This information is not intended to replace advice given to you by your health care provider. Make sure you discuss any questions you have with your health care provider.

## 2014-01-29 NOTE — Progress Notes (Signed)
Occupational Therapy Evaluation Patient Details Name: Monica Waller MRN: [id number] DOB: November 04, 1923 Today's Date: 01/29/2014    History of Present Illness Pt adm after fall at [facility]. Pt suffered rt rib fx and small pneumothorax. CT of cervical spine shows possible odontoid fx (further clarification pending). Pt with hx of progressive supranuclear palsy.   Clinical Impression   PTA pt lived at [place] and reports that she needed assistance for bathing and dressing (however, not verified with family or ALF). Pt currently requires max/total assist for all bathing and dressing due to decreased cognition and is incontinent of stool. Pt will benefit from SNF to address ADLs. All further OT needs will be met at next venue of care.     Follow Up Recommendations  SNF;Supervision/Assistance - 24 hour    Equipment Recommendations    None recommended   Recommendations for Other Services       Precautions / Restrictions Precautions Precautions: Fall;Cervical Precaution Comments: Trauma has consulted neurosurgery on possible odontoid fx. Pt currently not wearing C-collar Required Braces or Orthoses: Cervical Brace Cervical Brace: Hard collar Restrictions Weight Bearing Restrictions: No      Mobility Bed Mobility               General bed mobility comments: Sitting up in recliner when OT arrived.  Transfers Overall transfer level: Needs assistance Equipment used: 1 person hand held assist Transfers: Sit to/from Stand Sit to Stand: Min assist         General transfer comment: Assist for balance. VC's for hand placement and sequencing.          ADL Overall ADL's : Needs assistance/impaired Eating/Feeding: Set up;Sitting   Grooming: Minimal assistance;Sitting   Upper Body Bathing: Maximal assistance;Sitting   Lower Body Bathing: Total assistance;+2 for physical assistance;Sit to/from stand   Upper Body Dressing : Total assistance;Sitting   Lower Body  Dressing: Total assistance;+2 for physical assistance;Sit to/from stand   Toilet Transfer: Minimal assistance;+2 for safety/equipment;Stand-pivot Toilet Transfer Details (indicate cue type and reason): pt with large, scissoring-type steps Toileting- Clothing Manipulation and Hygiene: Total assistance (pt incontinent of stool)       Functional mobility during ADLs: Minimal assistance;+2 for safety/equipment General ADL Comments: Pt requires assist for ADLs due to decreased cognition.      Vision                 Additional Comments: Difficult to assess due to cognition, however pt able to look at therapist.    Perception Perception Perception Tested?: No   Praxis Praxis Praxis tested?: Not tested    Pertinent Vitals/Pain Pain Assessment: No/denies pain     Hand Dominance Left   Extremity/Trunk Assessment Upper Extremity Assessment Upper Extremity Assessment: Generalized weakness   Lower Extremity Assessment Lower Extremity Assessment: Generalized weakness   Cervical / Trunk Assessment Cervical / Trunk Assessment: Kyphotic   Communication Communication Communication: Other (comment) (slow to speak, answers briefly)   Cognition Arousal/Alertness: Awake/alert Behavior During Therapy: Flat affect (but smiled during interaction) Overall Cognitive Status: No family/caregiver present to determine baseline cognitive functioning Area of Impairment: Orientation;Attention;Memory;Following commands;Safety/judgement;Awareness;Problem solving Orientation Level: Time;Disoriented to (stated that it was "Friday") Current Attention Level: Focused Memory: Decreased short-term memory;Decreased recall of precautions Following Commands: Follows one step commands with increased time Safety/Judgement: Decreased awareness of safety;Decreased awareness of deficits Awareness: Intellectual Problem Solving: Slow processing;Decreased initiation;Difficulty sequencing;Requires verbal  cues;Requires tactile cues General Comments: Unknown PLOF/baseline  Home Living Family/patient expects to be discharged to:: Skilled nursing facility                                        Prior Functioning/Environment Level of Independence: Needs assistance  Gait / Transfers Assistance Needed: Pt reports she was amb without assist or assistive device but question accuracy of this. Pt reports she does have rolling walker and w/c. ADL's / Homemaking Assistance Needed: Pt reports that she had assistance for bathing and dressing. Pt had difficulty answering questions regarding her bath and whether it was shower vs. sponge bath (just stated "I needed help.")        OT Diagnosis: Generalized weakness;Cognitive deficits;Acute pain                          End of Session Equipment Utilized During Treatment: Gait belt  Activity Tolerance: Patient tolerated treatment well Patient left: in chair;with call bell/phone within reach;with chair alarm set   Time: 1057-1107 OT Time Calculation (min): 10 min Charges:  OT General Charges $OT Visit: 1 Procedure OT Evaluation $Initial OT Evaluation Tier I: 1 Procedure  [name] 01/29/2014, 11:29 AM  Cyndie Chime, OTR/L Occupational Therapist [phone] (pager)

## 2014-01-29 NOTE — Progress Notes (Signed)
Report called to [facility], Nurse at [place]. Pt is ready for discharge and family member is here to take pt over to the facility. Pt vitals are stable at this time; pt denies any complaints or concerns at this time.

## 2014-01-29 NOTE — Progress Notes (Signed)
Patient ID: Monica Waller, female   DOB: 1923-10-15, 80 y.o.   MRN: [id number]  LOS: 2 days   Subjective: No changes.  No sob, cp.    Objective: Vital signs in last 24 hours: Temp:  [97.3 F (36.3 C)-98.1 F (36.7 C)] 97.3 F (36.3 C) (10/09 0556) Pulse Rate:  [56-72] 56 (10/09 0556) Resp:  [16] 16 (10/09 0556) BP: (120-124)/(44-60) 122/44 mmHg (10/09 0556) SpO2:  [95 %-98 %] 95 % (10/09 0556) Last BM Date: 01/28/14  Lab Results:  CBC  Recent Labs  01/27/14 1647 01/28/14 0500  WBC 8.1 6.6  HGB 13.4 13.3  HCT 40.3 38.8  PLT 273 241   BMET  Recent Labs  01/27/14 1647  NA 138  K 4.0  CL 103  CO2 21  GLUCOSE 93  BUN 26*  CREATININE 0.81  CALCIUM 9.5    Imaging: Dg Chest 1 View  01/27/2014   CLINICAL DATA:  Fall while being transferred from [facility]. History of breast carcinoma  EXAM: CHEST - 1 VIEW  COMPARISON:  October 14, 2009  FINDINGS: There is a mildly displaced fracture of the right posterior seventh rib. There is a focal pneumothorax in the right apex region without tension component. There is infiltrate in the right base. Left lung is clear. Heart size and pulmonary vascularity within normal limits. Patient is status post mastectomy with surgical clips the right axillary region. There is underlying emphysema.  IMPRESSION: Displaced right posterior seventh rib fracture with right apical pneumothorax. Right base infiltrate. Underlying emphysema. Left lung clear.  Critical Value/emergent results were called by telephone at the time of interpretation on 01/27/2014 at 3:36 pm to Dr. Davonna Belling , who verbally acknowledged these results.   Electronically Signed   By: Lowella Grip M.D.   On: 01/27/2014 15:36   Dg Chest 2 View  01/28/2014   CLINICAL DATA:  Pneumothorax  EXAM: CHEST  2 VIEW  COMPARISON:  Yesterday  FINDINGS: Stable right rib fracture. No pneumothorax. Right basilar volume loss stable. Upper normal heart size.  IMPRESSION: No or pneumothorax.   Stable right basilar volume loss   Electronically Signed   By: Maryclare Bean M.D.   On: 01/28/2014 08:05   Ct Head Wo Contrast  01/27/2014   ADDENDUM REPORT: 01/27/2014 16:16  ADDENDUM: The linear area in the base of the odontoid is quite subtle and potentially could represent vascular prominence as opposed to a fracture. Clinical assessment of this area advised with respect to this subtle finding.   Electronically Signed   By: Lowella Grip M.D.   On: 01/27/2014 16:16   01/27/2014   CLINICAL DATA:  Patient fell earlier today, currently presenting with headache, neck pain, and hematoma over left bony calvarium appears intact. Mastoids on the right are clear. There is diffuse opacification of the mastoids on the left. There is also extensive opacity throughout the left external and Frontal region  EXAM: CT HEAD WITHOUT CONTRAST  CT CERVICAL SPINE WITHOUT CONTRAST  TECHNIQUE: Multidetector CT imaging of the head and cervical spine was performed following the standard protocol without intravenous contrast. Multiplanar CT image reconstructions of the cervical spine were also generated.  COMPARISON:  September 25, 2013  FINDINGS: CT HEAD FINDINGS  Moderate diffuse atrophy is stable. There is no mass, hemorrhage, extra-axial fluid collection, or midline shift. There is mild patchy small vessel disease in the centra semiovale bilaterally. There is no new gray-white compartment lesion. No acute infarct.  There is a  scalp hematoma over the left frontal region. The bony calvarium appears intact. Mastoids on the right are clear. On the left, there is diffuse opacification of the left mastoid air cells as well as much of the left external auditory canal in essentially all left middle ear.  CT CERVICAL SPINE FINDINGS  There is an incomplete fracture along the proximal aspect of the odontoid, best seen on coronal slice 17 series 063 and sagittal slice 26 series 016. This finding is more subtle on axial slice 27 series 010. There is  no other apparent fracture. There is slight anterolisthesis of C4 on C5 with slight retrolisthesis of C5 on C6 and slight anterolisthesis of C7 on T1, stable findings felt to be due to underlying spondylosis. Prevertebral soft tissues and predental space regions are normal. There is marked disc space narrowing at C4-5, C5-6, and C6-7. There is multilevel facet arthropathy, tending to be more severe overall on the right than on the left at several levels. No frank disc extrusion or stenosis is seen. There are foci of carotid artery calcification bilaterally.  There is a sizable pleural effusion on the right which appears free-flowing.  IMPRESSION: CT head: Diffuse opacification of most of the left mastoid air cells as well as much of the left external auditory canal and essentially the entire left middle ear region. Question traumatic versus inflammatory etiology. No fracture or air-fluid level is seen in this region. Mastoids and external auditory canal/middle ear on the right are clear.  Atrophy with patchy small vessel disease, stable. No intracranial mass, hemorrhage, or extra-axial fluid. There is a left frontal scalp hematoma.  CT cervical spine: Incomplete fracture of the inferior aspect of the odontoid, type III. Alignment anatomic. No other fractures. Extensive arthropathy. Areas of spondylolisthesis appear stable under felt to be due to underlying spondylosis. There is a sizable free-flowing right pleural effusion.  Critical Value/emergent results were called by telephone at the time of interpretation on 01/27/2014 at 3:07 pm to Dr. Davonna Belling , who verbally acknowledged these results.  Electronically Signed: By: Lowella Grip M.D. On: 01/27/2014 15:07   Ct Cervical Spine Wo Contrast  01/27/2014   ADDENDUM REPORT: 01/27/2014 16:16  ADDENDUM: The linear area in the base of the odontoid is quite subtle and potentially could represent vascular prominence as opposed to a fracture. Clinical  assessment of this area advised with respect to this subtle finding.   Electronically Signed   By: Lowella Grip M.D.   On: 01/27/2014 16:16   01/27/2014   CLINICAL DATA:  Patient fell earlier today, currently presenting with headache, neck pain, and hematoma over left bony calvarium appears intact. Mastoids on the right are clear. There is diffuse opacification of the mastoids on the left. There is also extensive opacity throughout the left external and Frontal region  EXAM: CT HEAD WITHOUT CONTRAST  CT CERVICAL SPINE WITHOUT CONTRAST  TECHNIQUE: Multidetector CT imaging of the head and cervical spine was performed following the standard protocol without intravenous contrast. Multiplanar CT image reconstructions of the cervical spine were also generated.  COMPARISON:  September 25, 2013  FINDINGS: CT HEAD FINDINGS  Moderate diffuse atrophy is stable. There is no mass, hemorrhage, extra-axial fluid collection, or midline shift. There is mild patchy small vessel disease in the centra semiovale bilaterally. There is no new gray-white compartment lesion. No acute infarct.  There is a scalp hematoma over the left frontal region. The bony calvarium appears intact. Mastoids on the right are clear. On the  left, there is diffuse opacification of the left mastoid air cells as well as much of the left external auditory canal in essentially all left middle ear.  CT CERVICAL SPINE FINDINGS  There is an incomplete fracture along the proximal aspect of the odontoid, best seen on coronal slice 17 series 732 and sagittal slice 26 series 202. This finding is more subtle on axial slice 27 series 542. There is no other apparent fracture. There is slight anterolisthesis of C4 on C5 with slight retrolisthesis of C5 on C6 and slight anterolisthesis of C7 on T1, stable findings felt to be due to underlying spondylosis. Prevertebral soft tissues and predental space regions are normal. There is marked disc space narrowing at C4-5, C5-6, and  C6-7. There is multilevel facet arthropathy, tending to be more severe overall on the right than on the left at several levels. No frank disc extrusion or stenosis is seen. There are foci of carotid artery calcification bilaterally.  There is a sizable pleural effusion on the right which appears free-flowing.  IMPRESSION: CT head: Diffuse opacification of most of the left mastoid air cells as well as much of the left external auditory canal and essentially the entire left middle ear region. Question traumatic versus inflammatory etiology. No fracture or air-fluid level is seen in this region. Mastoids and external auditory canal/middle ear on the right are clear.  Atrophy with patchy small vessel disease, stable. No intracranial mass, hemorrhage, or extra-axial fluid. There is a left frontal scalp hematoma.  CT cervical spine: Incomplete fracture of the inferior aspect of the odontoid, type III. Alignment anatomic. No other fractures. Extensive arthropathy. Areas of spondylolisthesis appear stable under felt to be due to underlying spondylosis. There is a sizable free-flowing right pleural effusion.  Critical Value/emergent results were called by telephone at the time of interpretation on 01/27/2014 at 3:07 pm to Dr. Davonna Belling , who verbally acknowledged these results.  Electronically Signed: By: Lowella Grip M.D. On: 01/27/2014 15:07    PE:  General appearance: alert, cooperative and no distress  Resp: clear to auscultation bilaterally  Cardio: regular rate and rhythm, S1, S2 normal, no murmur, click, rub or gallop  GI: soft, non-tender; bowel sounds normal; no masses, no organomegaly  Extremities: extremities normal, atraumatic, no cyanosis or edema   Patient Active Problem List   Diagnosis Date Noted  . Pneumothorax 01/27/2014  . Central retinal artery occlusion, left eye 12/25/2013  . Central retinal artery occlusion 12/23/2013  . Blurred vision 12/15/2013  . Laceration of left  eyebrow without complication 70/62/3762  . Blepharitis 08/18/2013  . Dysphagia, oropharyngeal phase   . Malignant neoplasm of breast (female), unspecified site   . Gait disorder   . UTI (lower urinary tract infection) 01/18/2013  . Hematoma 01/18/2013  . Fall 01/18/2013  . Anemia 06/04/2012  . Abnormality of gait 06/04/2012  . Progressive supranuclear palsy 06/04/2012  . Other general symptoms 06/04/2012  . Other generalized ischemic cerebrovascular disease 10/14/2009  . 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 Ellene Route to evaluate.   Right rib fracture/PTX-repeat CXR did not reveal a PTX.  Needs IS teaching, will order again.  Left scalp hematoma/abrasion-local care  Progressive supranuclear palsy/ataxia/gait disturbance-WC bound, frequent falls. This is her baseline.  VTE - SCD's, heparin  FEN - D3 diet  Dispo -- needs OT eval and [facility]  [facility] to [facility]F.     Shanira Tine, ANP-BC  Pager: [place] Pager: [phone]   01/29/2014 8:55 AM

## 2014-01-29 NOTE — Discharge Summary (Signed)
Physician Discharge Summary  Monica Waller ZDG:[id number] DOB: [birth year] DOA: 01/27/2014  PCP: Estill Dooms, MD  Consultation: [facility]--Dr. Ellene Route  Admit date: 01/27/2014 Discharge date: 01/29/2014  Recommendations for Outpatient Follow-up:    Follow-up Information   Follow up with [facility]. (As needed, If symptoms worsen)    Contact information:   [place] [address] [phone]      Discharge Diagnoses:  1. GLF 2. Right rib fracture 3. Pneumothorax 4. C1 right sided impaction fracture   Surgical Procedure: none  Discharge Condition: stable Disposition: SNF  Diet recommendation: regular  Filed Weights   01/27/14 2107  Weight: 123 lb 6.4 oz (55.974 kg)    Filed Vitals:   01/29/14 0556  BP: 122/44  Pulse: 56  Temp: 97.3 F (36.3 C)  Resp: 16      Hospital Course:  Monica Waller presented to [facility] following a GLF at ALF.  She was transferred to [facility] and admitted to trauma services.  She was found to have a right sided rib fracture with a small PTX.  A follow up chest x ray showed a resolved PTX.  She remained stable.  She did not require pain medication.  She was mobilized with therapies who recommended SNF.  [facility] was also consulted for a questionable odontoid fracture, after review Dr. Ellene Route felt this was a stable C1 right sided impaction fracture which does not require any specific instructions.  On HD#2 the patient was felt stable to return to SNF.  She may follow up with trauma on PRN basis.     Discharge Instructions     Medication List         acetaminophen 325 MG tablet  Commonly known as:  TYLENOL  Take 2 tablets (650 mg total) by mouth every 6 (six) hours as needed.     ARTIFICIAL TEARS 1.4 % ophthalmic solution  Generic drug:  polyvinyl alcohol  1 drop as needed for dry eyes. *wait 2-5 minutes between eye drops , twice daily     aspirin 81 MG tablet  Take 81 mg by mouth daily.     CALTRATE 600+D 600-800  MG-UNIT Tabs  Generic drug:  Calcium Carb-Cholecalciferol  Take 1 tablet by mouth.     multivitamin-iron-minerals-folic acid chewable tablet  Chew 1 tablet by mouth daily.           Follow-up Information   Follow up with [facility]. (As needed, If symptoms worsen)    Contact information:   [place] [address] [phone]        The results of significant diagnostics from this hospitalization (including imaging, microbiology, ancillary and laboratory) are listed below for reference.    Significant Diagnostic Studies: Dg Chest 1 View  01/27/2014   CLINICAL DATA:  Fall while being transferred from [facility]. History of breast carcinoma  EXAM: CHEST - 1 VIEW  COMPARISON:  October 14, 2009  FINDINGS: There is a mildly displaced fracture of the right posterior seventh rib. There is a focal pneumothorax in the right apex region without tension component. There is infiltrate in the right base. Left lung is clear. Heart size and pulmonary vascularity within normal limits. Patient is status post mastectomy with surgical clips the right axillary region. There is underlying emphysema.  IMPRESSION: Displaced right posterior seventh rib fracture with right apical pneumothorax. Right base infiltrate. Underlying emphysema. Left lung clear.  Critical Value/emergent results  were called by telephone at the time of interpretation on 01/27/2014 at 3:36 pm to Dr. Davonna Belling , who verbally acknowledged these results.   Electronically Signed   By: Lowella Grip M.D.   On: 01/27/2014 15:36   Dg Chest 2 View  01/28/2014   CLINICAL DATA:  Pneumothorax  EXAM: CHEST  2 VIEW  COMPARISON:  Yesterday  FINDINGS: Stable right rib fracture. No pneumothorax. Right basilar volume loss stable. Upper normal heart size.  IMPRESSION: No or pneumothorax.  Stable right basilar volume loss   Electronically Signed   By: Maryclare Bean M.D.   On: 01/28/2014 08:05   Ct Head Wo  Contrast  01/27/2014   ADDENDUM REPORT: 01/27/2014 16:16  ADDENDUM: The linear area in the base of the odontoid is quite subtle and potentially could represent vascular prominence as opposed to a fracture. Clinical assessment of this area advised with respect to this subtle finding.   Electronically Signed   By: Lowella Grip M.D.   On: 01/27/2014 16:16   01/27/2014   CLINICAL DATA:  Patient fell earlier today, currently presenting with headache, neck pain, and hematoma over left bony calvarium appears intact. Mastoids on the right are clear. There is diffuse opacification of the mastoids on the left. There is also extensive opacity throughout the left external and Frontal region  EXAM: CT HEAD WITHOUT CONTRAST  CT CERVICAL SPINE WITHOUT CONTRAST  TECHNIQUE: Multidetector CT imaging of the head and cervical spine was performed following the standard protocol without intravenous contrast. Multiplanar CT image reconstructions of the cervical spine were also generated.  COMPARISON:  September 25, 2013  FINDINGS: CT HEAD FINDINGS  Moderate diffuse atrophy is stable. There is no mass, hemorrhage, extra-axial fluid collection, or midline shift. There is mild patchy small vessel disease in the centra semiovale bilaterally. There is no new gray-white compartment lesion. No acute infarct.  There is a scalp hematoma over the left frontal region. The bony calvarium appears intact. Mastoids on the right are clear. On the left, there is diffuse opacification of the left mastoid air cells as well as much of the left external auditory canal in essentially all left middle ear.  CT CERVICAL SPINE FINDINGS  There is an incomplete fracture along the proximal aspect of the odontoid, best seen on coronal slice 17 series 952 and sagittal slice 26 series 841. This finding is more subtle on axial slice 27 series 324. There is no other apparent fracture. There is slight anterolisthesis of C4 on C5 with slight retrolisthesis of C5 on C6 and  slight anterolisthesis of C7 on T1, stable findings felt to be due to underlying spondylosis. Prevertebral soft tissues and predental space regions are normal. There is marked disc space narrowing at C4-5, C5-6, and C6-7. There is multilevel facet arthropathy, tending to be more severe overall on the right than on the left at several levels. No frank disc extrusion or stenosis is seen. There are foci of carotid artery calcification bilaterally.  There is a sizable pleural effusion on the right which appears free-flowing.  IMPRESSION: CT head: Diffuse opacification of most of the left mastoid air cells as well as much of the left external auditory canal and essentially the entire left middle ear region. Question traumatic versus inflammatory etiology. No fracture or air-fluid level is seen in this region. Mastoids and external auditory canal/middle ear on the right are clear.  Atrophy with patchy small vessel disease, stable. No intracranial mass, hemorrhage, or extra-axial fluid. There is  a left frontal scalp hematoma.  CT cervical spine: Incomplete fracture of the inferior aspect of the odontoid, type III. Alignment anatomic. No other fractures. Extensive arthropathy. Areas of spondylolisthesis appear stable under felt to be due to underlying spondylosis. There is a sizable free-flowing right pleural effusion.  Critical Value/emergent results were called by telephone at the time of interpretation on 01/27/2014 at 3:07 pm to Dr. Davonna Belling , who verbally acknowledged these results.  Electronically Signed: By: Lowella Grip M.D. On: 01/27/2014 15:07   Ct Cervical Spine Wo Contrast  01/27/2014   ADDENDUM REPORT: 01/27/2014 16:16  ADDENDUM: The linear area in the base of the odontoid is quite subtle and potentially could represent vascular prominence as opposed to a fracture. Clinical assessment of this area advised with respect to this subtle finding.   Electronically Signed   By: Lowella Grip M.D.    On: 01/27/2014 16:16   01/27/2014   CLINICAL DATA:  Patient fell earlier today, currently presenting with headache, neck pain, and hematoma over left bony calvarium appears intact. Mastoids on the right are clear. There is diffuse opacification of the mastoids on the left. There is also extensive opacity throughout the left external and Frontal region  EXAM: CT HEAD WITHOUT CONTRAST  CT CERVICAL SPINE WITHOUT CONTRAST  TECHNIQUE: Multidetector CT imaging of the head and cervical spine was performed following the standard protocol without intravenous contrast. Multiplanar CT image reconstructions of the cervical spine were also generated.  COMPARISON:  September 25, 2013  FINDINGS: CT HEAD FINDINGS  Moderate diffuse atrophy is stable. There is no mass, hemorrhage, extra-axial fluid collection, or midline shift. There is mild patchy small vessel disease in the centra semiovale bilaterally. There is no new gray-white compartment lesion. No acute infarct.  There is a scalp hematoma over the left frontal region. The bony calvarium appears intact. Mastoids on the right are clear. On the left, there is diffuse opacification of the left mastoid air cells as well as much of the left external auditory canal in essentially all left middle ear.  CT CERVICAL SPINE FINDINGS  There is an incomplete fracture along the proximal aspect of the odontoid, best seen on coronal slice 17 series 932 and sagittal slice 26 series 355. This finding is more subtle on axial slice 27 series 732. There is no other apparent fracture. There is slight anterolisthesis of C4 on C5 with slight retrolisthesis of C5 on C6 and slight anterolisthesis of C7 on T1, stable findings felt to be due to underlying spondylosis. Prevertebral soft tissues and predental space regions are normal. There is marked disc space narrowing at C4-5, C5-6, and C6-7. There is multilevel facet arthropathy, tending to be more severe overall on the right than on the left at several  levels. No frank disc extrusion or stenosis is seen. There are foci of carotid artery calcification bilaterally.  There is a sizable pleural effusion on the right which appears free-flowing.  IMPRESSION: CT head: Diffuse opacification of most of the left mastoid air cells as well as much of the left external auditory canal and essentially the entire left middle ear region. Question traumatic versus inflammatory etiology. No fracture or air-fluid level is seen in this region. Mastoids and external auditory canal/middle ear on the right are clear.  Atrophy with patchy small vessel disease, stable. No intracranial mass, hemorrhage, or extra-axial fluid. There is a left frontal scalp hematoma.  CT cervical spine: Incomplete fracture of the inferior aspect of the odontoid, type III.  Alignment anatomic. No other fractures. Extensive arthropathy. Areas of spondylolisthesis appear stable under felt to be due to underlying spondylosis. There is a sizable free-flowing right pleural effusion.  Critical Value/emergent results were called by telephone at the time of interpretation on 01/27/2014 at 3:07 pm to Dr. Davonna Belling , who verbally acknowledged these results.  Electronically Signed: By: Lowella Grip M.D. On: 01/27/2014 15:07   Mr Brain Wo Contrast  01/10/2014     [facility] [address], [place], [place] [address] [phone]  NEUROIMAGING REPORT   STUDY DATE: 01/08/2014 PATIENT NAME: WILMOTH RASNIC DOB: 1924/02/26 MRN: [id number]  ORDERING CLINICIAN: Dr Jannifer Franklin CLINICAL HISTORY: 60 year patient with progressive supranuclear palsy and  vision loss COMPARISON FILMS: MRI brain wo 2011 EXAM: MRI Brain wo TECHNIQUE: MRI of the brain without contrast was obtained utilizing 5 mm  axial slices with T1, T2, T2 flair, T2 star gradient echo and diffusion  weighted views.  T1 sagittal and T2 coronal views were obtained. CONTRAST: none IMAGING SITE: [facility]  FINDINGS:  The brain  parenchyma shows moderate generalized cerebral atrophy and mild  changes of chronic microvascular ischemia. No structural lesion, tumor or  infarction noted. The paranasal sinuses showed mild chronic inflammatory  changes including bilateral and mild mastoiditis as well.No abnormal  lesions are seen on diffusion-weighted views to suggest acute ischemia.  The cortical sulci, fissures and cisterns are normal in size and  appearance. Lateral, third and fourth ventricle are normal in size and  appearance. No extra-axial fluid collections are seen. No evidence of mass  effect or midline shift.  On sagittal views the posterior fossa, pituitary  gland and corpus callosum are unremarkable. No evidence of intracranial  hemorrhage on gradient-echo views. The orbits and their contents,  paranasal sinuses and calvarium are unremarkable.  Intracranial flow voids  are present.      01/10/2014    Abnormal MRI scan of the brain showing moderate changes of  generalized cerebral atrophy and mild changes of chronic microvascular  ischemia . No significant change compared with previous MRI scan from 2011   INTERPRETING PHYSICIAN:  PRAMOD SETHI, MD Certified in  [place] by [employer] and [facility]     Microbiology: Recent Results (from the past 240 hour(s))  MRSA PCR SCREENING     Status: None   Collection Time    01/28/14  7:13 PM      Result Value Ref Range Status   MRSA by PCR NEGATIVE  NEGATIVE Final   Comment:            The GeneXpert MRSA Assay (FDA     approved for NASAL specimens     only), is one component of a     comprehensive MRSA colonization     surveillance program. It is not     intended to diagnose MRSA     infection nor to guide or     monitor treatment for     MRSA infections.     Labs: Basic Metabolic Panel:  Recent Labs Lab 01/27/14 1647  NA 138  K 4.0  CL 103  CO2 21  GLUCOSE 93  BUN 26*  CREATININE 0.81  CALCIUM  9.5   Liver Function Tests: No results found for this basename: AST, ALT, ALKPHOS, BILITOT, PROT, ALBUMIN,  in the last 168 hours No results found for this basename: LIPASE, AMYLASE,  in the last 168 hours No results  found for this basename: AMMONIA,  in the last 168 hours CBC:  Recent Labs Lab 01/27/14 1647 01/28/14 0500  WBC 8.1 6.6  HGB 13.4 13.3  HCT 40.3 38.8  MCV 92.0 92.8  PLT 273 241   Cardiac Enzymes: No results found for this basename: CKTOTAL, CKMB, CKMBINDEX, TROPONINI,  in the last 168 hours BNP: BNP (last 3 results) No results found for this basename: PROBNP,  in the last 8760 hours CBG: No results found for this basename: GLUCAP,  in the last 168 hours  Active Problems:   Pneumothorax   Time coordinating discharge: <30 mins  Signed:  Sharlena Kristensen, ANP-BC

## 2014-01-29 NOTE — Progress Notes (Signed)
Patient doing okay.  Stable.  Can be discharged once there is clarification of odontoid finding on scans.  Patient is currently without cervical stabilization and doing just fine without pain.  No neck pain.  This patient has been seen and I agree with the findings and treatment plan.  Kathryne Eriksson. Dahlia Bailiff, MD, [place] [phone] (pager) [phone] (direct pager) Trauma Surgeon

## 2014-02-01 NOTE — Clinical Social Work Note (Signed)
CSW received call from [facility].  PTAR authorization number is [id number]  Domenica Reamer, [place] Social Worker [phone]

## 2014-02-02 ENCOUNTER — Encounter: Payer: Self-pay | Admitting: Nurse Practitioner

## 2014-02-02 ENCOUNTER — Non-Acute Institutional Stay (SKILLED_NURSING_FACILITY): Payer: Medicare Other | Admitting: Nurse Practitioner

## 2014-02-02 DIAGNOSIS — J939 Pneumothorax, unspecified: Secondary | ICD-10-CM

## 2014-02-02 DIAGNOSIS — Z66 Do not resuscitate: Secondary | ICD-10-CM | POA: Insufficient documentation

## 2014-02-02 DIAGNOSIS — S12000D Unspecified displaced fracture of first cervical vertebra, subsequent encounter for fracture with routine healing: Secondary | ICD-10-CM

## 2014-02-02 DIAGNOSIS — S2231XS Fracture of one rib, right side, sequela: Secondary | ICD-10-CM

## 2014-02-02 DIAGNOSIS — G231 Progressive supranuclear ophthalmoplegia [Steele-Richardson-Olszewski]: Secondary | ICD-10-CM

## 2014-02-04 ENCOUNTER — Encounter: Payer: Self-pay | Admitting: Nurse Practitioner

## 2014-02-04 DIAGNOSIS — S12000A Unspecified displaced fracture of first cervical vertebra, initial encounter for closed fracture: Secondary | ICD-10-CM | POA: Insufficient documentation

## 2014-02-04 DIAGNOSIS — S2231XA Fracture of one rib, right side, initial encounter for closed fracture: Secondary | ICD-10-CM | POA: Insufficient documentation

## 2014-02-04 NOTE — Assessment & Plan Note (Signed)
01/27/14-01/30/14 hospitalized NSU was also consulted for a questionable odontoid fracture, after review Dr. Ellene Route felt this was a stable C1 right sided impaction fracture which does not require any specific instruction

## 2014-02-04 NOTE — Assessment & Plan Note (Signed)
Frequent falling.

## 2014-02-04 NOTE — Assessment & Plan Note (Signed)
01/27/14 CXR IMPRESSION:  Displaced right posterior seventh rib fracture with right apical  pneumothorax. Right base infiltrate. Underlying emphysema. Left lung  clear.

## 2014-02-04 NOTE — Assessment & Plan Note (Signed)
She was found to have a right sided rib fracture with a small PTX. A follow up chest x ray showed a resolved PTX.

## 2014-02-04 NOTE — Progress Notes (Signed)
Patient ID: Monica Waller, female   DOB: November 24, 1923, 80 y.o.   MRN: [id number]    Code Status: DNR  No Known Allergies  Chief Complaint  Patient presents with  . Medical Management of Chronic Issues  . Acute Visit    facial contusion.    HPI: Patient is a 80 y.o. female seen in the SNF at [facility] today for evaluation of s/p fall, facial contusion, hospitalization for C1 fx, Pneumothorax, R rib fx, and chronic medical conditions.    01/27/2014-01/29/2014 Right rib fracture Pneumothorax C1 right sided impaction fracture. She was found to have a right sided rib fracture with a small PTX. A follow up chest x ray showed a resolved PTX. NSU was also consulted for a questionable odontoid fracture, after review Dr. Ellene Route felt this was a stable C1 right sided impaction fracture which does not require any specific instruction   6/5/15ED evaluation for left forehead/left popliteal laceration-healed.    Problem List Items Addressed This Visit   Right rib fracture     01/27/14 CXR IMPRESSION:  Displaced right posterior seventh rib fracture with right apical  pneumothorax. Right base infiltrate. Underlying emphysema. Left lung  clear.     Progressive supranuclear palsy     Frequent falling.      Pneumothorax     She was found to have a right sided rib fracture with a small PTX. A follow up chest x ray showed a resolved PTX.    C1 cervical fracture - Primary     01/27/14-01/30/14 hospitalized NSU was also consulted for a questionable odontoid fracture, after review Dr. Ellene Route felt this was a stable C1 right sided impaction fracture which does not require any specific instruction        Review of Systems:  Review of Systems  Constitutional: Negative for fever, chills, weight loss, malaise/fatigue and diaphoresis.  HENT: Positive for hearing loss. Negative for congestion, ear discharge, ear pain, nosebleeds, sore throat and tinnitus.   Eyes: Positive for double vision (?).  Negative for blurred vision, photophobia, pain, discharge and redness.       ? supranuclear ophthalmoplegia  Respiratory: Negative for cough, hemoptysis, sputum production, shortness of breath, wheezing and stridor.   Cardiovascular: Negative for chest pain, palpitations, orthopnea, claudication, leg swelling and PND.  Gastrointestinal: Negative for heartburn, nausea, vomiting, abdominal pain, diarrhea, constipation, blood in stool and melena.  Genitourinary: Positive for frequency. Negative for dysuria, urgency, hematuria and flank pain.  Musculoskeletal: Positive for falls. Negative for back pain, joint pain, myalgias and neck pain.       Abnormal gait.   Skin: Negative for itching and rash.       Left facial ecchymoses.    Neurological: Negative for dizziness, tingling, tremors, sensory change, speech change, focal weakness, seizures, loss of consciousness, weakness and headaches.  Endo/Heme/Allergies: Negative for environmental allergies and polydipsia. Does not bruise/bleed easily.  Psychiatric/Behavioral: Positive for memory loss. Negative for depression, suicidal ideas, hallucinations and substance abuse. The patient is not nervous/anxious and does not have insomnia.      Past Medical History  Diagnosis Date  . Cerebral degeneration   . Ataxia   . Gait disorder   . Progressive supranuclear palsy   . Subdural hematoma     Hx. of a left frontal  . 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 Other generalized ischemic cerebrovascular disease 10/14/09    Noted on CT and  MR brain 10/14/09  . Dysphagia, oropharyngeal phase   . Cancer     Breast  . Malignant neoplasm of breast (female), unspecified site   . Central retinal artery occlusion 12/23/2013    Left eye  . Supranuclear palsy    Past Surgical History  Procedure Laterality Date  . Mastectomy Right 1998    Dr. Rebekah Chesterfield  . Tonsillectomy    . Cataract extraction, bilateral     Social  History:   reports that she quit smoking about 48 years ago. Her smoking use included Cigarettes. She smoked 0.00 packs per day. She has never used smokeless tobacco. She reports that she does not drink alcohol or use illicit drugs.  Family History  Problem Relation Age of Onset  . Other Mother     "Old Age"  . Cancer Father   . Cancer Brother     Colon    Medications: Patient's Medications  New Prescriptions   No medications on file  Previous Medications   ACETAMINOPHEN (TYLENOL) 325 MG TABLET    Take 2 tablets (650 mg total) by mouth every 6 (six) hours as needed.   ASPIRIN 81 MG TABLET    Take 81 mg by mouth daily.   CALCIUM CARB-CHOLECALCIFEROL (CALTRATE 600+D) 600-800 MG-UNIT TABS    Take 1 tablet by mouth.   MULTIVITAMIN-IRON-MINERALS-FOLIC ACID (CENTRUM) CHEWABLE TABLET    Chew 1 tablet by mouth daily.   POLYVINYL ALCOHOL (ARTIFICIAL TEARS) 1.4 % OPHTHALMIC SOLUTION    1 drop as needed for dry eyes. *wait 2-5 minutes between eye drops , twice daily  Modified Medications   No medications on file  Discontinued Medications   No medications on file     Physical Exam: Physical Exam  Constitutional: She is oriented to person, place, and time. She appears well-developed and well-nourished. No distress.  HENT:  Head: Normocephalic.  Right Ear: External ear normal.  Left Ear: External ear normal.  Nose: Nose normal.  Mouth/Throat: Oropharynx is clear and moist. No oropharyngeal exudate.  Eyes: Conjunctivae are normal. Right eye exhibits no discharge. Left eye exhibits no discharge. No scleral icterus.  Difficulty looking down followed by an upgaze ?palsy  Neck: Normal range of motion. Neck supple. No JVD present. No tracheal deviation present. No thyromegaly present.  Cardiovascular: Normal rate, regular rhythm, normal heart sounds and intact distal pulses.   No murmur heard. Pulmonary/Chest: Effort normal and breath sounds normal. No stridor. No respiratory distress. She has  no wheezes. She has no rales. She exhibits no tenderness.  Abdominal: Soft. Bowel sounds are normal. She exhibits no distension. There is no tenderness. There is no rebound and no guarding.  Musculoskeletal: Normal range of motion. She exhibits no edema and no tenderness.  Lymphadenopathy:    She has no cervical adenopathy.  Neurological: She is alert and oriented to person, place, and time. She displays normal reflexes. A cranial nerve deficit is present. She exhibits normal muscle tone. Coordination abnormal.  Skin: Skin is warm and dry. No rash noted. She is not diaphoretic. No erythema. No pallor.  Left facial ecchymoses.     Psychiatric: She has a normal mood and affect. Her speech is normal and behavior is normal. Judgment and thought content normal. Cognition and memory are impaired. She does not express impulsivity or inappropriate judgment. She exhibits abnormal recent memory. She exhibits normal remote memory.    Filed Vitals:   02/02/14 1716  BP: 127/61  Pulse: 65  Temp: 97.9 F (36.6 C)  TempSrc: Tympanic  Resp: 18    Labs reviewed: Basic Metabolic Panel:  Recent Labs  01/27/14 1647  NA 138  K 4.0  CL 103  CO2 21  GLUCOSE 93  BUN 26*  CREATININE 0.81  CALCIUM 9.5   CBC:  Recent Labs  01/27/14 1647 01/28/14 0500  WBC 8.1 6.6  HGB 13.4 13.3  HCT 40.3 38.8  MCV 92.0 92.8  PLT 273 241   Past Procedures:  01/16/13 CT head Wo Contrast:   IMPRESSION:  Atrophy and chronic microvascular ischemia. No acute abnormality   09/25/13 CT head, cervical spine, maxillary bones:   IMPRESSION:  No acute intracranial abnormality. Chronic and involutional changes  appreciated.  Left periorbital hematoma. No evidence of acute fracture nor  dislocation. Findings which may represent sinus disease within the  ethmoid air cells.  Multilevel spondylosis within the cervical spine without acute  osseous abnormalities.  01/27/14 CXR  IMPRESSION:  Displaced right  posterior seventh rib fracture with right apical  pneumothorax. Right base infiltrate. Underlying emphysema. Left lung  clear.  01/27/14 CT head and cervical spine:  IMPRESSION:  CT head: Diffuse opacification of most of the left mastoid air cells  as well as much of the left external auditory canal and essentially  the entire left middle ear region. Question traumatic versus  inflammatory etiology. No fracture or air-fluid level is seen in  this region. Mastoids and external auditory canal/middle ear on the  right are clear.  Atrophy with patchy small vessel disease, stable. No intracranial  mass, hemorrhage, or extra-axial fluid. There is a left frontal  scalp hematoma.  CT cervical spine: Incomplete fracture of the inferior aspect of the  odontoid, type III. Alignment anatomic. No other fractures.  Extensive arthropathy. Areas of spondylolisthesis appear stable  under felt to be due to underlying spondylosis. There is a sizable  free-flowing right pleural effusion.  01/28/14 CXR  IMPRESSION:  No or pneumothorax.    Assessment/Plan C1 cervical fracture 01/27/14-01/30/14 hospitalized NSU was also consulted for a questionable odontoid fracture, after review Dr. Ellene Route felt this was a stable C1 right sided impaction fracture which does not require any specific instruction   Pneumothorax She was found to have a right sided rib fracture with a small PTX. A follow up chest x ray showed a resolved PTX.  Progressive supranuclear palsy Frequent falling.    Right rib fracture 01/27/14 CXR IMPRESSION:  Displaced right posterior seventh rib fracture with right apical  pneumothorax. Right base infiltrate. Underlying emphysema. Left lung  clear.     Family/ Staff Communication: observe the patient.   Goals of Care: SNF  Labs/tests ordered: none

## 2014-02-11 ENCOUNTER — Non-Acute Institutional Stay (SKILLED_NURSING_FACILITY): Payer: Medicare Other | Admitting: Internal Medicine

## 2014-02-11 DIAGNOSIS — G231 Progressive supranuclear ophthalmoplegia [Steele-Richardson-Olszewski]: Secondary | ICD-10-CM | POA: Diagnosis not present

## 2014-02-11 DIAGNOSIS — T148 Other injury of unspecified body region: Secondary | ICD-10-CM | POA: Diagnosis not present

## 2014-02-11 DIAGNOSIS — S2231XS Fracture of one rib, right side, sequela: Secondary | ICD-10-CM | POA: Diagnosis not present

## 2014-02-11 DIAGNOSIS — T148XXA Other injury of unspecified body region, initial encounter: Secondary | ICD-10-CM

## 2014-02-11 DIAGNOSIS — R269 Unspecified abnormalities of gait and mobility: Secondary | ICD-10-CM

## 2014-02-11 DIAGNOSIS — R1312 Dysphagia, oropharyngeal phase: Secondary | ICD-10-CM

## 2014-02-11 DIAGNOSIS — J939 Pneumothorax, unspecified: Secondary | ICD-10-CM

## 2014-03-05 ENCOUNTER — Encounter: Payer: Self-pay | Admitting: *Deleted

## 2014-03-10 ENCOUNTER — Encounter: Payer: Self-pay | Admitting: Neurology

## 2014-03-12 ENCOUNTER — Encounter: Payer: Self-pay | Admitting: Nurse Practitioner

## 2014-03-12 ENCOUNTER — Non-Acute Institutional Stay (SKILLED_NURSING_FACILITY): Payer: Medicare Other | Admitting: Nurse Practitioner

## 2014-03-12 DIAGNOSIS — G311 Senile degeneration of brain, not elsewhere classified: Secondary | ICD-10-CM

## 2014-03-12 DIAGNOSIS — S12000S Unspecified displaced fracture of first cervical vertebra, sequela: Secondary | ICD-10-CM

## 2014-03-12 DIAGNOSIS — R269 Unspecified abnormalities of gait and mobility: Secondary | ICD-10-CM

## 2014-03-12 DIAGNOSIS — J939 Pneumothorax, unspecified: Secondary | ICD-10-CM

## 2014-03-12 DIAGNOSIS — R1312 Dysphagia, oropharyngeal phase: Secondary | ICD-10-CM

## 2014-03-12 DIAGNOSIS — G231 Progressive supranuclear ophthalmoplegia [Steele-Richardson-Olszewski]: Secondary | ICD-10-CM

## 2014-03-12 DIAGNOSIS — S2231XS Fracture of one rib, right side, sequela: Secondary | ICD-10-CM

## 2014-03-12 NOTE — Assessment & Plan Note (Signed)
Started June 2011. Patient has cerebral atrophy, cerebrovascular disease, and supranuclear palsy.

## 2014-03-12 NOTE — Progress Notes (Signed)
Patient ID: Monica Waller, female   DOB: [birth year], 80 y.o.   MRN: [id number]    Code Status: DNR  No Known Allergies  Chief Complaint  Patient presents with  . Medical Management of Chronic Issues    HPI: Patient is a 80 y.o. female seen in the SNF at [facility] today for evaluation of chronic medical conditions.    01/27/2014-01/29/2014 Right rib fracture Pneumothorax C1 right sided impaction fracture. She was found to have a right sided rib fracture with a small PTX. A follow up chest x ray showed a resolved PTX. NSU was also consulted for a questionable odontoid fracture, after review Dr. Ellene Route felt this was a stable C1 right sided impaction fracture which does not require any specific instruction   6/5/15ED evaluation for left forehead/left popliteal laceration-healed.    Problem List Items Addressed This Visit    Senile degeneration of brain    Noted on CT and MRI of the brain 10/14/09     Right rib fracture    01/27/14 CXR IMPRESSION:  Displaced right posterior seventh rib fracture with right apical  pneumothorax. Right base infiltrate. Underlying emphysema. Left lung  clear.  03/12/14 healed clinically, no pain today.     Progressive supranuclear palsy    Frequent falling.       Pneumothorax    She was found to have a right sided rib fracture with a small PTX. A follow up chest x ray showed a resolved PTX.     Gait disorder    Started June 2011. Patient has cerebral atrophy, cerebrovascular disease, and supranuclear palsy.     Dysphagia, oropharyngeal phase    Modify diet.     C1 cervical fracture - Primary    01/27/14-01/30/14 hospitalized NSU was also consulted for a questionable odontoid fracture, after review Dr. Ellene Route felt this was a stable C1 right sided impaction fracture which does not require any specific instruction         Review of Systems:  Review of Systems  Constitutional: Negative for fever, chills, weight loss, malaise/fatigue and  diaphoresis.  HENT: Positive for hearing loss. Negative for congestion, ear discharge, ear pain, nosebleeds, sore throat and tinnitus.   Eyes: Positive for double vision (?). Negative for blurred vision, photophobia, pain, discharge and redness.       ? supranuclear ophthalmoplegia  Respiratory: Negative for cough, hemoptysis, sputum production, shortness of breath, wheezing and stridor.   Cardiovascular: Negative for chest pain, palpitations, orthopnea, claudication, leg swelling and PND.  Gastrointestinal: Negative for heartburn, nausea, vomiting, abdominal pain, diarrhea, constipation, blood in stool and melena.  Genitourinary: Positive for frequency. Negative for dysuria, urgency, hematuria and flank pain.  Musculoskeletal: Positive for falls. Negative for myalgias, back pain, joint pain and neck pain.       Abnormal gait.   Skin: Negative for itching and rash.  Neurological: Negative for dizziness, tingling, tremors, sensory change, speech change, focal weakness, seizures, loss of consciousness, weakness and headaches.  Endo/Heme/Allergies: Negative for environmental allergies and polydipsia. Does not bruise/bleed easily.  Psychiatric/Behavioral: Positive for memory loss. Negative for depression, suicidal ideas, hallucinations and substance abuse. The patient is not nervous/anxious and does not have insomnia.      Past Medical History  Diagnosis Date  . Cerebral degeneration   . Ataxia   . Gait disorder   . Progressive supranuclear palsy   . Subdural hematoma     Hx. of a left frontal  . Senile degeneration of brain 10/14/09  Noted on CT and MRI of the brain 10/14/09  . Other generalized ischemic cerebrovascular disease 10/14/09    Noted on CT and MR brain 10/14/09  . Dysphagia, oropharyngeal phase   . Cancer     Breast  . Malignant neoplasm of breast (female), unspecified site   . Central retinal artery occlusion 12/23/2013    Left eye  . Supranuclear palsy    Past Surgical  History  Procedure Laterality Date  . Mastectomy Right 1998    Dr. Rebekah Chesterfield  . Tonsillectomy    . Cataract extraction, bilateral     Social History:   reports that she quit smoking about 48 years ago. Her smoking use included Cigarettes. She smoked 0.00 packs per day. She has never used smokeless tobacco. She reports that she does not drink alcohol or use illicit drugs.  Family History  Problem Relation Age of Onset  . Other Mother     "Old Age"  . Cancer Father   . Cancer Brother     Colon    Medications: Patient's Medications  New Prescriptions   No medications on file  Previous Medications   ACETAMINOPHEN (TYLENOL) 325 MG TABLET    Take 2 tablets (650 mg total) by mouth every 6 (six) hours as needed.   ASPIRIN 81 MG TABLET    Take 81 mg by mouth daily.   CALCIUM CARB-CHOLECALCIFEROL (CALTRATE 600+D) 600-800 MG-UNIT TABS    Take 1 tablet by mouth.   MULTIVITAMIN-IRON-MINERALS-FOLIC ACID (CENTRUM) CHEWABLE TABLET    Chew 1 tablet by mouth daily.   POLYVINYL ALCOHOL (ARTIFICIAL TEARS) 1.4 % OPHTHALMIC SOLUTION    1 drop as needed for dry eyes. *wait 2-5 minutes between eye drops , twice daily   SENNA (SENOKOT) 8.6 MG TABLET    Take 1 tablet by mouth 2 (two) times daily.  Modified Medications   No medications on file  Discontinued Medications   No medications on file     Physical Exam: Physical Exam  Constitutional: She is oriented to person, place, and time. She appears well-developed and well-nourished. No distress.  HENT:  Head: Normocephalic.  Right Ear: External ear normal.  Left Ear: External ear normal.  Nose: Nose normal.  Mouth/Throat: Oropharynx is clear and moist. No oropharyngeal exudate.  Eyes: Conjunctivae are normal. Right eye exhibits no discharge. Left eye exhibits no discharge. No scleral icterus.  Difficulty looking down followed by an upgaze ?palsy  Neck: Normal range of motion. Neck supple. No JVD present. No tracheal deviation present. No thyromegaly  present.  Cardiovascular: Normal rate, regular rhythm, normal heart sounds and intact distal pulses.   No murmur heard. Pulmonary/Chest: Effort normal and breath sounds normal. No stridor. No respiratory distress. She has no wheezes. She has no rales. She exhibits no tenderness.  Abdominal: Soft. Bowel sounds are normal. She exhibits no distension. There is no tenderness. There is no rebound and no guarding.  Musculoskeletal: Normal range of motion. She exhibits no edema or tenderness.  Lymphadenopathy:    She has no cervical adenopathy.  Neurological: She is alert and oriented to person, place, and time. She displays normal reflexes. A cranial nerve deficit is present. She exhibits normal muscle tone. Coordination abnormal.  Skin: Skin is warm and dry. No rash noted. She is not diaphoretic. No erythema. No pallor.     Psychiatric: She has a normal mood and affect. Her speech is normal and behavior is normal. Judgment and thought content normal. Cognition and memory are impaired. She does  not express impulsivity or inappropriate judgment. She exhibits abnormal recent memory. She exhibits normal remote memory.    Filed Vitals:   03/12/14 1426  BP: 137/74  Pulse: 64  Temp: 96.8 F (36 C)  TempSrc: Tympanic  Resp: 20    Labs reviewed: Basic Metabolic Panel:  Recent Labs  01/27/14 1647  NA 138  K 4.0  CL 103  CO2 21  GLUCOSE 93  BUN 26*  CREATININE 0.81  CALCIUM 9.5   CBC:  Recent Labs  01/27/14 1647 01/28/14 0500  WBC 8.1 6.6  HGB 13.4 13.3  HCT 40.3 38.8  MCV 92.0 92.8  PLT 273 241   Past Procedures:  01/16/13 CT head Wo Contrast:   IMPRESSION:  Atrophy and chronic microvascular ischemia. No acute abnormality   09/25/13 CT head, cervical spine, maxillary bones:   IMPRESSION:  No acute intracranial abnormality. Chronic and involutional changes  appreciated.  Left periorbital hematoma. No evidence of acute fracture nor  dislocation. Findings which may  represent sinus disease within the  ethmoid air cells.  Multilevel spondylosis within the cervical spine without acute  osseous abnormalities.  01/27/14 CXR  IMPRESSION:  Displaced right posterior seventh rib fracture with right apical  pneumothorax. Right base infiltrate. Underlying emphysema. Left lung  clear.  01/27/14 CT head and cervical spine:  IMPRESSION:  CT head: Diffuse opacification of most of the left mastoid air cells  as well as much of the left external auditory canal and essentially  the entire left middle ear region. Question traumatic versus  inflammatory etiology. No fracture or air-fluid level is seen in  this region. Mastoids and external auditory canal/middle ear on the  right are clear.  Atrophy with patchy small vessel disease, stable. No intracranial  mass, hemorrhage, or extra-axial fluid. There is a left frontal  scalp hematoma.  CT cervical spine: Incomplete fracture of the inferior aspect of the  odontoid, type III. Alignment anatomic. No other fractures.  Extensive arthropathy. Areas of spondylolisthesis appear stable  under felt to be due to underlying spondylosis. There is a sizable  free-flowing right pleural effusion.  01/28/14 CXR  IMPRESSION:  No or pneumothorax.    Assessment/Plan C1 cervical fracture 01/27/14-01/30/14 hospitalized NSU was also consulted for a questionable odontoid fracture, after review Dr. Ellene Route felt this was a stable C1 right sided impaction fracture which does not require any specific instruction    Dysphagia, oropharyngeal phase Modify diet.   Gait disorder Started June 2011. Patient has cerebral atrophy, cerebrovascular disease, and supranuclear palsy.   Senile degeneration of brain Noted on CT and MRI of the brain 10/14/09   Right rib fracture 01/27/14 CXR IMPRESSION:  Displaced right posterior seventh rib fracture with right apical  pneumothorax. Right base infiltrate. Underlying emphysema. Left lung    clear.  03/12/14 healed clinically, no pain today.   Progressive supranuclear palsy Frequent falling.     Pneumothorax She was found to have a right sided rib fracture with a small PTX. A follow up chest x ray showed a resolved PTX.     Family/ Staff Communication: observe the patient.   Goals of Care: SNF  Labs/tests ordered: none

## 2014-03-12 NOTE — Assessment & Plan Note (Signed)
Noted on CT and MRI of the brain 10/14/09  

## 2014-03-12 NOTE — Assessment & Plan Note (Signed)
She was found to have a right sided rib fracture with a small PTX. A follow up chest x ray showed a resolved PTX.

## 2014-03-12 NOTE — Assessment & Plan Note (Signed)
01/27/14 CXR IMPRESSION:  Displaced right posterior seventh rib fracture with right apical  pneumothorax. Right base infiltrate. Underlying emphysema. Left lung  clear.  03/12/14 healed clinically, no pain today.

## 2014-03-12 NOTE — Assessment & Plan Note (Signed)
Modify diet.

## 2014-03-12 NOTE — Assessment & Plan Note (Signed)
01/27/14-01/30/14 hospitalized NSU was also consulted for a questionable odontoid fracture, after review Dr. Ellene Route felt this was a stable C1 right sided impaction fracture which does not require any specific instruction

## 2014-03-12 NOTE — Assessment & Plan Note (Signed)
Frequent falling.

## 2014-03-16 ENCOUNTER — Encounter: Payer: Self-pay | Admitting: Neurology

## 2014-03-22 ENCOUNTER — Encounter: Payer: Self-pay | Admitting: Neurology

## 2014-03-22 ENCOUNTER — Ambulatory Visit (INDEPENDENT_AMBULATORY_CARE_PROVIDER_SITE_OTHER): Payer: Medicare Other | Admitting: Neurology

## 2014-03-22 VITALS — BP 116/71 | HR 76

## 2014-03-22 DIAGNOSIS — H3412 Central retinal artery occlusion, left eye: Secondary | ICD-10-CM

## 2014-03-22 DIAGNOSIS — G231 Progressive supranuclear ophthalmoplegia [Steele-Richardson-Olszewski]: Secondary | ICD-10-CM

## 2014-03-22 NOTE — Patient Instructions (Signed)
Progressive Supranuclear Palsy Progressive supranuclear palsy is a rare brain disorder that causes a gradual deterioration of cells at the base of the brain. It causes serious problems with walking, eye movement, and balance. It may also cause behavior changes, depression, and loss of mental capacity (dementia).  Progressive supranuclear palsy tends to get worse over time.  CAUSES The exact cause of progressive supranuclear palsy is not known. In some cases, the condition may be caused by genes passed down through families (inherited). RISK FACTORS Progressive supranuclear palsy usually begins after age 60 and is slightly more common in men. SIGNS AND SYMPTOMS  The pattern of signs and symptoms can vary greatly from person to person. Signs and symptoms may include:  Loss of balance while walking.  Walking that is stiff and awkward.  Unexplained falls.  Inability to focus the eyes properly or blurred vision.  Changes in mood and behavior. These include:  Lack of feeling or emotion (apathy).  Irritability.  Sudden laughing or crying.  Personality changes.  Progressive mild dementia.  Difficulty swallowing and speaking. DIAGNOSIS  Your health care provider can diagnose progressive supranuclear palsy from your signs and symptoms. The main symptoms your health care provider will check for are:  Poor balance.  Vision problems.  Slurred speech.  Mental or behavioral changes. Your health care provider may also do some tests to rule out other causes of your symptoms. This may include checking your spinal fluid for abnormal proteins and taking images of your brain to look for areas of nerve cell loss. TREATMENT There is no cure for progressive supranuclear palsy. No treatment will slow the progression of the disease. Your treatment will be based on your symptoms. Treatment may include:  Medicines for Parkinson's disease. These may help with slowness, stiffness, and balance  problems.  Antidepressant medicines to treat depression.  Glasses called prisms to help with blurry vision.  Walking aids to prevent falls.  A surgical procedure to place a feeding tube into your stomach (gastrostomy) if swallowing becomes very hard. HOME CARE INSTRUCTIONS Work closely with your team of health care providers. Your team may include:  A physical therapist. Have him or her help you come up with a safe exercise program.  A speech and language therapist. Have him or her teach you ways to swallow foods and liquids safely. This specialist can also help you speak more clearly.  An occupational therapist. Have him or her help you learn to use walking aids and make your home safe. SEEK MEDICAL CARE IF:  You have chills or fever.  Your symptoms are getting worse.  You develop new symptoms.  You are having trouble getting enough nutrition.  You have a cough that will not go away.  You have shortness of breath.  You are anxious or depressed.  You are not getting enough support at home. SEEK IMMEDIATE MEDICAL CARE IF:  You choke, cough, or have trouble breathing after eating or drinking.  You hurt yourself in a fall.  You no longer feel safe at home. Document Released: 03/30/2002 Document Revised: 08/24/2013 Document Reviewed: 04/08/2013 [employer] Patient Information 2015 [employer]. This information is not intended to replace advice given to you by your health care provider. Make sure you discuss any questions you have with your health care provider.  

## 2014-03-22 NOTE — Progress Notes (Signed)
Reason for visit: Progressive supranuclear palsy  Monica Waller is an 80 y.o. female  History of present illness:  Monica Waller is an 80 year old left-handed white female with a history of progressive supranuclear palsy. 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 The patient did not know when the actual event occurred, and the event was painless in onset. The patient has undergone a cerebrovascular workup that revealed an unremarkable MRI of the brain, carotid Doppler studies were unremarkable, and a sedimentation rate was normal. The patient has been placed on low-dose aspirin therapy. She has not regained any of her sight in the left eye, but no visual changes have been noted either. The patient has not been able to read secondary to severe restrictions and eye movements in all fields of gaze. The patient is nonambulatory, and she is having increasing difficulty with speech. The patient reports difficulty with swallowing, but she does not wish to have a swallowing evaluation. She returns for an evaluation.  Past Medical History  Diagnosis Date  . Cerebral degeneration   . Ataxia   . Gait disorder   . Progressive supranuclear palsy   . Subdural hematoma     Hx. of a left frontal  . 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 Other generalized ischemic cerebrovascular disease 10/14/09    Noted on CT and MR brain 10/14/09  . Dysphagia, oropharyngeal phase   . Cancer     Breast  . Malignant neoplasm of breast (female), unspecified site   . Central retinal artery occlusion 12/23/2013    Left eye  . Supranuclear palsy     Past Surgical History  Procedure Laterality Date  . Mastectomy Right 1998    Dr. Rebekah Chesterfield  . Tonsillectomy    . Cataract extraction, bilateral      Family History  Problem Relation Age of Onset  . Other Mother     "Old Age"  . Cancer Father   . Cancer Brother    Colon    Social history:  reports that she quit smoking about 48 years ago. Her smoking use included Cigarettes. She smoked 0.00 packs per day. She has never used smokeless tobacco. She reports that she does not drink alcohol or use illicit drugs.   No Known Allergies  Medications:  Current Outpatient Prescriptions on File Prior to Visit  Medication Sig Dispense Refill  . acetaminophen (TYLENOL) 325 MG tablet Take 2 tablets (650 mg total) by mouth every 6 (six) hours as needed.    Marland Kitchen aspirin 81 MG tablet Take 81 mg by mouth daily.    . Calcium Carb-Cholecalciferol (CALTRATE 600+D) 600-800 MG-UNIT TABS Take 1 tablet by mouth.    . multivitamin-iron-minerals-folic acid (CENTRUM) chewable tablet Chew 1 tablet by mouth daily.    . polyvinyl alcohol (ARTIFICIAL TEARS) 1.4 % ophthalmic solution 1 drop as needed for dry eyes. *wait 2-5 minutes between eye drops , twice daily    . senna (SENOKOT) 8.6 MG tablet Take 1 tablet by mouth 2 (two) times daily.     No current facility-administered medications on file prior to visit.    ROS:  Out of a complete 14 system review of symptoms, the patient complains only of the following symptoms, and all other reviewed systems are negative.  Difficulty swallowing Visual loss  Blood pressure 116/71, pulse 76, height 0' (0 m), weight  0 lb (0 kg).  Physical Exam  General: The patient is alert and cooperative at the time of the examination.  Skin: No significant peripheral edema is noted.   Neurologic Exam  Mental status: The patient is oriented x 3.  Cranial nerves: Facial symmetry is present. Speech is slightly dysarthric. Extraocular movements are significantly restricted in a vertical plane. The patient has difficulty tracking objects horizontally. Visual fields are full with the right eye., the patient does not have count fingers with the left eye. Masking of the face is noted.  Motor: The patient has good strength in all 4  extremities.  Sensory examination: Soft touch sensation is symmetric on the face, arms, legs. Pinprick sensation is also symmetric throughout.  Coordination: The patient has good finger-nose-finger and heel-to-shin bilaterally.Rapid alternating movements are impaired bilaterally, somewhat worse on the right.  Gait and station: The patient is not ambulatory, she is in a wheelchair, gait was not tested.  Reflexes: Deep tendon reflexes are symmetric.   Assessment/Plan:  1. Progressive supranuclear palsy  2. Gait disorder  3. Ischemic optic neuropathy, OS  The patient will continue low-dose aspirin. The patient is reporting some difficulty with swallowing, but she does not wish to have further evaluation. If she changes her mind in the future, we will set up a speech therapy evaluation. Otherwise, the patient will follow-up in 6-8 months.  Jill Alexanders MD 03/22/2014 6:39 PM  [facility] [address] [place], [place] [address]  Phone [phone] Fax [phone]

## 2014-03-30 ENCOUNTER — Encounter: Payer: Self-pay | Admitting: Nurse Practitioner

## 2014-03-30 ENCOUNTER — Non-Acute Institutional Stay (SKILLED_NURSING_FACILITY): Payer: Medicare Other | Admitting: Nurse Practitioner

## 2014-03-30 DIAGNOSIS — S2231XS Fracture of one rib, right side, sequela: Secondary | ICD-10-CM

## 2014-03-30 DIAGNOSIS — S12000S Unspecified displaced fracture of first cervical vertebra, sequela: Secondary | ICD-10-CM

## 2014-03-30 DIAGNOSIS — G311 Senile degeneration of brain, not elsewhere classified: Secondary | ICD-10-CM

## 2014-03-30 DIAGNOSIS — R1312 Dysphagia, oropharyngeal phase: Secondary | ICD-10-CM

## 2014-03-30 DIAGNOSIS — G231 Progressive supranuclear ophthalmoplegia [Steele-Richardson-Olszewski]: Secondary | ICD-10-CM

## 2014-03-30 DIAGNOSIS — R269 Unspecified abnormalities of gait and mobility: Secondary | ICD-10-CM

## 2014-03-30 DIAGNOSIS — J939 Pneumothorax, unspecified: Secondary | ICD-10-CM

## 2014-03-30 DIAGNOSIS — W19XXXS Unspecified fall, sequela: Secondary | ICD-10-CM

## 2014-03-30 NOTE — Assessment & Plan Note (Signed)
Modify diet. Risk for aspiration.    

## 2014-03-30 NOTE — Assessment & Plan Note (Signed)
Frequent falling.

## 2014-03-30 NOTE — Assessment & Plan Note (Signed)
01/27/14 CXR IMPRESSION:  Displaced right posterior seventh rib fracture with right apical  pneumothorax. Right base infiltrate. Underlying emphysema. Left lung  clear.  03/12/14 healed clinically, no pain today.   03/26/14 no c/o pain

## 2014-03-30 NOTE — Assessment & Plan Note (Signed)
Started June 2011. Patient has cerebral atrophy, cerebrovascular disease, and supranuclear palsy. 03/26/14 last fall was 03/25/14 w/o apparent injury.

## 2014-03-30 NOTE — Assessment & Plan Note (Signed)
She was found to have a right sided rib fracture with a small PTX. A follow up chest x ray showed a resolved PTX.

## 2014-03-30 NOTE — Assessment & Plan Note (Signed)
Patient has previously been counseled regarding home safety. This includes arranging furniture so that she has something to hold onto, use of the walker, and use of grab rails that are installed in her bathroom 03/25/14 found sitting on floor.

## 2014-03-30 NOTE — Progress Notes (Signed)
Patient ID: Monica Waller, female   DOB: [birth year], 80 y.o.   MRN: [id number]    Code Status: DNR  No Known Allergies  Chief Complaint  Patient presents with  . Medical Management of Chronic Issues  . Acute Visit    fall    HPI: Patient is a 80 y.o. female seen in the SNF at [facility] today for evaluation of fall 03/25/14 and chronic medical conditions.    01/27/2014-01/29/2014 Right rib fracture Pneumothorax C1 right sided impaction fracture. She was found to have a right sided rib fracture with a small PTX. A follow up chest x ray showed a resolved PTX. NSU was also consulted for a questionable odontoid fracture, after review Dr. Ellene Route felt this was a stable C1 right sided impaction fracture which does not require any specific instruction   6/5/15ED evaluation for left forehead/left popliteal laceration-healed.    Problem List Items Addressed This Visit    Senile degeneration of brain    Noted on CT and MRI of the brain 10/14/09     Right rib fracture    01/27/14 CXR IMPRESSION:  Displaced right posterior seventh rib fracture with right apical  pneumothorax. Right base infiltrate. Underlying emphysema. Left lung  clear.  03/12/14 healed clinically, no pain today.   03/26/14 no c/o pain     Progressive supranuclear palsy    Frequent falling.      Pneumothorax    She was found to have a right sided rib fracture with a small PTX. A follow up chest x ray showed a resolved PTX.      Gait disorder    Started June 2011. Patient has cerebral atrophy, cerebrovascular disease, and supranuclear palsy. 03/26/14 last fall was 03/25/14 w/o apparent injury.      Fall - Primary    Patient has previously been counseled regarding home safety. This includes arranging furniture so that she has something to hold onto, use of the walker, and use of grab rails that are installed in her bathroom 03/25/14 found sitting on floor.      Dysphagia, oropharyngeal phase    Modify diet.  Risk for aspiration.      C1 cervical fracture    01/27/14-01/30/14 hospitalized NSU was also consulted for a questionable odontoid fracture, after review Dr. Ellene Route felt this was a stable C1 right sided impaction fracture which does not require any specific instruction         Review of Systems:  Review of Systems  Constitutional: Negative for fever, chills, weight loss, malaise/fatigue and diaphoresis.  HENT: Positive for hearing loss. Negative for congestion, ear discharge, ear pain, nosebleeds, sore throat and tinnitus.   Eyes: Positive for double vision (?). Negative for blurred vision, photophobia, pain, discharge and redness.       ? supranuclear ophthalmoplegia  Respiratory: Negative for cough, hemoptysis, sputum production, shortness of breath, wheezing and stridor.   Cardiovascular: Negative for chest pain, palpitations, orthopnea, claudication, leg swelling and PND.  Gastrointestinal: Negative for heartburn, nausea, vomiting, abdominal pain, diarrhea, constipation, blood in stool and melena.  Genitourinary: Positive for frequency. Negative for dysuria, urgency, hematuria and flank pain.  Musculoskeletal: Positive for falls. Negative for myalgias, back pain, joint pain and neck pain.       Abnormal gait.  03/25/14 found on floor in upright sitting.   Skin: Negative for itching and rash.  Neurological: Negative for dizziness, tingling, tremors, sensory change, speech change, focal weakness, seizures, loss of consciousness, weakness and headaches.  Endo/Heme/Allergies: Negative for environmental allergies and polydipsia. Does not bruise/bleed easily.  Psychiatric/Behavioral: Positive for memory loss. Negative for depression, suicidal ideas, hallucinations and substance abuse. The patient is not nervous/anxious and does not have insomnia.      Past Medical History  Diagnosis Date  . Cerebral degeneration   . Ataxia   . Gait disorder   . Progressive supranuclear palsy   .  Subdural hematoma     Hx. of a left frontal  . 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 Other generalized ischemic cerebrovascular disease 10/14/09    Noted on CT and MR brain 10/14/09  . Dysphagia, oropharyngeal phase   . Cancer     Breast  . Malignant neoplasm of breast (female), unspecified site   . Central retinal artery occlusion 12/23/2013    Left eye  . Supranuclear palsy    Past Surgical History  Procedure Laterality Date  . Mastectomy Right 1998    Dr. Rebekah Chesterfield  . Tonsillectomy    . Cataract extraction, bilateral     Social History:   reports that she quit smoking about 48 years ago. Her smoking use included Cigarettes. She smoked 0.00 packs per day. She has never used smokeless tobacco. She reports that she does not drink alcohol or use illicit drugs.  Family History  Problem Relation Age of Onset  . Other Mother     "Old Age"  . Cancer Father   . Cancer Brother     Colon    Medications: Patient's Medications  New Prescriptions   No medications on file  Previous Medications   ACETAMINOPHEN (TYLENOL) 325 MG TABLET    Take 2 tablets (650 mg total) by mouth every 6 (six) hours as needed.   ASPIRIN 81 MG TABLET    Take 81 mg by mouth daily.   CALCIUM CARB-CHOLECALCIFEROL (CALTRATE 600+D) 600-800 MG-UNIT TABS    Take 1 tablet by mouth.   MAGNESIUM HYDROXIDE (MILK OF MAGNESIA) 400 MG/5ML SUSPENSION    Take 30 mLs by mouth daily as needed for mild constipation.   MULTIVITAMIN-IRON-MINERALS-FOLIC ACID (CENTRUM) CHEWABLE TABLET    Chew 1 tablet by mouth daily.   POLYVINYL ALCOHOL (ARTIFICIAL TEARS) 1.4 % OPHTHALMIC SOLUTION    1 drop as needed for dry eyes. *wait 2-5 minutes between eye drops , twice daily   SENNA (SENOKOT) 8.6 MG TABLET    Take 1 tablet by mouth 2 (two) times daily.  Modified Medications   No medications on file  Discontinued Medications   No medications on file     Physical Exam: Physical Exam    Constitutional: She is oriented to person, place, and time. She appears well-developed and well-nourished. No distress.  HENT:  Head: Normocephalic.  Right Ear: External ear normal.  Left Ear: External ear normal.  Nose: Nose normal.  Mouth/Throat: Oropharynx is clear and moist. No oropharyngeal exudate.  Eyes: Conjunctivae are normal. Right eye exhibits no discharge. Left eye exhibits no discharge. No scleral icterus.  Difficulty looking down followed by an upgaze ?palsy  Neck: Normal range of motion. Neck supple. No JVD present. No tracheal deviation present. No thyromegaly present.  Cardiovascular: Normal rate, regular rhythm, normal heart sounds and intact distal pulses.   No murmur heard. Pulmonary/Chest: Effort normal and breath sounds normal. No stridor. No respiratory distress. She has no wheezes. She has no rales. She exhibits no tenderness.  Abdominal: Soft. Bowel sounds are normal. She  exhibits no distension. There is no tenderness. There is no rebound and no guarding.  Musculoskeletal: Normal range of motion. She exhibits no edema or tenderness.  Lymphadenopathy:    She has no cervical adenopathy.  Neurological: She is alert and oriented to person, place, and time. She displays normal reflexes. A cranial nerve deficit is present. She exhibits normal muscle tone. Coordination abnormal.  Skin: Skin is warm and dry. No rash noted. She is not diaphoretic. No erythema. No pallor.     Psychiatric: She has a normal mood and affect. Her speech is normal and behavior is normal. Judgment and thought content normal. Cognition and memory are impaired. She does not express impulsivity or inappropriate judgment. She exhibits abnormal recent memory. She exhibits normal remote memory.    Filed Vitals:   03/30/14 1631  BP: 148/74  Pulse: 82  Temp: 97.2 F (36.2 C)  TempSrc: Tympanic  Resp: 18    Labs reviewed: Basic Metabolic Panel:  Recent Labs  01/27/14 1647  NA 138  K 4.0   CL 103  CO2 21  GLUCOSE 93  BUN 26*  CREATININE 0.81  CALCIUM 9.5   CBC:  Recent Labs  01/27/14 1647 01/28/14 0500  WBC 8.1 6.6  HGB 13.4 13.3  HCT 40.3 38.8  MCV 92.0 92.8  PLT 273 241   Past Procedures:  01/16/13 CT head Wo Contrast:   IMPRESSION:  Atrophy and chronic microvascular ischemia. No acute abnormality   09/25/13 CT head, cervical spine, maxillary bones:   IMPRESSION:  No acute intracranial abnormality. Chronic and involutional changes  appreciated.  Left periorbital hematoma. No evidence of acute fracture nor  dislocation. Findings which may represent sinus disease within the  ethmoid air cells.  Multilevel spondylosis within the cervical spine without acute  osseous abnormalities.  01/27/14 CXR  IMPRESSION:  Displaced right posterior seventh rib fracture with right apical  pneumothorax. Right base infiltrate. Underlying emphysema. Left lung  clear.  01/27/14 CT head and cervical spine:  IMPRESSION:  CT head: Diffuse opacification of most of the left mastoid air cells  as well as much of the left external auditory canal and essentially  the entire left middle ear region. Question traumatic versus  inflammatory etiology. No fracture or air-fluid level is seen in  this region. Mastoids and external auditory canal/middle ear on the  right are clear.  Atrophy with patchy small vessel disease, stable. No intracranial  mass, hemorrhage, or extra-axial fluid. There is a left frontal  scalp hematoma.  CT cervical spine: Incomplete fracture of the inferior aspect of the  odontoid, type III. Alignment anatomic. No other fractures.  Extensive arthropathy. Areas of spondylolisthesis appear stable  under felt to be due to underlying spondylosis. There is a sizable  free-flowing right pleural effusion.  01/28/14 CXR  IMPRESSION:  No or pneumothorax.    Assessment/Plan Fall Patient has previously been counseled regarding home safety. This includes  arranging furniture so that she has something to hold onto, use of the walker, and use of grab rails that are installed in her bathroom 03/25/14 found sitting on floor.    Senile degeneration of brain Noted on CT and MRI of the brain 10/14/09   Right rib fracture 01/27/14 CXR IMPRESSION:  Displaced right posterior seventh rib fracture with right apical  pneumothorax. Right base infiltrate. Underlying emphysema. Left lung  clear.  03/12/14 healed clinically, no pain today.   03/26/14 no c/o pain   Progressive supranuclear palsy Frequent falling.  Pneumothorax She was found to have a right sided rib fracture with a small PTX. A follow up chest x ray showed a resolved PTX.    Gait disorder Started June 2011. Patient has cerebral atrophy, cerebrovascular disease, and supranuclear palsy. 03/26/14 last fall was 03/25/14 w/o apparent injury.    C1 cervical fracture 01/27/14-01/30/14 hospitalized NSU was also consulted for a questionable odontoid fracture, after review Dr. Ellene Route felt this was a stable C1 right sided impaction fracture which does not require any specific instruction    Dysphagia, oropharyngeal phase Modify diet. Risk for aspiration.      Family/ Staff Communication: observe the patient.   Goals of Care: SNF  Labs/tests ordered: none

## 2014-03-30 NOTE — Assessment & Plan Note (Signed)
01/27/14-01/30/14 hospitalized NSU was also consulted for a questionable odontoid fracture, after review Dr. Ellene Route felt this was a stable C1 right sided impaction fracture which does not require any specific instruction

## 2014-03-30 NOTE — Assessment & Plan Note (Signed)
Noted on CT and MRI of the brain 10/14/09  

## 2014-04-13 ENCOUNTER — Encounter: Payer: Self-pay | Admitting: Internal Medicine

## 2014-04-30 ENCOUNTER — Non-Acute Institutional Stay (SKILLED_NURSING_FACILITY): Payer: Medicare Other | Admitting: Nurse Practitioner

## 2014-04-30 ENCOUNTER — Encounter: Payer: Self-pay | Admitting: Nurse Practitioner

## 2014-04-30 DIAGNOSIS — G311 Senile degeneration of brain, not elsewhere classified: Secondary | ICD-10-CM

## 2014-04-30 DIAGNOSIS — R269 Unspecified abnormalities of gait and mobility: Secondary | ICD-10-CM

## 2014-04-30 DIAGNOSIS — G231 Progressive supranuclear ophthalmoplegia [Steele-Richardson-Olszewski]: Secondary | ICD-10-CM

## 2014-04-30 DIAGNOSIS — S12000S Unspecified displaced fracture of first cervical vertebra, sequela: Secondary | ICD-10-CM

## 2014-04-30 DIAGNOSIS — S2231XS Fracture of one rib, right side, sequela: Secondary | ICD-10-CM

## 2014-04-30 NOTE — Assessment & Plan Note (Signed)
Started June 2011. Patient has cerebral atrophy, cerebrovascular disease, and supranuclear palsy. 03/26/14 last fall was 03/25/14 w/o apparent injury.  04/30/14 supervision, w/c, SNF

## 2014-04-30 NOTE — Progress Notes (Signed)
Patient ID: Monica Waller, female   DOB: 06/29/1923, 81 y.o.   MRN: [id number]    Code Status: DNR  No Known Allergies  Chief Complaint  Patient presents with  . Medical Management of Chronic Issues    HPI: Patient is a 81 y.o. female seen in the SNF at [facility] today for evaluation of chronic medical conditions.    01/27/2014-01/29/2014 Right rib fracture Pneumothorax C1 right sided impaction fracture. She was found to have a right sided rib fracture with a small PTX. A follow up chest x ray showed a resolved PTX. NSU was also consulted for a questionable odontoid fracture, after review Dr. Ellene Route felt this was a stable C1 right sided impaction fracture which does not require any specific instruction   6/5/15ED evaluation for left forehead/left popliteal laceration-healed.    Problem List Items Addressed This Visit    Senile degeneration of brain - Primary    Noted on CT and MRI of the brain 10/14/09     Right rib fracture    01/27/14 CXR IMPRESSION:  Displaced right posterior seventh rib fracture with right apical  pneumothorax. Right base infiltrate. Underlying emphysema. Left lung  clear.  03/12/14 healed clinically, no pain today.   03/26/14 no c/o pain  04/30/14 no pain palpated upon my examination today.     Progressive supranuclear palsy    Frequent falling. 03/22/14 Neurology: f/u 6 mos. May ST MBS if swallow became a significant problem.       Gait disorder    Started June 2011. Patient has cerebral atrophy, cerebrovascular disease, and supranuclear palsy. 03/26/14 last fall was 03/25/14 w/o apparent injury.  04/30/14 supervision, w/c, SNF      C1 cervical fracture    01/27/14-01/30/14 hospitalized NSU was also consulted for a questionable odontoid fracture, after review Dr. Ellene Route felt this was a stable C1 right sided impaction fracture which does not require any specific instruction 04/30/14 stable.        Review of Systems:  Review of Systems    Constitutional: Negative for fever, chills, weight loss, malaise/fatigue and diaphoresis.  HENT: Positive for hearing loss. Negative for congestion, ear discharge, ear pain, nosebleeds, sore throat and tinnitus.   Eyes: Positive for double vision (?). Negative for blurred vision, photophobia, pain, discharge and redness.       ? supranuclear ophthalmoplegia  Respiratory: Negative for cough, hemoptysis, sputum production, shortness of breath, wheezing and stridor.   Cardiovascular: Negative for chest pain, palpitations, orthopnea, claudication, leg swelling and PND.  Gastrointestinal: Negative for heartburn, nausea, vomiting, abdominal pain, diarrhea, constipation, blood in stool and melena.  Genitourinary: Positive for frequency. Negative for dysuria, urgency, hematuria and flank pain.  Musculoskeletal: Positive for falls. Negative for myalgias, back pain, joint pain and neck pain.       Abnormal gait.  03/25/14 found on floor in upright sitting.   Skin: Negative for itching and rash.  Neurological: Negative for dizziness, tingling, tremors, sensory change, speech change, focal weakness, seizures, loss of consciousness, weakness and headaches.  Endo/Heme/Allergies: Negative for environmental allergies and polydipsia. Does not bruise/bleed easily.  Psychiatric/Behavioral: Positive for memory loss. Negative for depression, suicidal ideas, hallucinations and substance abuse. The patient is not nervous/anxious and does not have insomnia.      Past Medical History  Diagnosis Date  . Cerebral degeneration   . Ataxia   . Gait disorder   . Progressive supranuclear palsy   . Subdural hematoma     Hx. of a  left frontal  . 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 Other generalized ischemic cerebrovascular disease 10/14/09    Noted on CT and MR brain 10/14/09  . Dysphagia, oropharyngeal phase   . Cancer     Breast  . Malignant neoplasm of breast  (female), unspecified site   . Central retinal artery occlusion 12/23/2013    Left eye  . Supranuclear palsy    Past Surgical History  Procedure Laterality Date  . Mastectomy Right 1998    Dr. Rebekah Chesterfield  . Tonsillectomy    . Cataract extraction, bilateral     Social History:   reports that she quit smoking about 49 years ago. Her smoking use included Cigarettes. She smoked 0.00 packs per day. She has never used smokeless tobacco. She reports that she does not drink alcohol or use illicit drugs.  Family History  Problem Relation Age of Onset  . Other Mother     "Old Age"  . Cancer Father   . Cancer Brother     Colon    Medications: Patient's Medications  New Prescriptions   No medications on file  Previous Medications   ACETAMINOPHEN (TYLENOL) 325 MG TABLET    Take 2 tablets (650 mg total) by mouth every 6 (six) hours as needed.   ASPIRIN 81 MG TABLET    Take 81 mg by mouth daily.   CALCIUM CARB-CHOLECALCIFEROL (CALTRATE 600+D) 600-800 MG-UNIT TABS    Take 1 tablet by mouth.   MAGNESIUM HYDROXIDE (MILK OF MAGNESIA) 400 MG/5ML SUSPENSION    Take 30 mLs by mouth daily as needed for mild constipation.   MULTIVITAMIN-IRON-MINERALS-FOLIC ACID (CENTRUM) CHEWABLE TABLET    Chew 1 tablet by mouth daily.   POLYVINYL ALCOHOL (ARTIFICIAL TEARS) 1.4 % OPHTHALMIC SOLUTION    1 drop as needed for dry eyes. *wait 2-5 minutes between eye drops , twice daily   SENNA (SENOKOT) 8.6 MG TABLET    Take 1 tablet by mouth 2 (two) times daily.  Modified Medications   No medications on file  Discontinued Medications   No medications on file     Physical Exam: Physical Exam  Constitutional: She is oriented to person, place, and time. She appears well-developed and well-nourished. No distress.  HENT:  Head: Normocephalic.  Right Ear: External ear normal.  Left Ear: External ear normal.  Nose: Nose normal.  Mouth/Throat: Oropharynx is clear and moist. No oropharyngeal exudate.  Eyes: Conjunctivae are  normal. Right eye exhibits no discharge. Left eye exhibits no discharge. No scleral icterus.  Difficulty looking down followed by an upgaze ?palsy  Neck: Normal range of motion. Neck supple. No JVD present. No tracheal deviation present. No thyromegaly present.  Cardiovascular: Normal rate, regular rhythm, normal heart sounds and intact distal pulses.   No murmur heard. Pulmonary/Chest: Effort normal and breath sounds normal. No stridor. No respiratory distress. She has no wheezes. She has no rales. She exhibits no tenderness.  Abdominal: Soft. Bowel sounds are normal. She exhibits no distension. There is no tenderness. There is no rebound and no guarding.  Musculoskeletal: Normal range of motion. She exhibits no edema or tenderness.  Lymphadenopathy:    She has no cervical adenopathy.  Neurological: She is alert and oriented to person, place, and time. She displays normal reflexes. A cranial nerve deficit is present. She exhibits normal muscle tone. Coordination abnormal.  Skin: Skin is warm and dry. No rash noted. She is not diaphoretic.  No erythema. No pallor.     Psychiatric: She has a normal mood and affect. Her speech is normal and behavior is normal. Judgment and thought content normal. Cognition and memory are impaired. She does not express impulsivity or inappropriate judgment. She exhibits abnormal recent memory. She exhibits normal remote memory.    Filed Vitals:   04/30/14 1316  BP: 136/61  Pulse: 66  Temp: 97.4 F (36.3 C)  TempSrc: Tympanic  Resp: 20    Labs reviewed: Basic Metabolic Panel:  Recent Labs  01/27/14 1647  NA 138  K 4.0  CL 103  CO2 21  GLUCOSE 93  BUN 26*  CREATININE 0.81  CALCIUM 9.5   CBC:  Recent Labs  01/27/14 1647 01/28/14 0500  WBC 8.1 6.6  HGB 13.4 13.3  HCT 40.3 38.8  MCV 92.0 92.8  PLT 273 241   Past Procedures:  01/16/13 CT head Wo Contrast:   IMPRESSION:  Atrophy and chronic microvascular ischemia. No acute abnormality    09/25/13 CT head, cervical spine, maxillary bones:   IMPRESSION:  No acute intracranial abnormality. Chronic and involutional changes  appreciated.  Left periorbital hematoma. No evidence of acute fracture nor  dislocation. Findings which may represent sinus disease within the  ethmoid air cells.  Multilevel spondylosis within the cervical spine without acute  osseous abnormalities.  01/27/14 CXR  IMPRESSION:  Displaced right posterior seventh rib fracture with right apical  pneumothorax. Right base infiltrate. Underlying emphysema. Left lung  clear.  01/27/14 CT head and cervical spine:  IMPRESSION:  CT head: Diffuse opacification of most of the left mastoid air cells  as well as much of the left external auditory canal and essentially  the entire left middle ear region. Question traumatic versus  inflammatory etiology. No fracture or air-fluid level is seen in  this region. Mastoids and external auditory canal/middle ear on the  right are clear.  Atrophy with patchy small vessel disease, stable. No intracranial  mass, hemorrhage, or extra-axial fluid. There is a left frontal  scalp hematoma.  CT cervical spine: Incomplete fracture of the inferior aspect of the  odontoid, type III. Alignment anatomic. No other fractures.  Extensive arthropathy. Areas of spondylolisthesis appear stable  under felt to be due to underlying spondylosis. There is a sizable  free-flowing right pleural effusion.  01/28/14 CXR  IMPRESSION:  No or pneumothorax.    Assessment/Plan Senile degeneration of brain Noted on CT and MRI of the brain 10/14/09   Right rib fracture 01/27/14 CXR IMPRESSION:  Displaced right posterior seventh rib fracture with right apical  pneumothorax. Right base infiltrate. Underlying emphysema. Left lung  clear.  03/12/14 healed clinically, no pain today.   03/26/14 no c/o pain  04/30/14 no pain palpated upon my examination today.   Progressive supranuclear  palsy Frequent falling. 03/22/14 Neurology: f/u 6 mos. May ST MBS if swallow became a significant problem.     Gait disorder Started June 2011. Patient has cerebral atrophy, cerebrovascular disease, and supranuclear palsy. 03/26/14 last fall was 03/25/14 w/o apparent injury.  04/30/14 supervision, w/c, SNF    C1 cervical fracture 01/27/14-01/30/14 hospitalized NSU was also consulted for a questionable odontoid fracture, after review Dr. Ellene Route felt this was a stable C1 right sided impaction fracture which does not require any specific instruction 04/30/14 stable.     Family/ Staff Communication: observe the patient.   Goals of Care: SNF  Labs/tests ordered: none

## 2014-04-30 NOTE — Assessment & Plan Note (Signed)
01/27/14 CXR IMPRESSION:  Displaced right posterior seventh rib fracture with right apical  pneumothorax. Right base infiltrate. Underlying emphysema. Left lung  clear.  03/12/14 healed clinically, no pain today.   03/26/14 no c/o pain  04/30/14 no pain palpated upon my examination today.

## 2014-04-30 NOTE — Assessment & Plan Note (Signed)
Noted on CT and MRI of the brain 10/14/09

## 2014-04-30 NOTE — Assessment & Plan Note (Signed)
01/27/14-01/30/14 hospitalized NSU was also consulted for a questionable odontoid fracture, after review Dr. Ellene Route felt this was a stable C1 right sided impaction fracture which does not require any specific instruction 04/30/14 stable.

## 2014-04-30 NOTE — Assessment & Plan Note (Addendum)
Frequent falling. 03/22/14 Neurology: f/u 6 mos. May ST MBS if swallow became a significant problem.

## 2014-06-11 ENCOUNTER — Non-Acute Institutional Stay (SKILLED_NURSING_FACILITY): Payer: Medicare Other | Admitting: Nurse Practitioner

## 2014-06-11 ENCOUNTER — Encounter: Payer: Self-pay | Admitting: Nurse Practitioner

## 2014-06-11 DIAGNOSIS — G311 Senile degeneration of brain, not elsewhere classified: Secondary | ICD-10-CM

## 2014-06-11 DIAGNOSIS — S2231XS Fracture of one rib, right side, sequela: Secondary | ICD-10-CM

## 2014-06-11 DIAGNOSIS — G231 Progressive supranuclear ophthalmoplegia [Steele-Richardson-Olszewski]: Secondary | ICD-10-CM

## 2014-06-11 DIAGNOSIS — S12000S Unspecified displaced fracture of first cervical vertebra, sequela: Secondary | ICD-10-CM

## 2014-06-11 DIAGNOSIS — R269 Unspecified abnormalities of gait and mobility: Secondary | ICD-10-CM

## 2014-06-11 NOTE — Progress Notes (Signed)
Patient ID: Monica Waller, female   DOB: [birth year], 81 y.o.   MRN: [id number]    Code Status: DNR  No Known Allergies  Chief Complaint  Patient presents with  . Medical Management of Chronic Issues    HPI: Patient is a 81 y.o. female seen in the SNF at [facility] today for evaluation of chronic medical conditions.    01/27/2014-01/29/2014 Right rib fracture Pneumothorax C1 right sided impaction fracture. She was found to have a right sided rib fracture with a small PTX. A follow up chest x ray showed a resolved PTX. NSU was also consulted for a questionable odontoid fracture, after review Dr. Ellene Route felt this was a stable C1 right sided impaction fracture which does not require any specific instruction   6/5/15ED evaluation for left forehead/left popliteal laceration-healed.    Problem List Items Addressed This Visit    Senile degeneration of brain - Primary    Noted on CT and MRI of the brain 10/14/09       Right rib fracture    Healed.       Progressive supranuclear palsy    Frequent falling. 03/22/14 Neurology: f/u 6 mos. May ST MBS if swallow became a significant problem.          Gait disorder    Started June 2011. Patient has cerebral atrophy, cerebrovascular disease, and supranuclear palsy. 03/26/14 last fall was 03/25/14 w/o apparent injury.  --supervision, w/c, SNF      C1 cervical fracture    stable         Review of Systems:  Review of Systems  Constitutional: Negative for fever, chills, weight loss, malaise/fatigue and diaphoresis.  HENT: Positive for hearing loss. Negative for congestion, ear discharge, ear pain, nosebleeds, sore throat and tinnitus.   Eyes: Negative for blurred vision, double vision, photophobia, pain, discharge and redness.  Respiratory: Negative for cough, hemoptysis, sputum production, shortness of breath, wheezing and stridor.   Cardiovascular: Negative for chest pain, palpitations, orthopnea, claudication, leg swelling and  PND.  Gastrointestinal: Negative for heartburn, nausea, vomiting, abdominal pain, diarrhea, constipation, blood in stool and melena.  Genitourinary: Positive for frequency. Negative for dysuria, urgency, hematuria and flank pain.  Musculoskeletal: Positive for falls. Negative for myalgias, back pain, joint pain and neck pain.  Skin: Negative for itching and rash.  Neurological: Negative for dizziness, tingling, tremors, sensory change, speech change, focal weakness, seizures, loss of consciousness, weakness and headaches.       Poor balance and coordination.   Endo/Heme/Allergies: Negative for environmental allergies and polydipsia. Does not bruise/bleed easily.  Psychiatric/Behavioral: Positive for memory loss. Negative for depression, suicidal ideas, hallucinations and substance abuse. The patient is not nervous/anxious and does not have insomnia.      Past Medical History  Diagnosis Date  . Cerebral degeneration   . Ataxia   . Gait disorder   . Progressive supranuclear palsy   . Subdural hematoma     Hx. of a left frontal  . 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 Other generalized ischemic cerebrovascular disease 10/14/09    Noted on CT and MR brain 10/14/09  . Dysphagia, oropharyngeal phase   . Cancer     Breast  . Malignant neoplasm of breast (female), unspecified site   . Central retinal artery occlusion 12/23/2013    Left eye  . Supranuclear palsy    Past Surgical History  Procedure Laterality Date  .  Mastectomy Right 1998    Dr. Rebekah Chesterfield  . Tonsillectomy    . Cataract extraction, bilateral     Social History:   reports that she quit smoking about 49 years ago. Her smoking use included Cigarettes. She has never used smokeless tobacco. She reports that she does not drink alcohol or use illicit drugs.  Family History  Problem Relation Age of Onset  . Other Mother     "Old Age"  . Cancer Father   . Cancer Brother     Colon     Medications: Patient's Medications  New Prescriptions   No medications on file  Previous Medications   ACETAMINOPHEN (TYLENOL) 325 MG TABLET    Take 2 tablets (650 mg total) by mouth every 6 (six) hours as needed.   ASPIRIN 81 MG TABLET    Take 81 mg by mouth daily.   CALCIUM CARB-CHOLECALCIFEROL (CALTRATE 600+D) 600-800 MG-UNIT TABS    Take 1 tablet by mouth.   MAGNESIUM HYDROXIDE (MILK OF MAGNESIA) 400 MG/5ML SUSPENSION    Take 30 mLs by mouth daily as needed for mild constipation.   MULTIVITAMIN-IRON-MINERALS-FOLIC ACID (CENTRUM) CHEWABLE TABLET    Chew 1 tablet by mouth daily.   POLYVINYL ALCOHOL (ARTIFICIAL TEARS) 1.4 % OPHTHALMIC SOLUTION    1 drop as needed for dry eyes. *wait 2-5 minutes between eye drops , twice daily   SENNA (SENOKOT) 8.6 MG TABLET    Take 1 tablet by mouth 2 (two) times daily.  Modified Medications   No medications on file  Discontinued Medications   No medications on file     Physical Exam: Physical Exam  Constitutional: She is oriented to person, place, and time. She appears well-developed and well-nourished. No distress.  HENT:  Head: Normocephalic.  Right Ear: External ear normal.  Left Ear: External ear normal.  Nose: Nose normal.  Mouth/Throat: Oropharynx is clear and moist. No oropharyngeal exudate.  Eyes: Conjunctivae are normal. Right eye exhibits no discharge. Left eye exhibits no discharge. No scleral icterus.  Difficulty looking down followed by an upgaze ?palsy  Neck: Normal range of motion. Neck supple. No JVD present. No tracheal deviation present. No thyromegaly present.  Cardiovascular: Normal rate, regular rhythm, normal heart sounds and intact distal pulses.   No murmur heard. Pulmonary/Chest: Effort normal and breath sounds normal. No stridor. No respiratory distress. She has no wheezes. She has no rales. She exhibits no tenderness.  Abdominal: Soft. Bowel sounds are normal. She exhibits no distension. There is no tenderness.  There is no rebound and no guarding.  Musculoskeletal: Normal range of motion. She exhibits no edema or tenderness.  Lymphadenopathy:    She has no cervical adenopathy.  Neurological: She is alert and oriented to person, place, and time. She displays normal reflexes. A cranial nerve deficit is present. She exhibits normal muscle tone. Coordination abnormal.  Skin: Skin is warm and dry. No rash noted. She is not diaphoretic. No erythema. No pallor.     Psychiatric: She has a normal mood and affect. Her speech is normal and behavior is normal. Judgment and thought content normal. Cognition and memory are impaired. She does not express impulsivity or inappropriate judgment. She exhibits abnormal recent memory. She exhibits normal remote memory.    Filed Vitals:   06/11/14 1556  BP: 113/58  Pulse: 63  Temp: 96.8 F (36 C)  TempSrc: Tympanic  Resp: 20    Labs reviewed: Basic Metabolic Panel:  Recent Labs  01/27/14 1647  NA 138  K 4.0  CL 103  CO2 21  GLUCOSE 93  BUN 26*  CREATININE 0.81  CALCIUM 9.5   CBC:  Recent Labs  01/27/14 1647 01/28/14 0500  WBC 8.1 6.6  HGB 13.4 13.3  HCT 40.3 38.8  MCV 92.0 92.8  PLT 273 241   Past Procedures:  01/16/13 CT head Wo Contrast:   IMPRESSION:  Atrophy and chronic microvascular ischemia. No acute abnormality   09/25/13 CT head, cervical spine, maxillary bones:   IMPRESSION:  No acute intracranial abnormality. Chronic and involutional changes  appreciated.  Left periorbital hematoma. No evidence of acute fracture nor  dislocation. Findings which may represent sinus disease within the  ethmoid air cells.  Multilevel spondylosis within the cervical spine without acute  osseous abnormalities.  01/27/14 CXR  IMPRESSION:  Displaced right posterior seventh rib fracture with right apical  pneumothorax. Right base infiltrate. Underlying emphysema. Left lung  clear.  01/27/14 CT head and cervical spine:  IMPRESSION:  CT  head: Diffuse opacification of most of the left mastoid air cells  as well as much of the left external auditory canal and essentially  the entire left middle ear region. Question traumatic versus  inflammatory etiology. No fracture or air-fluid level is seen in  this region. Mastoids and external auditory canal/middle ear on the  right are clear.  Atrophy with patchy small vessel disease, stable. No intracranial  mass, hemorrhage, or extra-axial fluid. There is a left frontal  scalp hematoma.  CT cervical spine: Incomplete fracture of the inferior aspect of the  odontoid, type III. Alignment anatomic. No other fractures.  Extensive arthropathy. Areas of spondylolisthesis appear stable  under felt to be due to underlying spondylosis. There is a sizable  free-flowing right pleural effusion.  01/28/14 CXR  IMPRESSION:  No or pneumothorax.    Assessment/Plan Senile degeneration of brain Noted on CT and MRI of the brain 10/14/09    Right rib fracture Healed.    Progressive supranuclear palsy Frequent falling. 03/22/14 Neurology: f/u 6 mos. May ST MBS if swallow became a significant problem.       Gait disorder Started June 2011. Patient has cerebral atrophy, cerebrovascular disease, and supranuclear palsy. 03/26/14 last fall was 03/25/14 w/o apparent injury.  --supervision, w/c, SNF   C1 cervical fracture stable     Family/ Staff Communication: observe the patient.   Goals of Care: SNF  Labs/tests ordered: none

## 2014-06-11 NOTE — Assessment & Plan Note (Signed)
Started June 2011. Patient has cerebral atrophy, cerebrovascular disease, and supranuclear palsy. 03/26/14 last fall was 03/25/14 w/o apparent injury.  --supervision, w/c, SNF

## 2014-06-11 NOTE — Assessment & Plan Note (Signed)
Frequent falling. 03/22/14 Neurology: f/u 6 mos. May ST MBS if swallow became a significant problem.

## 2014-06-11 NOTE — Assessment & Plan Note (Signed)
Noted on CT and MRI of the brain 10/14/09

## 2014-06-11 NOTE — Assessment & Plan Note (Signed)
Healed

## 2014-06-11 NOTE — Assessment & Plan Note (Signed)
stable °

## 2014-07-09 ENCOUNTER — Non-Acute Institutional Stay (SKILLED_NURSING_FACILITY): Payer: Medicare Other | Admitting: Internal Medicine

## 2014-07-09 ENCOUNTER — Encounter: Payer: Self-pay | Admitting: Internal Medicine

## 2014-07-09 DIAGNOSIS — G231 Progressive supranuclear ophthalmoplegia [Steele-Richardson-Olszewski]: Secondary | ICD-10-CM

## 2014-07-09 DIAGNOSIS — R1312 Dysphagia, oropharyngeal phase: Secondary | ICD-10-CM | POA: Diagnosis not present

## 2014-07-09 DIAGNOSIS — S12000S Unspecified displaced fracture of first cervical vertebra, sequela: Secondary | ICD-10-CM

## 2014-07-09 NOTE — Progress Notes (Signed)
Patient ID: Monica Waller, female   DOB: [birth year], 81 y.o.   MRN: [id number]    [place] Room Number: Y19  Place of Service: SNF (31)    No Known Allergies  Chief Complaint  Patient presents with  . Medical Management of Chronic Issues    HPI:   Progressive supranuclear palsy: Unchanged. Does not seem emotionally labile. Clearly has speech disorders and ataxic movements and discoordination related to this problem. Continues to be followed by neurologist, Dr. Jannifer Waller.  Dysphagia, oropharyngeal phase: Although previous studies have shown a dysphagia, she says that she is doing "okay" for the present time.  C1 cervical fracture, sequela: Patient is out of brace now for several months and she is doing well. She has pain up and down the spine from time to time but generally the discomfort is well controlled.    Medications: Patient's Medications  New Prescriptions   No medications on file  Previous Medications   ACETAMINOPHEN (TYLENOL) 325 MG TABLET    Take 2 tablets (650 mg total) by mouth every 6 (six) hours as needed.   ASPIRIN 81 MG TABLET    Take 81 mg by mouth daily.   CALCIUM CARB-CHOLECALCIFEROL (CALTRATE 600+D) 600-800 MG-UNIT TABS    Take 1 tablet by mouth.   MAGNESIUM HYDROXIDE (MILK OF MAGNESIA) 400 MG/5ML SUSPENSION    Take 30 mLs by mouth daily as needed for mild constipation.   MULTIVITAMIN-IRON-MINERALS-FOLIC ACID (CENTRUM) CHEWABLE TABLET    Chew 1 tablet by mouth daily.   POLYVINYL ALCOHOL (ARTIFICIAL TEARS) 1.4 % OPHTHALMIC SOLUTION    1 drop as needed for dry eyes. *wait 2-5 minutes between eye drops , twice daily   SENNA (SENOKOT) 8.6 MG TABLET    Take 1 tablet by mouth 2 (two) times daily.  Modified Medications   No medications on file  Discontinued Medications   No medications on file     Review of Systems  Constitutional: Positive for fatigue.  HENT: Positive for hearing loss. Negative for ear pain.   Eyes: Positive for  visual disturbance (Corrective lenses).  Respiratory: Negative.   Cardiovascular: Negative for chest pain, palpitations and leg swelling.  Gastrointestinal: Negative.   Endocrine: Negative for cold intolerance, heat intolerance, polydipsia and polyphagia.  Genitourinary: Negative.   Musculoskeletal: Positive for gait problem.       Unstable gait. History of multiple falls.  Skin:       History right mastectomy for breast cancer  Neurological:       Progressive supranuclear palsy. Ataxia. Unstable gait. Clumsy right leg and foot.  Hematological: Negative.   Psychiatric/Behavioral: Negative.     Filed Vitals:   07/09/14 1335  BP: 140/76  Pulse: 70  Temp: 98.1 F (36.7 C)  Resp: 20  Height: _0  (1.651 m)  Weight: 129 lb (58.514 kg)  SpO2: 97%   Body mass index is 21.47 kg/(m^2).  Physical Exam  Constitutional: She is oriented to person, place, and time. She appears well-developed and well-nourished. No distress.  HENT:  Head: Normocephalic.  Right Ear: External ear normal.  Left Ear: External ear normal.  Nose: Nose normal.  Mouth/Throat: Oropharynx is clear and moist. No oropharyngeal exudate.  Eyes: Conjunctivae are normal. Right eye exhibits no discharge. Left eye exhibits no discharge. No scleral icterus.  Difficulty looking down followed by an upgaze ?palsy  Neck: Normal range of motion. Neck supple. No JVD present. No tracheal deviation present. No thyromegaly present.  Cardiovascular: Normal  rate, regular rhythm, normal heart sounds and intact distal pulses.   No murmur heard. Pulmonary/Chest: Effort normal and breath sounds normal. No stridor. No respiratory distress. She has no wheezes. She has no rales. She exhibits no tenderness.  Abdominal: Soft. Bowel sounds are normal. She exhibits no distension. There is no tenderness. There is no rebound and no guarding.  Musculoskeletal: Normal range of motion. She exhibits no edema or tenderness.  Lymphadenopathy:     She has no cervical adenopathy.  Neurological: She is alert and oriented to person, place, and time. She displays normal reflexes. No cranial nerve deficit. She exhibits normal muscle tone. Coordination abnormal.  Skin: Skin is warm and dry. No rash noted. She is not diaphoretic. No erythema. No pallor.  Psychiatric: She has a normal mood and affect. Her speech is normal and behavior is normal. Judgment and thought content normal. Cognition and memory are impaired. She does not express impulsivity or inappropriate judgment. She exhibits abnormal recent memory. She exhibits normal remote memory.     Labs reviewed: No visits with results within 3 Month(s) from this visit. Latest known visit with results is:  Admission on 01/27/2014, Discharged on 01/29/2014  Component Date Value Ref Range Status  . WBC 01/27/2014 8.1  4.0 - 10.5 K/uL Final  . RBC 01/27/2014 4.38  3.87 - 5.11 MIL/uL Final  . Hemoglobin 01/27/2014 13.4  12.0 - 15.0 g/dL Final  . HCT 01/27/2014 40.3  36.0 - 46.0 % Final  . MCV 01/27/2014 92.0  78.0 - 100.0 fL Final  . MCH 01/27/2014 30.6  26.0 - 34.0 pg Final  . MCHC 01/27/2014 33.3  30.0 - 36.0 g/dL Final  . RDW 01/27/2014 12.7  11.5 - 15.5 % Final  . Platelets 01/27/2014 273  150 - 400 K/uL Final  . Sodium 01/27/2014 138  137 - 147 mEq/L Final  . Potassium 01/27/2014 4.0  3.7 - 5.3 mEq/L Final  . Chloride 01/27/2014 103  96 - 112 mEq/L Final  . CO2 01/27/2014 21  19 - 32 mEq/L Final  . Glucose, Bld 01/27/2014 93  70 - 99 mg/dL Final  . BUN 01/27/2014 26* 6 - 23 mg/dL Final  . Creatinine, Ser 01/27/2014 0.81  0.50 - 1.10 mg/dL Final  . Calcium 01/27/2014 9.5  8.4 - 10.5 mg/dL Final  . GFR calc non Af Amer 01/27/2014 63* >90 mL/min Final  . GFR calc Af Amer 01/27/2014 73* >90 mL/min Final   Comment: (NOTE)                          The eGFR has been calculated using the CKD EPI equation.                          This calculation has not been validated in all clinical  situations.                          eGFR's persistently <90 mL/min signify possible Chronic Kidney                          Disease.  . Anion gap 01/27/2014 14  5 - 15 Final  . WBC 01/28/2014 6.6  4.0 - 10.5 K/uL Final  . RBC 01/28/2014 4.18  3.87 - 5.11 MIL/uL Final  . Hemoglobin 01/28/2014 13.3  12.0 - 15.0 g/dL Final  .  HCT 01/28/2014 38.8  36.0 - 46.0 % Final  . MCV 01/28/2014 92.8  78.0 - 100.0 fL Final  . MCH 01/28/2014 31.8  26.0 - 34.0 pg Final  . MCHC 01/28/2014 34.3  30.0 - 36.0 g/dL Final  . RDW 01/28/2014 13.0  11.5 - 15.5 % Final  . Platelets 01/28/2014 241  150 - 400 K/uL Final  . MRSA by PCR 01/28/2014 NEGATIVE  NEGATIVE Final   Comment:                                 The GeneXpert MRSA Assay (FDA                          approved for NASAL specimens                          only), is one component of a                          comprehensive MRSA colonization                          surveillance program. It is not                          intended to diagnose MRSA                          infection nor to guide or                          monitor treatment for                          MRSA infections.     Assessment/Plan  1. Progressive supranuclear palsy Stable  2. Dysphagia, oropharyngeal phase Stable  3. C1 cervical fracture, sequela Resolved

## 2014-07-09 NOTE — Progress Notes (Signed)
Patient ID: Monica Waller, female   DOB: Jan 03, 1924, 81 y.o.   MRN: [id number]    HISTORY AND PHYSICAL  Location:  [place] Room Number: N 18 Place of Service: SNF (31)   Extended Emergency Contact Information Primary Emergency Contact: Smith,Mary Nell Address: [address], [place] Phone: [phone] Relation: Neighbor Secondary Emergency Contact: Laplant,Jane          [place] Home Phone: [phone] Relation: Daughter  Advanced Directive information Does patient have an advance directive?: Yes (She is a body donor to [employer]. Form enacted 04/01/1974 and a copy is in the chart at [facility]), Type of Advance Directive: Out of facility DNR (pink MOST or yellow form), Does patient want to make changes to advanced directive?: No - Patient declined  Chief Complaint  Patient presents with  . New Admit To SNF    HPI:  Patient was hospitalized from 01/27/2014 through 01/29/2014. She fell and had a right seventh rib fracture with dislocation. This was complicated by pneumothorax, which seemed resolved by 01/28/2014, and a C1 right sided impaction fracture of the spine. MRI of the brain also showed findings of cerebral atrophy and small vessel disease. CT of cervical spine showed marked disc space narrowing at C4-5, C5-6, and C6-7. There was multilevel facet arthropathy with greater severity on the right.  Patient was seen in consultation by Dr. Ellene Route, neurosurgeon and Dr. Jannifer Franklin, neurologist. She was felt stable enough to return to [facility] skilled nursing facility on 01/29/2014.  Patient is here for stabilization, assistance in care, rehabilitative services with physical therapy and occupational therapy, and strengthening.  Past Medical History  Diagnosis Date  . Cerebral degeneration   . Ataxia   . Gait disorder   . Progressive  supranuclear palsy   . Subdural hematoma     Hx. of a left frontal  . 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 Other generalized ischemic cerebrovascular disease 10/14/09    Noted on CT and MR brain 10/14/09  . Dysphagia, oropharyngeal phase   . Cancer     Breast  . Malignant neoplasm of breast (female), unspecified site   . Central retinal artery occlusion 12/23/2013    Left eye  . Supranuclear palsy     Past Surgical History  Procedure Laterality Date  . Mastectomy Right 1998    Dr. Rebekah Chesterfield  . Tonsillectomy    . Cataract extraction, bilateral      Patient Care Team: Estill Dooms, MD as PCP - General (Internal Medicine) Man Mast X, NP as Nurse Practitioner (Nurse Practitioner) [facility] Kathrynn Ducking, MD as Consulting Physician (Neurology) Luberta Mutter, MD as Consulting Physician (Ophthalmology) Kristeen Miss, MD as Consulting Physician (Neurosurgery)  History   Social History  . Marital Status: Widowed    Spouse Name: N/A  . Number of Children: N/A  . Years of Education: N/A   Occupational History  . retired Quarry manager    Social History Main Topics  . Smoking status: Former Smoker    Types: Cigarettes    Quit date: 05/04/1965  . Smokeless tobacco: Never Used  . Alcohol Use: No     Comment: Quit drinking  . Drug Use: No  . Sexual Activity: No  Other Topics Concern  . Not on file   Social History Narrative   Lives at [facility]   Widowed   No children; one step daughter Willard Madrigal   Stopped smoking 1967   Alcohol none   Wheelchair   Exercise none   DNR, MOST, Living Will, POA     reports that she quit smoking about 49 years ago. Her smoking use included Cigarettes. She has never used smokeless tobacco. She reports that she does not drink alcohol or use illicit drugs.  Family History  Problem Relation Age of Onset  . Other Mother     "Old Age"  . Cancer Father   . Cancer Brother      Colon   Family Status  Relation Status Death Age  . Mother Deceased 5  . Father Deceased 21  . Brother Deceased   . Sister Alive     Immunization History  Administered Date(s) Administered  . Influenza Whole 02/09/2013  . Influenza-Unspecified 01/26/2014  . Tdap 11/04/2012    No Known Allergies  Medications: Patient's Medications  New Prescriptions   No medications on file  Previous Medications   ACETAMINOPHEN (TYLENOL) 325 MG TABLET    Take 2 tablets (650 mg total) by mouth every 6 (six) hours as needed.   ASPIRIN 81 MG TABLET    Take 81 mg by mouth daily.   CALCIUM CARB-CHOLECALCIFEROL (CALTRATE 600+D) 600-800 MG-UNIT TABS    Take 1 tablet by mouth.   MAGNESIUM HYDROXIDE (MILK OF MAGNESIA) 400 MG/5ML SUSPENSION    Take 30 mLs by mouth daily as needed for mild constipation.   MULTIVITAMIN-IRON-MINERALS-FOLIC ACID (CENTRUM) CHEWABLE TABLET    Chew 1 tablet by mouth daily.   POLYVINYL ALCOHOL (ARTIFICIAL TEARS) 1.4 % OPHTHALMIC SOLUTION    1 drop as needed for dry eyes. *wait 2-5 minutes between eye drops , twice daily   SENNA (SENOKOT) 8.6 MG TABLET    Take 1 tablet by mouth 2 (two) times daily.  Modified Medications   No medications on file  Discontinued Medications   No medications on file    Review of Systems  Constitutional: Positive for fatigue.  HENT: Positive for hearing loss. Negative for ear pain.   Eyes: Positive for visual disturbance (Corrective lenses).  Respiratory: Negative.   Cardiovascular: Negative for chest pain, palpitations and leg swelling.  Gastrointestinal: Negative.   Endocrine: Negative for cold intolerance, heat intolerance, polydipsia and polyphagia.  Genitourinary: Negative.   Musculoskeletal: Positive for gait problem.       Unstable gait. History of multiple falls.  Skin:       History right mastectomy for breast cancer  Neurological:       Progressive supranuclear palsy. Ataxia. Unstable gait. Clumsy right leg and foot.    Hematological: Negative.   Psychiatric/Behavioral: Negative.     Filed Vitals:   02/11/14 1315  BP: 124/66  Pulse: 70  Temp: 97 F (36.1 C)  Resp: 18  Height: 5' 5"  (1.651 m)  Weight: 123 lb (55.792 kg)  SpO2: 97%   Body mass index is 20.47 kg/(m^2).  Physical Exam  Constitutional: She is oriented to person, place, and time. She appears well-developed and well-nourished. No distress.  HENT:  Head: Normocephalic.  Right Ear: External ear normal.  Left Ear: External ear normal.  Nose: Nose normal.  Mouth/Throat: Oropharynx is clear and moist. No oropharyngeal exudate.  Eyes: Conjunctivae are normal. Right eye exhibits no discharge. Left eye exhibits no discharge. No scleral  icterus.  Difficulty looking down followed by an upgaze ?palsy  Neck: Normal range of motion. Neck supple. No JVD present. No tracheal deviation present. No thyromegaly present.  Cardiovascular: Normal rate, regular rhythm, normal heart sounds and intact distal pulses.   No murmur heard. Pulmonary/Chest: Effort normal and breath sounds normal. No stridor. No respiratory distress. She has no wheezes. She has no rales. She exhibits no tenderness.  Abdominal: Soft. Bowel sounds are normal. She exhibits no distension. There is no tenderness. There is no rebound and no guarding.  Musculoskeletal: Normal range of motion. She exhibits no edema or tenderness.  Tenderness at the back and a soft cervical collar.  Lymphadenopathy:    She has no cervical adenopathy.  Neurological: She is alert and oriented to person, place, and time. She displays normal reflexes. No cranial nerve deficit. She exhibits normal muscle tone. Coordination abnormal.  Skin: Skin is warm and dry. No rash noted. She is not diaphoretic. No erythema. No pallor.  Left facial ecchymoses.  Psychiatric: She has a normal mood and affect. Her speech is normal and behavior is normal. Judgment and thought content normal. Cognition and memory are impaired.  She does not express impulsivity or inappropriate judgment. She exhibits abnormal recent memory. She exhibits normal remote memory.     Labs reviewed: Admission on 01/27/2014, Discharged on 01/29/2014  Component Date Value Ref Range Status  . WBC 01/27/2014 8.1  4.0 - 10.5 K/uL Final  . RBC 01/27/2014 4.38  3.87 - 5.11 MIL/uL Final  . Hemoglobin 01/27/2014 13.4  12.0 - 15.0 g/dL Final  . HCT 01/27/2014 40.3  36.0 - 46.0 % Final  . MCV 01/27/2014 92.0  78.0 - 100.0 fL Final  . MCH 01/27/2014 30.6  26.0 - 34.0 pg Final  . MCHC 01/27/2014 33.3  30.0 - 36.0 g/dL Final  . RDW 01/27/2014 12.7  11.5 - 15.5 % Final  . Platelets 01/27/2014 273  150 - 400 K/uL Final  . Sodium 01/27/2014 138  137 - 147 mEq/L Final  . Potassium 01/27/2014 4.0  3.7 - 5.3 mEq/L Final  . Chloride 01/27/2014 103  96 - 112 mEq/L Final  . CO2 01/27/2014 21  19 - 32 mEq/L Final  . Glucose, Bld 01/27/2014 93  70 - 99 mg/dL Final  . BUN 01/27/2014 26* 6 - 23 mg/dL Final  . Creatinine, Ser 01/27/2014 0.81  0.50 - 1.10 mg/dL Final  . Calcium 01/27/2014 9.5  8.4 - 10.5 mg/dL Final  . GFR calc non Af Amer 01/27/2014 63* >90 mL/min Final  . GFR calc Af Amer 01/27/2014 73* >90 mL/min Final   Comment: (NOTE)                          The eGFR has been calculated using the CKD EPI equation.                          This calculation has not been validated in all clinical situations.                          eGFR's persistently <90 mL/min signify possible Chronic Kidney                          Disease.  . Anion gap 01/27/2014 14  5 - 15 Final  . WBC 01/28/2014 6.6  4.0 -  10.5 K/uL Final  . RBC 01/28/2014 4.18  3.87 - 5.11 MIL/uL Final  . Hemoglobin 01/28/2014 13.3  12.0 - 15.0 g/dL Final  . HCT 01/28/2014 38.8  36.0 - 46.0 % Final  . MCV 01/28/2014 92.8  78.0 - 100.0 fL Final  . MCH 01/28/2014 31.8  26.0 - 34.0 pg Final  . MCHC 01/28/2014 34.3  30.0 - 36.0 g/dL Final  . RDW 01/28/2014 13.0  11.5 - 15.5 % Final  . Platelets  01/28/2014 241  150 - 400 K/uL Final  . MRSA by PCR 01/28/2014 NEGATIVE  NEGATIVE Final   Comment:                                 The GeneXpert MRSA Assay (FDA                          approved for NASAL specimens                          only), is one component of a                          comprehensive MRSA colonization                          surveillance program. It is not                          intended to diagnose MRSA                          infection nor to guide or                          monitor treatment for                          MRSA infections.  Office Visit on 12/23/2013  Component Date Value Ref Range Status  . Sed Rate 12/23/2013 7  0 - 40 mm/hr Final    No results found.   Assessment/Plan  1. Progressive supranuclear palsy Stable and followed by Dr. Jannifer Franklin  2. Dysphagia, oropharyngeal phase Although she has documented dysphagia, she says she is doing "okay"  3. Right rib fracture, sequela Mild tenderness on the right chest wall.  4. Pneumothorax Breath sounds bilaterally  5. Gait disorder High risk for additional falls  6. Hematoma Hematoma of the forehead is resolving.

## 2014-07-13 ENCOUNTER — Non-Acute Institutional Stay (SKILLED_NURSING_FACILITY): Payer: Medicare Other | Admitting: Nurse Practitioner

## 2014-07-13 ENCOUNTER — Encounter: Payer: Self-pay | Admitting: Nurse Practitioner

## 2014-07-13 DIAGNOSIS — R1312 Dysphagia, oropharyngeal phase: Secondary | ICD-10-CM | POA: Diagnosis not present

## 2014-07-13 DIAGNOSIS — R269 Unspecified abnormalities of gait and mobility: Secondary | ICD-10-CM | POA: Diagnosis not present

## 2014-07-13 DIAGNOSIS — G311 Senile degeneration of brain, not elsewhere classified: Secondary | ICD-10-CM

## 2014-07-13 DIAGNOSIS — G231 Progressive supranuclear ophthalmoplegia [Steele-Richardson-Olszewski]: Secondary | ICD-10-CM | POA: Diagnosis not present

## 2014-07-13 DIAGNOSIS — S0511XA Contusion of eyeball and orbital tissues, right eye, initial encounter: Secondary | ICD-10-CM | POA: Diagnosis not present

## 2014-07-13 NOTE — Assessment & Plan Note (Signed)
Frequent falling. 03/22/14 Neurology: f/u 6 mos. May ST MBS if swallow became a significant problem.

## 2014-07-13 NOTE — Assessment & Plan Note (Signed)
Sustained from a fall-no change of vision

## 2014-07-13 NOTE — Assessment & Plan Note (Signed)
Noted on CT and MRI of the brain 10/14/09

## 2014-07-13 NOTE — Progress Notes (Signed)
Patient ID: Monica Waller, female   DOB: 07/03/1923, 81 y.o.   MRN: [id number]   Code Status: DNR  No Known Allergies  Chief Complaint  Patient presents with  . Medical Management of Chronic Issues  . Acute Visit    R peri-orbital bruise    HPI: Patient is a 81 y.o. female seen in the SNF at [facility] today for evaluation of right orbital contusion sustained from falling and other chronic medical conditions.  Problem List Items Addressed This Visit    Progressive supranuclear palsy (Chronic)    Frequent falling. 03/22/14 Neurology: f/u 6 mos. May ST [facility] if swallow became a significant problem.         Dysphagia, oropharyngeal phase (Chronic)    Modify diet. Risk for aspiration.        Senile degeneration of brain    Noted on CT and MRI of the brain 10/14/09       Gait disorder    Started June 2011. Patient has cerebral atrophy, cerebrovascular disease, and supranuclear palsy. Frequent falling.       Contusion of right orbital tissues - Primary    Sustained from a fall-no change of vision          Review of Systems:  Review of Systems  Constitutional: Positive for fatigue.  HENT: Positive for hearing loss. Negative for ear pain.   Eyes: Positive for visual disturbance (Corrective lenses).  Respiratory: Negative.   Cardiovascular: Negative for chest pain, palpitations and leg swelling.  Gastrointestinal: Negative.   Endocrine: Negative for cold intolerance, heat intolerance, polydipsia and polyphagia.  Genitourinary: Negative.   Musculoskeletal: Positive for gait problem.       Unstable gait. History of multiple falls.  Skin:       History right mastectomy for breast cancer R peri-orbital bruise.   Neurological:       Progressive supranuclear palsy. Ataxia. Unstable gait. Clumsy right leg and foot.  Hematological: Negative.   Psychiatric/Behavioral: Negative.      Past Medical History  Diagnosis Date  . Cerebral degeneration   . Ataxia   .  Gait disorder   . Progressive supranuclear palsy   . Subdural hematoma     Hx. of a left frontal  . 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 Other generalized ischemic cerebrovascular disease 10/14/09    Noted on CT and MR brain 10/14/09  . Dysphagia, oropharyngeal phase   . Cancer     Breast  . Malignant neoplasm of breast (female), unspecified site   . Central retinal artery occlusion 12/23/2013    Left eye  . Supranuclear palsy    Past Surgical History  Procedure Laterality Date  . Mastectomy Right 1998    Dr. Rebekah Chesterfield  . Tonsillectomy    . Cataract extraction, bilateral     Social History:   reports that she quit smoking about 49 years ago. Her smoking use included Cigarettes. She has never used smokeless tobacco. She reports that she does not drink alcohol or use illicit drugs.  Family History  Problem Relation Age of Onset  . Other Mother     "Old Age"  . Cancer Father   . Cancer Brother     Colon    Medications: Patient's Medications  New Prescriptions   No medications on file  Previous Medications   ACETAMINOPHEN (TYLENOL) 325 MG TABLET    Take 2 tablets (650 mg total)  by mouth every 6 (six) hours as needed.   ASPIRIN 81 MG TABLET    Take 81 mg by mouth daily.   CALCIUM CARB-CHOLECALCIFEROL (CALTRATE 600+D) 600-800 MG-UNIT TABS    Take 1 tablet by mouth.   MAGNESIUM HYDROXIDE (MILK OF MAGNESIA) 400 MG/5ML SUSPENSION    Take 30 mLs by mouth daily as needed for mild constipation.   MULTIVITAMIN-IRON-MINERALS-FOLIC ACID (CENTRUM) CHEWABLE TABLET    Chew 1 tablet by mouth daily.   POLYVINYL ALCOHOL (ARTIFICIAL TEARS) 1.4 % OPHTHALMIC SOLUTION    1 drop as needed for dry eyes. *wait 2-5 minutes between eye drops , twice daily   SENNA (SENOKOT) 8.6 MG TABLET    Take 1 tablet by mouth 2 (two) times daily.  Modified Medications   No medications on file  Discontinued Medications   No medications on file     Physical  Exam: Physical Exam  Constitutional: She is oriented to person, place, and time. She appears well-developed and well-nourished. No distress.  HENT:  Head: Normocephalic.  Right Ear: External ear normal.  Left Ear: External ear normal.  Nose: Nose normal.  Mouth/Throat: Oropharynx is clear and moist. No oropharyngeal exudate.  Eyes: Conjunctivae are normal. Right eye exhibits no discharge. Left eye exhibits no discharge. No scleral icterus.  Difficulty looking down followed by an upgaze ?palsy  Neck: Normal range of motion. Neck supple. No JVD present. No tracheal deviation present. No thyromegaly present.  Cardiovascular: Normal rate, regular rhythm, normal heart sounds and intact distal pulses.   No murmur heard. Pulmonary/Chest: Effort normal and breath sounds normal. No stridor. No respiratory distress. She has no wheezes. She has no rales. She exhibits no tenderness.  Abdominal: Soft. Bowel sounds are normal. She exhibits no distension. There is no tenderness. There is no rebound and no guarding.  Musculoskeletal: Normal range of motion. She exhibits no edema or tenderness.  Lymphadenopathy:    She has no cervical adenopathy.  Neurological: She is alert and oriented to person, place, and time. She displays normal reflexes. No cranial nerve deficit. She exhibits normal muscle tone. Coordination abnormal.  Skin: Skin is warm and dry. No rash noted. She is not diaphoretic. No erythema. No pallor.  Ecchymosis R peri-orbital area-no vision changes   Psychiatric: She has a normal mood and affect. Her speech is normal and behavior is normal. Judgment and thought content normal. Cognition and memory are impaired. She does not express impulsivity or inappropriate judgment. She exhibits abnormal recent memory. She exhibits normal remote memory.   Filed Vitals:   07/13/14 1444  BP: 138/64  Pulse: 64  Temp: 97.4 F (36.3 C)  TempSrc: Tympanic  Resp: 18      Labs reviewed: Basic Metabolic  Panel:  Recent Labs  01/27/14 1647  NA 138  K 4.0  CL 103  CO2 21  GLUCOSE 93  BUN 26*  CREATININE 0.81  CALCIUM 9.5   Liver Function Tests: No results for input(s): AST, ALT, ALKPHOS, BILITOT, PROT, ALBUMIN in the last 8760 hours. No results for input(s): LIPASE, AMYLASE in the last 8760 hours. No results for input(s): AMMONIA in the last 8760 hours. CBC:  Recent Labs  01/27/14 1647 01/28/14 0500  WBC 8.1 6.6  HGB 13.4 13.3  HCT 40.3 38.8  MCV 92.0 92.8  PLT 273 241   Lipid Panel: No results for input(s): CHOL, HDL, LDLCALC, TRIG, CHOLHDL, LDLDIRECT in the last 8760 hours.  Past Procedures:01/16/13 CT head Wo Contrast:   IMPRESSION:  Atrophy  and chronic microvascular ischemia. No acute abnormality   09/25/13 CT head, cervical spine, maxillary bones:   IMPRESSION:  No acute intracranial abnormality. Chronic and involutional changes  appreciated.  Left periorbital hematoma. No evidence of acute fracture nor  dislocation. Findings which may represent sinus disease within the  ethmoid air cells.  Multilevel spondylosis within the cervical spine without acute  osseous abnormalities.  01/27/14 CXR  IMPRESSION:  Displaced right posterior seventh rib fracture with right apical  pneumothorax. Right base infiltrate. Underlying emphysema. Left lung  clear.  01/27/14 CT head and cervical spine:  IMPRESSION:  CT head: Diffuse opacification of most of the left mastoid air cells  as well as much of the left external auditory canal and essentially  the entire left middle ear region. Question traumatic versus  inflammatory etiology. No fracture or air-fluid level is seen in  this region. Mastoids and external auditory canal/middle ear on the  right are clear.  Atrophy with patchy small vessel disease, stable. No intracranial  mass, hemorrhage, or extra-axial fluid. There is a left frontal  scalp hematoma.  CT cervical spine: Incomplete fracture of  the inferior aspect of the  odontoid, type III. Alignment anatomic. No other fractures.  Extensive arthropathy. Areas of spondylolisthesis appear stable  under felt to be due to underlying spondylosis. There is a sizable  free-flowing right pleural effusion.  01/28/14 CXR  IMPRESSION:  No or pneumothorax.      Assessment/Plan Contusion of right orbital tissues Sustained from a fall-no change of vision    Progressive supranuclear palsy Frequent falling. 03/22/14 Neurology: f/u 6 mos. May ST [facility] if swallow became a significant problem.      Dysphagia, oropharyngeal phase Modify diet. Risk for aspiration.     Senile degeneration of brain Noted on CT and MRI of the brain 10/14/09    Gait disorder Started June 2011. Patient has cerebral atrophy, cerebrovascular disease, and supranuclear palsy. Frequent falling.      Family/ Staff Communication: observe the patient.   Goals of Care: SNF  Labs/tests ordered: none

## 2014-07-13 NOTE — Assessment & Plan Note (Signed)
Modify diet. Risk for aspiration.

## 2014-07-13 NOTE — Assessment & Plan Note (Signed)
Started June 2011. Patient has cerebral atrophy, cerebrovascular disease, and supranuclear palsy. Frequent falling.

## 2014-08-06 ENCOUNTER — Encounter: Payer: Self-pay | Admitting: Nurse Practitioner

## 2014-08-06 ENCOUNTER — Non-Acute Institutional Stay (SKILLED_NURSING_FACILITY): Payer: Medicare Other | Admitting: Nurse Practitioner

## 2014-08-06 DIAGNOSIS — R269 Unspecified abnormalities of gait and mobility: Secondary | ICD-10-CM | POA: Diagnosis not present

## 2014-08-06 DIAGNOSIS — S0511XS Contusion of eyeball and orbital tissues, right eye, sequela: Secondary | ICD-10-CM | POA: Diagnosis not present

## 2014-08-06 DIAGNOSIS — S12000S Unspecified displaced fracture of first cervical vertebra, sequela: Secondary | ICD-10-CM | POA: Diagnosis not present

## 2014-08-06 DIAGNOSIS — G231 Progressive supranuclear ophthalmoplegia [Steele-Richardson-Olszewski]: Secondary | ICD-10-CM

## 2014-08-06 DIAGNOSIS — G311 Senile degeneration of brain, not elsewhere classified: Secondary | ICD-10-CM

## 2014-08-06 DIAGNOSIS — R1312 Dysphagia, oropharyngeal phase: Secondary | ICD-10-CM | POA: Diagnosis not present

## 2014-08-06 NOTE — Assessment & Plan Note (Signed)
Modify diet. Risk for aspiration.

## 2014-08-06 NOTE — Assessment & Plan Note (Signed)
Healed

## 2014-08-06 NOTE — Assessment & Plan Note (Signed)
Frequent falling. 03/22/14 Neurology: f/u 6 mos. May ST MBS if swallow became a significant problem.

## 2014-08-06 NOTE — Assessment & Plan Note (Signed)
Stable-no c/o pain in neck or new focal neurological symptoms

## 2014-08-06 NOTE — Assessment & Plan Note (Signed)
Noted on CT and MRI of the brain 10/14/09

## 2014-08-06 NOTE — Assessment & Plan Note (Signed)
Started June 2011. Patient has cerebral atrophy, cerebrovascular disease, and supranuclear palsy. Frequent falling.

## 2014-08-06 NOTE — Progress Notes (Signed)
Patient ID: Monica Waller, female   DOB: [birth year], 81 y.o.   MRN: [id number]   Code Status: DNR  No Known Allergies  Chief Complaint  Patient presents with  . Medical Management of Chronic Issues    HPI: Patient is a 81 y.o. female seen in the SNF at [facility] today for evaluation of chronic medical conditions.  Problem List Items Addressed This Visit    Senile degeneration of brain - Primary    Noted on CT and MRI of the brain 10/14/09       Progressive supranuclear palsy (Chronic)    Frequent falling. 03/22/14 Neurology: f/u 6 mos. May ST [facility]BS if swallow became a significant problem.         Gait disorder    Started June 2011. Patient has cerebral atrophy, cerebrovascular disease, and supranuclear palsy. Frequent falling.      Dysphagia, oropharyngeal phase (Chronic)    Modify diet. Risk for aspiration.         Contusion of right orbital tissues    Healed.       C1 cervical fracture    Stable-no c/o pain in neck or new focal neurological symptoms         Review of Systems:  Review of Systems  Constitutional: Positive for fatigue.  HENT: Positive for hearing loss. Negative for ear pain.   Eyes: Positive for visual disturbance (Corrective lenses).  Respiratory: Negative.   Cardiovascular: Negative for chest pain, palpitations and leg swelling.  Gastrointestinal: Negative.   Endocrine: Negative for cold intolerance, heat intolerance, polydipsia and polyphagia.  Genitourinary: Negative.   Musculoskeletal: Positive for gait problem.       Unstable gait. History of multiple falls.  Skin:       History right mastectomy for breast cancer   Neurological:       Progressive supranuclear palsy. Ataxia. Unstable gait. Clumsy right leg and foot.  Hematological: Negative.   Psychiatric/Behavioral: Negative.      Past Medical History  Diagnosis Date  . Cerebral degeneration   . Ataxia   . Gait disorder   . Progressive supranuclear palsy   . Subdural  hematoma     Hx. of a left frontal  . 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 Other generalized ischemic cerebrovascular disease 10/14/09    Noted on CT and MR brain 10/14/09  . Dysphagia, oropharyngeal phase   . Cancer     Breast  . Malignant neoplasm of breast (female), unspecified site   . Central retinal artery occlusion 12/23/2013    Left eye  . Supranuclear palsy    Past Surgical History  Procedure Laterality Date  . Mastectomy Right 1998    Dr. Rebekah Chesterfield  . Tonsillectomy    . Cataract extraction, bilateral     Social History:   reports that she quit smoking about 49 years ago. Her smoking use included Cigarettes. She has never used smokeless tobacco. She reports that she does not drink alcohol or use illicit drugs.  Family History  Problem Relation Age of Onset  . Other Mother     "Old Age"  . Cancer Father   . Cancer Brother     Colon    Medications: Patient's Medications  New Prescriptions   No medications on file  Previous Medications   ACETAMINOPHEN (TYLENOL) 325 MG TABLET    Take 2 tablets (650 mg total) by mouth every 6 (six) hours  as needed.   ASPIRIN 81 MG TABLET    Take 81 mg by mouth daily.   CALCIUM CARB-CHOLECALCIFEROL (CALTRATE 600+D) 600-800 MG-UNIT TABS    Take 1 tablet by mouth.   MAGNESIUM HYDROXIDE (MILK OF MAGNESIA) 400 MG/5ML SUSPENSION    Take 30 mLs by mouth daily as needed for mild constipation.   MULTIVITAMIN-IRON-MINERALS-FOLIC ACID (CENTRUM) CHEWABLE TABLET    Chew 1 tablet by mouth daily.   POLYVINYL ALCOHOL (ARTIFICIAL TEARS) 1.4 % OPHTHALMIC SOLUTION    1 drop as needed for dry eyes. *wait 2-5 minutes between eye drops , twice daily   SENNA (SENOKOT) 8.6 MG TABLET    Take 1 tablet by mouth 2 (two) times daily.  Modified Medications   No medications on file  Discontinued Medications   No medications on file     Physical Exam: Physical Exam  Constitutional: She is oriented to person, place, and  time. She appears well-developed and well-nourished. No distress.  HENT:  Head: Normocephalic.  Right Ear: External ear normal.  Left Ear: External ear normal.  Nose: Nose normal.  Mouth/Throat: Oropharynx is clear and moist. No oropharyngeal exudate.  Eyes: Conjunctivae are normal. Right eye exhibits no discharge. Left eye exhibits no discharge. No scleral icterus.  Difficulty looking down followed by an upgaze ?palsy  Neck: Normal range of motion. Neck supple. No JVD present. No tracheal deviation present. No thyromegaly present.  Cardiovascular: Normal rate, regular rhythm, normal heart sounds and intact distal pulses.   No murmur heard. Pulmonary/Chest: Effort normal and breath sounds normal. No stridor. No respiratory distress. She has no wheezes. She has no rales. She exhibits no tenderness.  Abdominal: Soft. Bowel sounds are normal. She exhibits no distension. There is no tenderness. There is no rebound and no guarding.  Musculoskeletal: Normal range of motion. She exhibits no edema or tenderness.  Lymphadenopathy:    She has no cervical adenopathy.  Neurological: She is alert and oriented to person, place, and time. She displays normal reflexes. No cranial nerve deficit. She exhibits normal muscle tone. Coordination abnormal.  Skin: Skin is warm and dry. No rash noted. She is not diaphoretic. No erythema. No pallor.  Psychiatric: She has a normal mood and affect. Her speech is normal and behavior is normal. Judgment and thought content normal. Cognition and memory are impaired. She does not express impulsivity or inappropriate judgment. She exhibits abnormal recent memory. She exhibits normal remote memory.   Filed Vitals:   08/06/14 1242  BP: 121/59  Pulse: 64  Temp: 97.8 F (36.6 C)  TempSrc: Tympanic  Resp: 18      Labs reviewed: Basic Metabolic Panel:  Recent Labs  01/27/14 1647  NA 138  K 4.0  CL 103  CO2 21  GLUCOSE 93  BUN 26*  CREATININE 0.81  CALCIUM 9.5    Liver Function Tests: No results for input(s): AST, ALT, ALKPHOS, BILITOT, PROT, ALBUMIN in the last 8760 hours. No results for input(s): LIPASE, AMYLASE in the last 8760 hours. No results for input(s): AMMONIA in the last 8760 hours. CBC:  Recent Labs  01/27/14 1647 01/28/14 0500  WBC 8.1 6.6  HGB 13.4 13.3  HCT 40.3 38.8  MCV 92.0 92.8  PLT 273 241   Lipid Panel: No results for input(s): CHOL, HDL, LDLCALC, TRIG, CHOLHDL, LDLDIRECT in the last 8760 hours.  Past Procedures:01/16/13 CT head Wo Contrast:   IMPRESSION:  Atrophy and chronic microvascular ischemia. No acute abnormality   09/25/13 CT head, cervical spine,  maxillary bones:   IMPRESSION:  No acute intracranial abnormality. Chronic and involutional changes  appreciated.  Left periorbital hematoma. No evidence of acute fracture nor  dislocation. Findings which may represent sinus disease within the  ethmoid air cells.  Multilevel spondylosis within the cervical spine without acute  osseous abnormalities.  01/27/14 CXR  IMPRESSION:  Displaced right posterior seventh rib fracture with right apical  pneumothorax. Right base infiltrate. Underlying emphysema. Left lung  clear.  01/27/14 CT head and cervical spine:  IMPRESSION:  CT head: Diffuse opacification of most of the left mastoid air cells  as well as much of the left external auditory canal and essentially  the entire left middle ear region. Question traumatic versus  inflammatory etiology. No fracture or air-fluid level is seen in  this region. Mastoids and external auditory canal/middle ear on the  right are clear.  Atrophy with patchy small vessel disease, stable. No intracranial  mass, hemorrhage, or extra-axial fluid. There is a left frontal  scalp hematoma.  CT cervical spine: Incomplete fracture of the inferior aspect of the  odontoid, type III. Alignment anatomic. No other fractures.  Extensive arthropathy. Areas of  spondylolisthesis appear stable  under felt to be due to underlying spondylosis. There is a sizable  free-flowing right pleural effusion.  01/28/14 CXR  IMPRESSION:  No or pneumothorax.   Assessment/Plan Senile degeneration of brain Noted on CT and MRI of the brain 10/14/09    Progressive supranuclear palsy Frequent falling. 03/22/14 Neurology: f/u 6 mos. May ST MBS if swallow became a significant problem.      Gait disorder Started June 2011. Patient has cerebral atrophy, cerebrovascular disease, and supranuclear palsy. Frequent falling.   Dysphagia, oropharyngeal phase Modify diet. Risk for aspiration.      Contusion of right orbital tissues Healed.    C1 cervical fracture Stable-no c/o pain in neck or new focal neurological symptoms     Family/ Staff Communication: observe the patient.   Goals of Care: SNF  Labs/tests ordered: none

## 2014-09-07 ENCOUNTER — Encounter: Payer: Self-pay | Admitting: Nurse Practitioner

## 2014-09-07 ENCOUNTER — Non-Acute Institutional Stay (SKILLED_NURSING_FACILITY): Payer: Medicare Other | Admitting: Nurse Practitioner

## 2014-09-07 DIAGNOSIS — G231 Progressive supranuclear ophthalmoplegia [Steele-Richardson-Olszewski]: Secondary | ICD-10-CM | POA: Diagnosis not present

## 2014-09-07 DIAGNOSIS — G311 Senile degeneration of brain, not elsewhere classified: Secondary | ICD-10-CM | POA: Diagnosis not present

## 2014-09-07 DIAGNOSIS — R1312 Dysphagia, oropharyngeal phase: Secondary | ICD-10-CM | POA: Diagnosis not present

## 2014-09-07 DIAGNOSIS — R269 Unspecified abnormalities of gait and mobility: Secondary | ICD-10-CM | POA: Diagnosis not present

## 2014-09-07 DIAGNOSIS — R5383 Other fatigue: Secondary | ICD-10-CM | POA: Diagnosis not present

## 2014-09-07 DIAGNOSIS — S12000S Unspecified displaced fracture of first cervical vertebra, sequela: Secondary | ICD-10-CM

## 2014-09-07 NOTE — Assessment & Plan Note (Signed)
Noted on CT and MRI of the brain 10/14/09

## 2014-09-07 NOTE — Assessment & Plan Note (Signed)
Stable-no c/o pain in neck or new focal neurological symptoms

## 2014-09-07 NOTE — Assessment & Plan Note (Signed)
Started June 2011. Patient has cerebral atrophy, cerebrovascular disease, and supranuclear palsy. Frequent falling.

## 2014-09-07 NOTE — Progress Notes (Signed)
Patient ID: Monica Waller, female   DOB: 04-Dec-1923, 81 y.o.   MRN: [id number]   Code Status: DNR  No Known Allergies  Chief Complaint  Patient presents with  . Medical Management of Chronic Issues  . Acute Visit    lethargy    HPI: Patient is a 81 y.o. female seen in the SNF at [facility] today for evaluation of chronic medical conditions.  Problem List Items Addressed This Visit    Progressive supranuclear palsy (Chronic)    Frequent falling. 03/22/14 Neurology: f/u 6 mos. May ST MBS if swallow became a significant problem.         Dysphagia, oropharyngeal phase (Chronic)    Modify diet. Risk for aspiration.         Senile degeneration of brain    Noted on CT and MRI of the brain 10/14/09        Gait disorder    Started June 2011. Patient has cerebral atrophy, cerebrovascular disease, and supranuclear palsy. Frequent falling.       C1 cervical fracture    Stable-no c/o pain in neck or new focal neurological symptoms      Lethargy - Primary    Transient without focal neurological deficit-open eyes when she was asked to for a few seconds-resolved without intervention. Will update CBC, CMP, TSH, and UA C/S in am.          Review of Systems:  Review of Systems  Constitutional: Positive for fatigue.  HENT: Positive for hearing loss. Negative for ear pain.   Eyes: Positive for visual disturbance (Corrective lenses).  Respiratory: Negative.   Cardiovascular: Negative for chest pain, palpitations and leg swelling.  Gastrointestinal: Negative.   Endocrine: Negative for cold intolerance, heat intolerance, polydipsia and polyphagia.  Genitourinary: Negative.   Musculoskeletal: Positive for gait problem.       Unstable gait. History of multiple falls.  Skin:       History right mastectomy for breast cancer   Neurological:       Progressive supranuclear palsy. Ataxia. Unstable gait. Clumsy right leg and foot.  Hematological: Negative.     Psychiatric/Behavioral: Negative.      Past Medical History  Diagnosis Date  . Cerebral degeneration   . Ataxia   . Gait disorder   . Progressive supranuclear palsy   . Subdural hematoma     Hx. of a left frontal  . 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 Other generalized ischemic cerebrovascular disease 10/14/09    Noted on CT and MR brain 10/14/09  . Dysphagia, oropharyngeal phase   . Cancer     Breast  . Malignant neoplasm of breast (female), unspecified site   . Central retinal artery occlusion 12/23/2013    Left eye  . Supranuclear palsy    Past Surgical History  Procedure Laterality Date  . Mastectomy Right 1998    Dr. Rebekah Chesterfield  . Tonsillectomy    . Cataract extraction, bilateral     Social History:   reports that she quit smoking about 49 years ago. Her smoking use included Cigarettes. She has never used smokeless tobacco. She reports that she does not drink alcohol or use illicit drugs.  Family History  Problem Relation Age of Onset  . Other Mother     "Old Age"  . Cancer Father   . Cancer Brother     Colon    Medications: Patient's Medications  New Prescriptions   No medications on file  Previous Medications   ACETAMINOPHEN (TYLENOL) 325 MG TABLET    Take 2 tablets (650 mg total) by mouth every 6 (six) hours as needed.   ASPIRIN 81 MG TABLET    Take 81 mg by mouth daily.   CALCIUM CARB-CHOLECALCIFEROL (CALTRATE 600+D) 600-800 MG-UNIT TABS    Take 1 tablet by mouth.   MAGNESIUM HYDROXIDE (MILK OF MAGNESIA) 400 MG/5ML SUSPENSION    Take 30 mLs by mouth daily as needed for mild constipation.   MULTIVITAMIN-IRON-MINERALS-FOLIC ACID (CENTRUM) CHEWABLE TABLET    Chew 1 tablet by mouth daily.   POLYVINYL ALCOHOL (ARTIFICIAL TEARS) 1.4 % OPHTHALMIC SOLUTION    1 drop as needed for dry eyes. *wait 2-5 minutes between eye drops , twice daily   SENNA (SENOKOT) 8.6 MG TABLET    Take 1 tablet by mouth 2 (two) times daily.   Modified Medications   No medications on file  Discontinued Medications   No medications on file     Physical Exam: Physical Exam  Constitutional: She is oriented to person, place, and time. She appears well-developed and well-nourished. No distress.  HENT:  Head: Normocephalic.  Right Ear: External ear normal.  Left Ear: External ear normal.  Nose: Nose normal.  Mouth/Throat: Oropharynx is clear and moist. No oropharyngeal exudate.  Eyes: Conjunctivae are normal. Right eye exhibits no discharge. Left eye exhibits no discharge. No scleral icterus.  Difficulty looking down followed by an upgaze ?palsy  Neck: Normal range of motion. Neck supple. No JVD present. No tracheal deviation present. No thyromegaly present.  Cardiovascular: Normal rate, regular rhythm, normal heart sounds and intact distal pulses.   No murmur heard. Pulmonary/Chest: Effort normal and breath sounds normal. No stridor. No respiratory distress. She has no wheezes. She has no rales. She exhibits no tenderness.  Abdominal: Soft. Bowel sounds are normal. She exhibits no distension. There is no tenderness. There is no rebound and no guarding.  Musculoskeletal: Normal range of motion. She exhibits no edema or tenderness.  Lymphadenopathy:    She has no cervical adenopathy.  Neurological: She is alert and oriented to person, place, and time. She displays normal reflexes. No cranial nerve deficit. She exhibits normal muscle tone. Coordination abnormal.  Skin: Skin is warm and dry. No rash noted. She is not diaphoretic. No erythema. No pallor.  Psychiatric: She has a normal mood and affect. Her speech is normal and behavior is normal. Judgment and thought content normal. Cognition and memory are impaired. She does not express impulsivity or inappropriate judgment. She exhibits abnormal recent memory. She exhibits normal remote memory.   Filed Vitals:   09/07/14 1305  BP: 140/80  Pulse: 60  Temp: 97.7 F (36.5 C)   TempSrc: Tympanic  Resp: 16      Labs reviewed: Basic Metabolic Panel:  Recent Labs  01/27/14 1647  NA 138  K 4.0  CL 103  CO2 21  GLUCOSE 93  BUN 26*  CREATININE 0.81  CALCIUM 9.5   Liver Function Tests: No results for input(s): AST, ALT, ALKPHOS, BILITOT, PROT, ALBUMIN in the last 8760 hours. No results for input(s): LIPASE, AMYLASE in the last 8760 hours. No results for input(s): AMMONIA in the last 8760 hours. CBC:  Recent Labs  01/27/14 1647 01/28/14 0500  WBC 8.1 6.6  HGB 13.4 13.3  HCT 40.3 38.8  MCV 92.0 92.8  PLT 273 241   Lipid Panel: No results for input(s): CHOL, HDL, LDLCALC, TRIG,  CHOLHDL, LDLDIRECT in the last 8760 hours.  Past Procedures:01/16/13 CT head Wo Contrast:   IMPRESSION:  Atrophy and chronic microvascular ischemia. No acute abnormality   09/25/13 CT head, cervical spine, maxillary bones:   IMPRESSION:  No acute intracranial abnormality. Chronic and involutional changes  appreciated.  Left periorbital hematoma. No evidence of acute fracture nor  dislocation. Findings which may represent sinus disease within the  ethmoid air cells.  Multilevel spondylosis within the cervical spine without acute  osseous abnormalities.  01/27/14 CXR  IMPRESSION:  Displaced right posterior seventh rib fracture with right apical  pneumothorax. Right base infiltrate. Underlying emphysema. Left lung  clear.  01/27/14 CT head and cervical spine:  IMPRESSION:  CT head: Diffuse opacification of most of the left mastoid air cells  as well as much of the left external auditory canal and essentially  the entire left middle ear region. Question traumatic versus  inflammatory etiology. No fracture or air-fluid level is seen in  this region. Mastoids and external auditory canal/middle ear on the  right are clear.  Atrophy with patchy small vessel disease, stable. No intracranial  mass, hemorrhage, or extra-axial fluid. There is a left  frontal  scalp hematoma.  CT cervical spine: Incomplete fracture of the inferior aspect of the  odontoid, type III. Alignment anatomic. No other fractures.  Extensive arthropathy. Areas of spondylolisthesis appear stable  under felt to be due to underlying spondylosis. There is a sizable  free-flowing right pleural effusion.  01/28/14 CXR  IMPRESSION:  No or pneumothorax.   Assessment/Plan Lethargy Transient without focal neurological deficit-open eyes when she was asked to for a few seconds-resolved without intervention. Will update CBC, CMP, TSH, and UA C/S in am.    Progressive supranuclear palsy Frequent falling. 03/22/14 Neurology: f/u 6 mos. May ST MBS if swallow became a significant problem.      Dysphagia, oropharyngeal phase Modify diet. Risk for aspiration.      Senile degeneration of brain Noted on CT and MRI of the brain 10/14/09     Gait disorder Started June 2011. Patient has cerebral atrophy, cerebrovascular disease, and supranuclear palsy. Frequent falling.    C1 cervical fracture Stable-no c/o pain in neck or new focal neurological symptoms     Family/ Staff Communication: observe the patient.   Goals of Care: SNF  Labs/tests ordered: UA C/S, CBC, CMP in am

## 2014-09-07 NOTE — Assessment & Plan Note (Signed)
Frequent falling. 03/22/14 Neurology: f/u 6 mos. May ST MBS if swallow became a significant problem.

## 2014-09-07 NOTE — Assessment & Plan Note (Signed)
Transient without focal neurological deficit-open eyes when she was asked to for a few seconds-resolved without intervention. Will update CBC, CMP, TSH, and UA C/S in am.

## 2014-09-07 NOTE — Assessment & Plan Note (Signed)
Modify diet. Risk for aspiration.

## 2014-09-08 LAB — TSH: TSH: 3.22 u[IU]/mL (ref 0.41–5.90)

## 2014-09-08 LAB — CBC AND DIFFERENTIAL
HEMATOCRIT: 38 % (ref 36–46)
Hemoglobin: 12.7 g/dL (ref 12.0–16.0)
Platelets: 241 10*3/uL (ref 150–399)
WBC: 6.4 10^3/mL

## 2014-09-08 LAB — HEPATIC FUNCTION PANEL
ALK PHOS: 63 U/L (ref 25–125)
ALT: 13 U/L (ref 7–35)
AST: 19 U/L (ref 13–35)
BILIRUBIN, TOTAL: 0.6 mg/dL

## 2014-09-08 LAB — BASIC METABOLIC PANEL
BUN: 24 mg/dL — AB (ref 4–21)
Creatinine: 1 mg/dL (ref 0.5–1.1)
Glucose: 85 mg/dL
Potassium: 4.2 mmol/L (ref 3.4–5.3)
SODIUM: 139 mmol/L (ref 137–147)

## 2014-09-10 ENCOUNTER — Encounter: Payer: Self-pay | Admitting: Nurse Practitioner

## 2014-09-10 ENCOUNTER — Non-Acute Institutional Stay (SKILLED_NURSING_FACILITY): Payer: Medicare Other | Admitting: Nurse Practitioner

## 2014-09-10 DIAGNOSIS — N390 Urinary tract infection, site not specified: Secondary | ICD-10-CM | POA: Diagnosis not present

## 2014-09-10 DIAGNOSIS — R1312 Dysphagia, oropharyngeal phase: Secondary | ICD-10-CM

## 2014-09-10 DIAGNOSIS — G231 Progressive supranuclear ophthalmoplegia [Steele-Richardson-Olszewski]: Secondary | ICD-10-CM | POA: Diagnosis not present

## 2014-09-10 DIAGNOSIS — R269 Unspecified abnormalities of gait and mobility: Secondary | ICD-10-CM

## 2014-09-10 DIAGNOSIS — G311 Senile degeneration of brain, not elsewhere classified: Secondary | ICD-10-CM

## 2014-09-10 DIAGNOSIS — R5383 Other fatigue: Secondary | ICD-10-CM | POA: Diagnosis not present

## 2014-09-10 NOTE — Assessment & Plan Note (Signed)
09/09/14 identified UTI treated with 7 day course of PCN. CBC, CMP and TSH unremarkable.

## 2014-09-10 NOTE — Progress Notes (Signed)
Patient ID: Monica Waller, female   DOB: 25-Aug-1923, 81 y.o.   MRN: [id number]   Code Status: DNR  No Known Allergies  Chief Complaint  Patient presents with  . Medical Management of Chronic Issues  . Acute Visit    UTI    HPI: Patient is a 81 y.o. female seen in the SNF at [facility] today for evaluation of UTI and chronic medical conditions.  Problem List Items Addressed This Visit    Progressive supranuclear palsy (Chronic)    Frequent falling. 03/22/14 Neurology: f/u 6 mos. May ST MBS if swallow became a significant problem.        Dysphagia, oropharyngeal phase (Chronic)    Modify diet. Risk for aspiration.       UTI (lower urinary tract infection) - Primary    10/05/13 urine culture: K. Pneumoniae Septra DS bid x 7days.  09/09/14 urine culture lactobacillus: Augmentin 875mg  bid x 7 days.        Senile degeneration of brain    Noted on CT and MRI of the brain 10/14/09        Gait disorder    Started June 2011. Patient has cerebral atrophy, cerebrovascular disease, and supranuclear palsy. Frequent falling.        Lethargy    09/09/14 identified UTI treated with 7 day course of PCN. CBC, CMP and TSH unremarkable.           Review of Systems:  Review of Systems  Constitutional: Positive for fatigue.  HENT: Positive for hearing loss. Negative for ear pain.   Eyes: Positive for visual disturbance (Corrective lenses).  Respiratory: Negative.   Cardiovascular: Negative for chest pain, palpitations and leg swelling.  Gastrointestinal: Negative.   Endocrine: Negative for cold intolerance, heat intolerance, polydipsia and polyphagia.  Genitourinary: Negative.   Musculoskeletal: Positive for gait problem.       Unstable gait. History of multiple falls.  Skin:       History right mastectomy for breast cancer   Neurological:       Progressive supranuclear palsy. Ataxia. Unstable gait. Clumsy right leg and foot.  Hematological: Negative.     Psychiatric/Behavioral: Negative.      Past Medical History  Diagnosis Date  . Cerebral degeneration   . Ataxia   . Gait disorder   . Progressive supranuclear palsy   . Subdural hematoma     Hx. of a left frontal  . 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 Other generalized ischemic cerebrovascular disease 10/14/09    Noted on CT and MR brain 10/14/09  . Dysphagia, oropharyngeal phase   . Cancer     Breast  . Malignant neoplasm of breast (female), unspecified site   . Central retinal artery occlusion 12/23/2013    Left eye  . Supranuclear palsy    Past Surgical History  Procedure Laterality Date  . Mastectomy Right 1998    Dr. Rebekah Chesterfield  . Tonsillectomy    . Cataract extraction, bilateral     Social History:   reports that she quit smoking about 49 years ago. Her smoking use included Cigarettes. She has never used smokeless tobacco. She reports that she does not drink alcohol or use illicit drugs.  Family History  Problem Relation Age of Onset  . Other Mother     "Old Age"  . Cancer Father   . Cancer Brother     Colon  Medications: Patient's Medications  New Prescriptions   No medications on file  Previous Medications   ACETAMINOPHEN (TYLENOL) 325 MG TABLET    Take 2 tablets (650 mg total) by mouth every 6 (six) hours as needed.   ASPIRIN 81 MG TABLET    Take 81 mg by mouth daily.   CALCIUM CARB-CHOLECALCIFEROL (CALTRATE 600+D) 600-800 MG-UNIT TABS    Take 1 tablet by mouth.   MAGNESIUM HYDROXIDE (MILK OF MAGNESIA) 400 MG/5ML SUSPENSION    Take 30 mLs by mouth daily as needed for mild constipation.   MULTIVITAMIN-IRON-MINERALS-FOLIC ACID (CENTRUM) CHEWABLE TABLET    Chew 1 tablet by mouth daily.   POLYVINYL ALCOHOL (ARTIFICIAL TEARS) 1.4 % OPHTHALMIC SOLUTION    1 drop as needed for dry eyes. *wait 2-5 minutes between eye drops , twice daily   SENNA (SENOKOT) 8.6 MG TABLET    Take 1 tablet by mouth 2 (two) times daily.   Modified Medications   No medications on file  Discontinued Medications   No medications on file     Physical Exam: Physical Exam  Constitutional: She is oriented to person, place, and time. She appears well-developed and well-nourished. No distress.  HENT:  Head: Normocephalic.  Right Ear: External ear normal.  Left Ear: External ear normal.  Nose: Nose normal.  Mouth/Throat: Oropharynx is clear and moist. No oropharyngeal exudate.  Eyes: Conjunctivae are normal. Right eye exhibits no discharge. Left eye exhibits no discharge. No scleral icterus.  Difficulty looking down followed by an upgaze ?palsy  Neck: Normal range of motion. Neck supple. No JVD present. No tracheal deviation present. No thyromegaly present.  Cardiovascular: Normal rate, regular rhythm, normal heart sounds and intact distal pulses.   No murmur heard. Pulmonary/Chest: Effort normal and breath sounds normal. No stridor. No respiratory distress. She has no wheezes. She has no rales. She exhibits no tenderness.  Abdominal: Soft. Bowel sounds are normal. She exhibits no distension. There is no tenderness. There is no rebound and no guarding.  Musculoskeletal: Normal range of motion. She exhibits no edema or tenderness.  Lymphadenopathy:    She has no cervical adenopathy.  Neurological: She is alert and oriented to person, place, and time. She displays normal reflexes. No cranial nerve deficit. She exhibits normal muscle tone. Coordination abnormal.  Skin: Skin is warm and dry. No rash noted. She is not diaphoretic. No erythema. No pallor.  Psychiatric: She has a normal mood and affect. Her speech is normal and behavior is normal. Judgment and thought content normal. Cognition and memory are impaired. She does not express impulsivity or inappropriate judgment. She exhibits abnormal recent memory. She exhibits normal remote memory.   Filed Vitals:   09/10/14 1352  BP: 134/62  Pulse: 72  Temp: 97.4 F (36.3 C)   TempSrc: Tympanic  Resp: 20      Labs reviewed: Basic Metabolic Panel:  Recent Labs  01/27/14 1647 09/08/14  NA 138 139  K 4.0 4.2  CL 103  --   CO2 21  --   GLUCOSE 93  --   BUN 26* 24*  CREATININE 0.81 1.0  CALCIUM 9.5  --   TSH  --  3.22   Liver Function Tests:  Recent Labs  09/08/14  AST 19  ALT 13  ALKPHOS 63   No results for input(s): LIPASE, AMYLASE in the last 8760 hours. No results for input(s): AMMONIA in the last 8760 hours. CBC:  Recent Labs  01/27/14 1647 01/28/14 0500 09/08/14  WBC 8.1  6.6 6.4  HGB 13.4 13.3 12.7  HCT 40.3 38.8 38  MCV 92.0 92.8  --   PLT 273 241 241   Lipid Panel: No results for input(s): CHOL, HDL, LDLCALC, TRIG, CHOLHDL, LDLDIRECT in the last 8760 hours.  Past Procedures:01/16/13 CT head Wo Contrast:   IMPRESSION:  Atrophy and chronic microvascular ischemia. No acute abnormality   09/25/13 CT head, cervical spine, maxillary bones:   IMPRESSION:  No acute intracranial abnormality. Chronic and involutional changes  appreciated.  Left periorbital hematoma. No evidence of acute fracture nor  dislocation. Findings which may represent sinus disease within the  ethmoid air cells.  Multilevel spondylosis within the cervical spine without acute  osseous abnormalities.  01/27/14 CXR  IMPRESSION:  Displaced right posterior seventh rib fracture with right apical  pneumothorax. Right base infiltrate. Underlying emphysema. Left lung  clear.  01/27/14 CT head and cervical spine:  IMPRESSION:  CT head: Diffuse opacification of most of the left mastoid air cells  as well as much of the left external auditory canal and essentially  the entire left middle ear region. Question traumatic versus  inflammatory etiology. No fracture or air-fluid level is seen in  this region. Mastoids and external auditory canal/middle ear on the  right are clear.  Atrophy with patchy small vessel disease, stable. No intracranial   mass, hemorrhage, or extra-axial fluid. There is a left frontal  scalp hematoma.  CT cervical spine: Incomplete fracture of the inferior aspect of the  odontoid, type III. Alignment anatomic. No other fractures.  Extensive arthropathy. Areas of spondylolisthesis appear stable  under felt to be due to underlying spondylosis. There is a sizable  free-flowing right pleural effusion.  01/28/14 CXR  IMPRESSION:  No or pneumothorax.   Assessment/Plan UTI (lower urinary tract infection) 10/05/13 urine culture: K. Pneumoniae Septra DS bid x 7days.  09/09/14 urine culture lactobacillus: Augmentin 875mg  bid x 7 days.     Lethargy 09/09/14 identified UTI treated with 7 day course of PCN. CBC, CMP and TSH unremarkable.     Progressive supranuclear palsy Frequent falling. 03/22/14 Neurology: f/u 6 mos. May ST MBS if swallow became a significant problem.     Dysphagia, oropharyngeal phase Modify diet. Risk for aspiration.    Senile degeneration of brain Noted on CT and MRI of the brain 10/14/09     Gait disorder Started June 2011. Patient has cerebral atrophy, cerebrovascular disease, and supranuclear palsy. Frequent falling.       Family/ Staff Communication: observe the patient.   Goals of Care: SNF  Labs/tests ordered: none

## 2014-09-10 NOTE — Assessment & Plan Note (Signed)
10/05/13 urine culture: K. Pneumoniae Septra DS bid x 7days.  09/09/14 urine culture lactobacillus: Augmentin 875mg  bid x 7 days.

## 2014-09-10 NOTE — Assessment & Plan Note (Signed)
Noted on CT and MRI of the brain 10/14/09

## 2014-09-10 NOTE — Assessment & Plan Note (Signed)
Started June 2011. Patient has cerebral atrophy, cerebrovascular disease, and supranuclear palsy. Frequent falling.

## 2014-09-10 NOTE — Assessment & Plan Note (Signed)
Frequent falling. 03/22/14 Neurology: f/u 6 mos. May ST MBS if swallow became a significant problem.

## 2014-09-10 NOTE — Assessment & Plan Note (Signed)
Modify diet. Risk for aspiration.

## 2014-09-22 ENCOUNTER — Ambulatory Visit: Payer: Medicare Other | Admitting: Nurse Practitioner

## 2014-10-05 ENCOUNTER — Ambulatory Visit (INDEPENDENT_AMBULATORY_CARE_PROVIDER_SITE_OTHER): Payer: Medicare Other | Admitting: Nurse Practitioner

## 2014-10-05 ENCOUNTER — Encounter: Payer: Self-pay | Admitting: Nurse Practitioner

## 2014-10-05 VITALS — BP 124/70 | HR 71 | Ht 66.0 in | Wt 125.0 lb

## 2014-10-05 DIAGNOSIS — G231 Progressive supranuclear ophthalmoplegia [Steele-Richardson-Olszewski]: Secondary | ICD-10-CM

## 2014-10-05 DIAGNOSIS — R269 Unspecified abnormalities of gait and mobility: Secondary | ICD-10-CM

## 2014-10-05 NOTE — Progress Notes (Signed)
[facility]  PATIENT: Monica Waller DOB: 01-04-1924   REASON FOR VISIT: Follow-up for progressive super nuclear palsy , gait abnormality, history of ischemic optic neuropathy OS HISTORY FROM: Patient and friend    HISTORY OF PRESENT ILLNESS:Ms. Monica Waller is an 81 year old left-handed white female with a history of progressive supranuclear palsy. Patient was last seen by Dr. Jannifer Franklin 03/22/2014. 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 The patient did not know when the actual event occurred, and the event was painless in onset. The patient has undergone a cerebrovascular workup that revealed an unremarkable MRI of the brain, carotid Doppler studies were unremarkable, and a sedimentation rate was normal. The patient has been placed on low-dose aspirin therapy. She has not regained any of her sight in the left eye, but no visual changes have been noted either. The patient has not been able to read secondary to severe restrictions and eye movements in all fields of gaze. The patient is nonambulatory, and she was  having increasing difficulty with speech when last seen however this has not progressed.. The patient reports difficulty with swallowing, but she does not wish to have a swallowing evaluation. She lives in a skilled facility. Apparently she is  walked to the dining room 3 times a day for her meals She returns for an evaluation.    REVIEW OF SYSTEMS: Full 14 system review of systems performed and notable only for those listed, all others are neg:  Constitutional: neg  Cardiovascular: neg Ear/Nose/Throat: neg  Skin: neg Eyes: neg Respiratory: neg Gastroitestinal: neg  Hematology/Lymphatic: neg  Endocrine: neg Musculoskeletal:neg Allergy/Immunology: neg Neurological: neg Psychiatric: neg Sleep : neg   ALLERGIES: No Known Allergies  HOME MEDICATIONS: Outpatient Prescriptions Prior to Visit  Medication Sig Dispense  Refill  . acetaminophen (TYLENOL) 325 MG tablet Take 2 tablets (650 mg total) by mouth every 6 (six) hours as needed.    Marland Kitchen aspirin 81 MG tablet Take 81 mg by mouth daily.    . Calcium Carb-Cholecalciferol (CALTRATE 600+D) 600-800 MG-UNIT TABS Take 1 tablet by mouth.    . multivitamin-iron-minerals-folic acid (CENTRUM) chewable tablet Chew 1 tablet by mouth daily.    . polyvinyl alcohol (ARTIFICIAL TEARS) 1.4 % ophthalmic solution 1 drop as needed for dry eyes. *wait 2-5 minutes between eye drops , twice daily    . senna (SENOKOT) 8.6 MG tablet Take 1 tablet by mouth 2 (two) times daily.    . magnesium hydroxide (MILK OF MAGNESIA) 400 MG/5ML suspension Take 30 mLs by mouth daily as needed for mild constipation.     No facility-administered medications prior to visit.    PAST MEDICAL HISTORY: Past Medical History  Diagnosis Date  . Cerebral degeneration   . Ataxia   . Gait disorder   . Progressive supranuclear palsy   . Subdural hematoma     Hx. of a left frontal  . 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 Other generalized ischemic cerebrovascular disease 10/14/09    Noted on CT and MR brain 10/14/09  . Dysphagia, oropharyngeal phase   . Cancer     Breast  . Malignant neoplasm of breast (female), unspecified site   . Central retinal artery occlusion 12/23/2013    Left eye  . Supranuclear palsy     PAST SURGICAL HISTORY: Past Surgical History  Procedure Laterality Date  . Mastectomy Right 1998  Dr. Rebekah Chesterfield  . Tonsillectomy    . Cataract extraction, bilateral      FAMILY HISTORY: Family History  Problem Relation Age of Onset  . Other Mother     "Old Age"  . Cancer Father   . Cancer Brother     Colon    SOCIAL HISTORY: History   Social History  . Marital Status: Widowed    Spouse Name: N/A  . Number of Children: N/A  . Years of Education: N/A   Occupational History  . retired Quarry manager    Social History Main Topics  .  Smoking status: Former Smoker    Types: Cigarettes    Quit date: 05/04/1965  . Smokeless tobacco: Never Used  . Alcohol Use: No     Comment: Quit drinking  . Drug Use: No  . Sexual Activity: No   Other Topics Concern  . Not on file   Social History Narrative   Lives at [facility] [place]   Widowed   No children; one step daughter Kadyn Chovan   Stopped smoking 1967   Alcohol none   Wheelchair   Exercise none   DNR, MOST, Living Will, POA     PHYSICAL EXAM  Filed Vitals:   10/05/14 1124  BP: 124/70  Pulse: 71  Height: 5\' 6"  (1.676 m)  Weight: 125 lb (56.7 kg)   Body mass index is 20.19 kg/(m^2). General: The patient is alert and cooperative at the time of the examination. Skin: No significant peripheral edema is noted. Neurologic Exam Mental status: The patient is oriented x 3. Follows all commands Cranial nerves: Facial symmetry is present. Speech is slightly dysarthric. Extraocular movements are significantly restricted in a vertical plane. The patient has difficulty tracking objects horizontally. Visual fields are full with the right eye., the patient does not have count fingers with the left eye. Masking of the face is noted. Motor: The patient has good strength in all 4 extremities. Sensory examination: Soft touch sensation is symmetric on the face, arms, legs. Pinprick sensation is also symmetric throughout. Coordination: The patient has good finger-nose-finger and heel-to-shin bilaterally.Rapid alternating movements are impaired bilaterally, somewhat worse on the right. Gait and station: The patient is not ambulatory, she is in a wheelchair, gait was not tested. Reflexes: Deep tendon reflexes are symmetric.   DIAGNOSTIC DATA (LABS, IMAGING, TESTING) - I reviewed patient records, labs, notes, testing and imaging myself where available.  Lab Results  Component Value Date   WBC 6.4 09/08/2014   HGB 12.7 09/08/2014   HCT 38 09/08/2014   MCV 92.8 01/28/2014    PLT 241 09/08/2014      Component Value Date/Time   NA 139 09/08/2014   NA 138 01/27/2014 1647   K 4.2 09/08/2014   CL 103 01/27/2014 1647   CO2 21 01/27/2014 1647   GLUCOSE 93 01/27/2014 1647   BUN 24* 09/08/2014   BUN 26* 01/27/2014 1647   CREATININE 1.0 09/08/2014   CREATININE 0.81 01/27/2014 1647   CALCIUM 9.5 01/27/2014 1647   AST 19 09/08/2014   ALT 13 09/08/2014   ALKPHOS 63 09/08/2014   GFRNONAA 63* 01/27/2014 1647   GFRAA 73* 01/27/2014 1647     Lab Results  Component Value Date   TSH 3.22 09/08/2014      ASSESSMENT AND PLAN  81 y.o. year old female  has a past medical history of Cerebral degeneration; Ataxia; Gait disorder; Progressive supranuclear palsy;  here to follow-up.  Continue low-dose aspirin Patient  does not wish to have further evaluation of swallowing. She will follow-up in 6-8 months Dennie Bible, [facility], [facility] APRN  [facility] [address], [place], [place] [address] [phone]

## 2014-10-05 NOTE — Progress Notes (Signed)
I have read the note, and I agree with the clinical assessment and plan.  WILLIS,CHARLES KEITH   

## 2014-10-05 NOTE — Patient Instructions (Signed)
PER skilled sheet from facility

## 2014-10-19 ENCOUNTER — Non-Acute Institutional Stay (SKILLED_NURSING_FACILITY): Payer: Medicare Other | Admitting: Nurse Practitioner

## 2014-10-19 ENCOUNTER — Encounter: Payer: Self-pay | Admitting: Nurse Practitioner

## 2014-10-19 DIAGNOSIS — G231 Progressive supranuclear ophthalmoplegia [Steele-Richardson-Olszewski]: Secondary | ICD-10-CM | POA: Diagnosis not present

## 2014-10-19 DIAGNOSIS — R5383 Other fatigue: Secondary | ICD-10-CM | POA: Diagnosis not present

## 2014-10-19 DIAGNOSIS — R1312 Dysphagia, oropharyngeal phase: Secondary | ICD-10-CM

## 2014-10-19 DIAGNOSIS — N390 Urinary tract infection, site not specified: Secondary | ICD-10-CM | POA: Diagnosis not present

## 2014-10-19 DIAGNOSIS — G311 Senile degeneration of brain, not elsewhere classified: Secondary | ICD-10-CM | POA: Diagnosis not present

## 2014-10-19 DIAGNOSIS — R269 Unspecified abnormalities of gait and mobility: Secondary | ICD-10-CM

## 2014-10-19 NOTE — Assessment & Plan Note (Signed)
03/22/14 Neurology: f/u 6 mos. May ST MBS if swallow became a significant problem.  10/05/14 Neurology: continue ASA 81mg , does not report swallowing difficulty at this time, make sure she is well hydrated.

## 2014-10-19 NOTE — Assessment & Plan Note (Signed)
Modify diet. Risk for aspiration.

## 2014-10-19 NOTE — Progress Notes (Signed)
Patient ID: Monica Waller, female   DOB: [birth year], 81 y.o.   MRN: [id number]   Code Status: DNR  No Known Allergies  Chief Complaint  Patient presents with  . Medical Management of Chronic Issues    HPI: Patient is a 81 y.o. female seen in the SNF at [facility] today for evaluation of chronic medical conditions.  Problem List Items Addressed This Visit    Progressive supranuclear palsy - Primary (Chronic)    03/22/14 Neurology: f/u 6 mos. May ST MBS if swallow became a significant problem.  10/05/14 Neurology: continue ASA 81mg , does not report swallowing difficulty at this time, make sure she is well hydrated.       Dysphagia, oropharyngeal phase (Chronic)    Modify diet. Risk for aspiration.        UTI (lower urinary tract infection)    10/05/13 urine culture: K. Pneumoniae Septra DS bid x 7days.  09/09/14 urine culture lactobacillus: Augmentin 875mg  bid x 7 days.  10/19/14 asymptomatic.       Senile degeneration of brain    Noted on CT and MRI of the brain 10/14/09      Gait disorder    Started June 2011. Patient has cerebral atrophy, cerebrovascular disease, and supranuclear palsy. Frequent falling. wc for mobility        Lethargy    Resolved after UTI was fully treated 08/2014. She is in her usual state of health         Review of Systems:  Review of Systems  Constitutional: Positive for fatigue.  HENT: Positive for hearing loss. Negative for ear pain.   Eyes: Positive for visual disturbance (Corrective lenses).  Respiratory: Negative.   Cardiovascular: Negative for chest pain, palpitations and leg swelling.  Gastrointestinal: Negative.   Endocrine: Negative for cold intolerance, heat intolerance, polydipsia and polyphagia.  Genitourinary: Negative.   Musculoskeletal: Positive for gait problem.       Unstable gait. History of multiple falls.  Skin:       History right mastectomy for breast cancer   Neurological:       Progressive supranuclear  palsy. Ataxia. Unstable gait. Clumsy right leg and foot.  Hematological: Negative.   Psychiatric/Behavioral: Negative.      Past Medical History  Diagnosis Date  . Cerebral degeneration   . Ataxia   . Gait disorder   . Progressive supranuclear palsy   . Subdural hematoma     Hx. of a left frontal  . 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 Other generalized ischemic cerebrovascular disease 10/14/09    Noted on CT and MR brain 10/14/09  . Dysphagia, oropharyngeal phase   . Cancer     Breast  . Malignant neoplasm of breast (female), unspecified site   . Central retinal artery occlusion 12/23/2013    Left eye  . Supranuclear palsy    Past Surgical History  Procedure Laterality Date  . Mastectomy Right 1998    Dr. Rebekah Chesterfield  . Tonsillectomy    . Cataract extraction, bilateral     Social History:   reports that she quit smoking about 49 years ago. Her smoking use included Cigarettes. She has never used smokeless tobacco. She reports that she does not drink alcohol or use illicit drugs.  Family History  Problem Relation Age of Onset  . Other Mother     "Old Age"  . Cancer Father   . Cancer Brother  Colon    Medications: Patient's Medications  New Prescriptions   No medications on file  Previous Medications   ACETAMINOPHEN (TYLENOL) 325 MG TABLET    Take 2 tablets (650 mg total) by mouth every 6 (six) hours as needed.   ASPIRIN 81 MG TABLET    Take 81 mg by mouth daily.   CALCIUM CARB-CHOLECALCIFEROL (CALTRATE 600+D) 600-800 MG-UNIT TABS    Take 1 tablet by mouth.   MULTIVITAMIN-IRON-MINERALS-FOLIC ACID (CENTRUM) CHEWABLE TABLET    Chew 1 tablet by mouth daily.   POLYVINYL ALCOHOL (ARTIFICIAL TEARS) 1.4 % OPHTHALMIC SOLUTION    1 drop as needed for dry eyes. *wait 2-5 minutes between eye drops , twice daily   SENNA (SENOKOT) 8.6 MG TABLET    Take 1 tablet by mouth 2 (two) times daily.  Modified Medications   No medications on  file  Discontinued Medications   No medications on file     Physical Exam: Physical Exam  Constitutional: She is oriented to person, place, and time. She appears well-developed and well-nourished. No distress.  HENT:  Head: Normocephalic.  Right Ear: External ear normal.  Left Ear: External ear normal.  Nose: Nose normal.  Mouth/Throat: Oropharynx is clear and moist. No oropharyngeal exudate.  Eyes: Conjunctivae are normal. Right eye exhibits no discharge. Left eye exhibits no discharge. No scleral icterus.  Difficulty looking down followed by an upgaze ?palsy  Neck: Normal range of motion. Neck supple. No JVD present. No tracheal deviation present. No thyromegaly present.  Cardiovascular: Normal rate, regular rhythm, normal heart sounds and intact distal pulses.   No murmur heard. Pulmonary/Chest: Effort normal and breath sounds normal. No stridor. No respiratory distress. She has no wheezes. She has no rales. She exhibits no tenderness.  Abdominal: Soft. Bowel sounds are normal. She exhibits no distension. There is no tenderness. There is no rebound and no guarding.  Musculoskeletal: Normal range of motion. She exhibits no edema or tenderness.  Lymphadenopathy:    She has no cervical adenopathy.  Neurological: She is alert and oriented to person, place, and time. She displays normal reflexes. No cranial nerve deficit. She exhibits normal muscle tone. Coordination abnormal.  Skin: Skin is warm and dry. No rash noted. She is not diaphoretic. No erythema. No pallor.  Psychiatric: She has a normal mood and affect. Her speech is normal and behavior is normal. Judgment and thought content normal. Cognition and memory are impaired. She does not express impulsivity or inappropriate judgment. She exhibits abnormal recent memory. She exhibits normal remote memory.   Filed Vitals:   10/19/14 1351  BP: 158/74  Pulse: 69  Temp: 98.5 F (36.9 C)  TempSrc: Tympanic  Resp: 20      Labs  reviewed: Basic Metabolic Panel:  Recent Labs  01/27/14 1647 09/08/14  NA 138 139  K 4.0 4.2  CL 103  --   CO2 21  --   GLUCOSE 93  --   BUN 26* 24*  CREATININE 0.81 1.0  CALCIUM 9.5  --   TSH  --  3.22   Liver Function Tests:  Recent Labs  09/08/14  AST 19  ALT 13  ALKPHOS 63   No results for input(s): LIPASE, AMYLASE in the last 8760 hours. No results for input(s): AMMONIA in the last 8760 hours. CBC:  Recent Labs  01/27/14 1647 01/28/14 0500 09/08/14  WBC 8.1 6.6 6.4  HGB 13.4 13.3 12.7  HCT 40.3 38.8 38  MCV 92.0 92.8  --  PLT 273 241 241   Lipid Panel: No results for input(s): CHOL, HDL, LDLCALC, TRIG, CHOLHDL, LDLDIRECT in the last 8760 hours.  Past Procedures:01/16/13 CT head Wo Contrast:   IMPRESSION:  Atrophy and chronic microvascular ischemia. No acute abnormality   09/25/13 CT head, cervical spine, maxillary bones:   IMPRESSION:  No acute intracranial abnormality. Chronic and involutional changes  appreciated.  Left periorbital hematoma. No evidence of acute fracture nor  dislocation. Findings which may represent sinus disease within the  ethmoid air cells.  Multilevel spondylosis within the cervical spine without acute  osseous abnormalities.  01/27/14 CXR  IMPRESSION:  Displaced right posterior seventh rib fracture with right apical  pneumothorax. Right base infiltrate. Underlying emphysema. Left lung  clear.  01/27/14 CT head and cervical spine:  IMPRESSION:  CT head: Diffuse opacification of most of the left mastoid air cells  as well as much of the left external auditory canal and essentially  the entire left middle ear region. Question traumatic versus  inflammatory etiology. No fracture or air-fluid level is seen in  this region. Mastoids and external auditory canal/middle ear on the  right are clear.  Atrophy with patchy small vessel disease, stable. No intracranial  mass, hemorrhage, or extra-axial fluid.  There is a left frontal  scalp hematoma.  CT cervical spine: Incomplete fracture of the inferior aspect of the  odontoid, type III. Alignment anatomic. No other fractures.  Extensive arthropathy. Areas of spondylolisthesis appear stable  under felt to be due to underlying spondylosis. There is a sizable  free-flowing right pleural effusion.  01/28/14 CXR  IMPRESSION:  No or pneumothorax.   Assessment/Plan Progressive supranuclear palsy 03/22/14 Neurology: f/u 6 mos. May ST MBS if swallow became a significant problem.  10/05/14 Neurology: continue ASA 81mg , does not report swallowing difficulty at this time, make sure she is well hydrated.   Dysphagia, oropharyngeal phase Modify diet. Risk for aspiration.    UTI (lower urinary tract infection) 10/05/13 urine culture: K. Pneumoniae Septra DS bid x 7days.  09/09/14 urine culture lactobacillus: Augmentin 875mg  bid x 7 days.  10/19/14 asymptomatic.   Senile degeneration of brain Noted on CT and MRI of the brain 10/14/09  Gait disorder Started June 2011. Patient has cerebral atrophy, cerebrovascular disease, and supranuclear palsy. Frequent falling. wc for mobility    Lethargy Resolved after UTI was fully treated 08/2014. She is in her usual state of health    Family/ Staff Communication: observe the patient.   Goals of Care: SNF  Labs/tests ordered: none

## 2014-10-19 NOTE — Assessment & Plan Note (Signed)
10/05/13 urine culture: K. Pneumoniae Septra DS bid x 7days.  09/09/14 urine culture lactobacillus: Augmentin 875mg  bid x 7 days.  10/19/14 asymptomatic.

## 2014-10-19 NOTE — Assessment & Plan Note (Signed)
Resolved after UTI was fully treated 08/2014. She is in her usual state of health

## 2014-10-19 NOTE — Assessment & Plan Note (Signed)
Noted on CT and MRI of the brain 10/14/09

## 2014-10-19 NOTE — Assessment & Plan Note (Signed)
Started June 2011. Patient has cerebral atrophy, cerebrovascular disease, and supranuclear palsy. Frequent falling. wc for mobility

## 2014-12-10 IMAGING — CT CT CERVICAL SPINE W/O CM
3 of 4 series · 12 of 27 positions shown, 14 images · non-contrast
Comparison: September 25, 2013

ADDENDUM:
The linear area in the base of the odontoid is quite subtle and
potentially could represent vascular prominence as opposed to a
fracture. Clinical assessment of this area advised with respect to
this subtle finding.
CLINICAL DATA: Patient fell earlier today, currently presenting
with headache, neck pain, and hematoma over left bony calvarium
appears intact. Mastoids on the right are clear. There is diffuse
opacification of the mastoids on the left. There is also extensive
opacity throughout the left external and Frontal region

EXAM:
CT HEAD WITHOUT CONTRAST
CT CERVICAL SPINE WITHOUT CONTRAST
TECHNIQUE: Multidetector CT imaging of the head and cervical spine was
performed following the standard protocol without intravenous
contrast. Multiplanar CT image reconstructions of the cervical spine
were also generated.

[Series 4: bone windows · axial · 0.43mm/px · z∈[+1446,+1516]mm · 3 of 71 slices shown]
[im 18/71  bone]
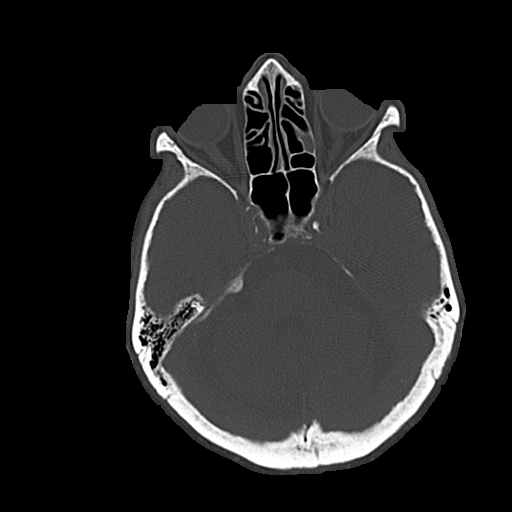
[im 36/71  bone]
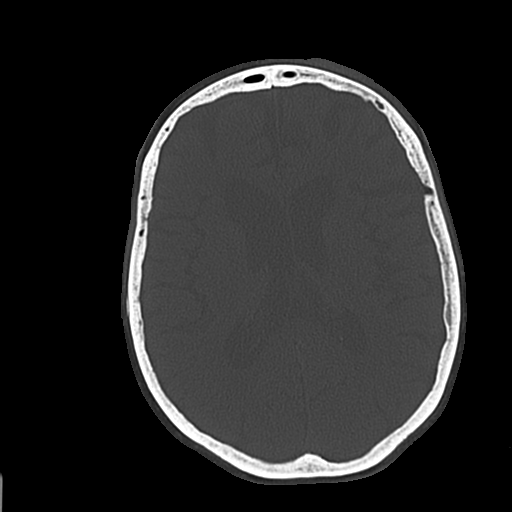
[im 53/71  bone]
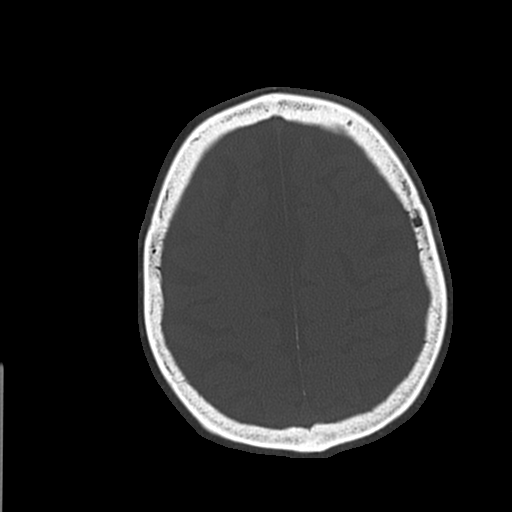

[Series 5: c-spine st · axial · 0.41mm/px · z∈[+1306,+1402]mm · 4 of 81 slices shown, 5 images]
[im 17/81  soft-tissue]
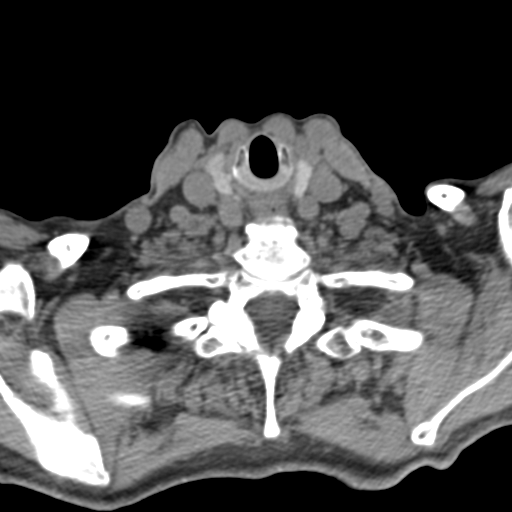
[im 17/81  bone]
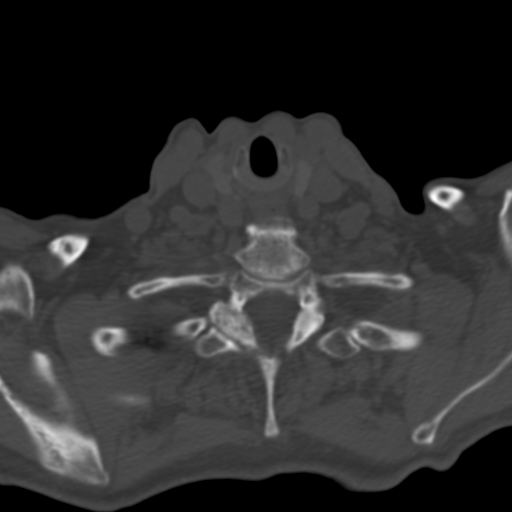
[im 33/81  bone]
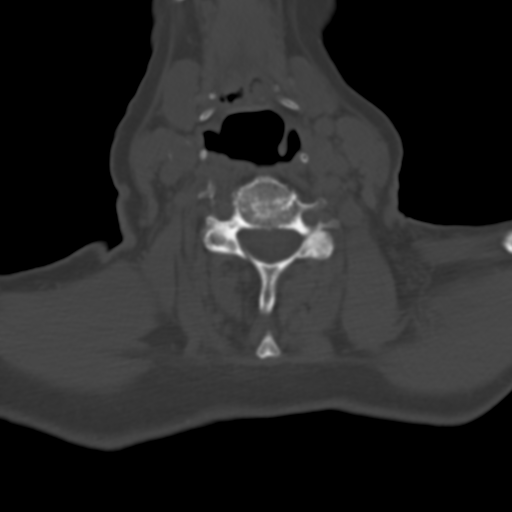
[im 49/81  bone]
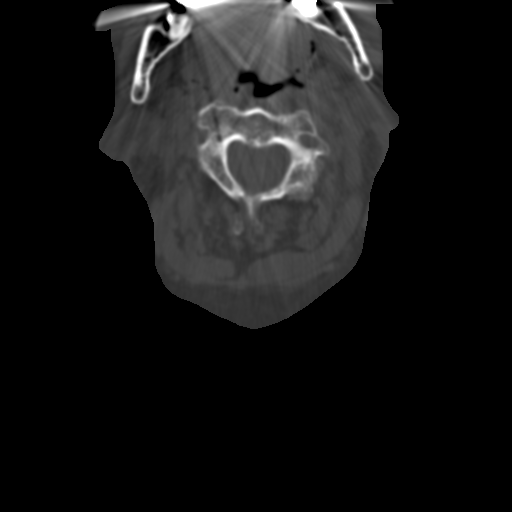
[im 65/81  bone]
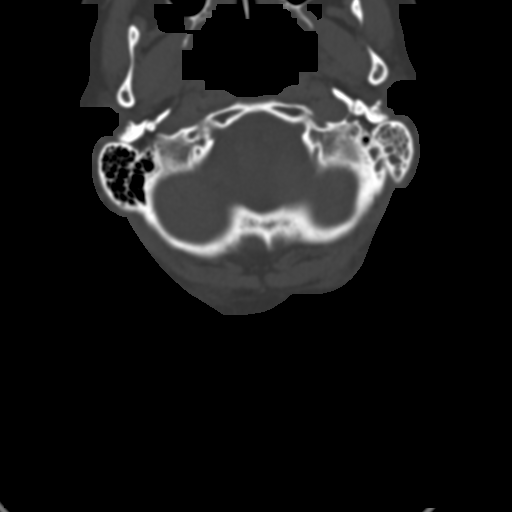

[Series 604: <mpr thick range(2)> · sagittal · 0.40mm/px · 5 of 46 slices shown, 6 images]
[im 16/46  bone]
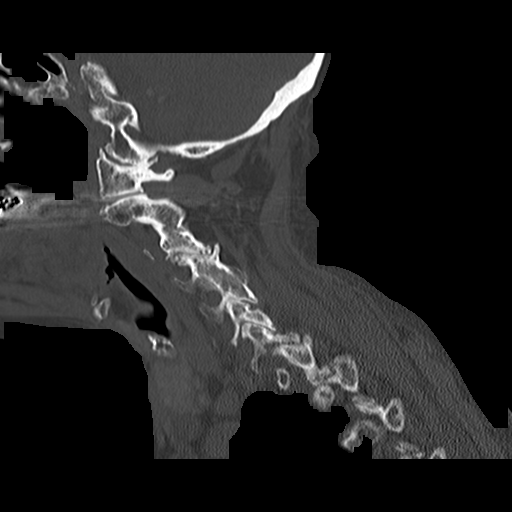
[im 19/46  bone]
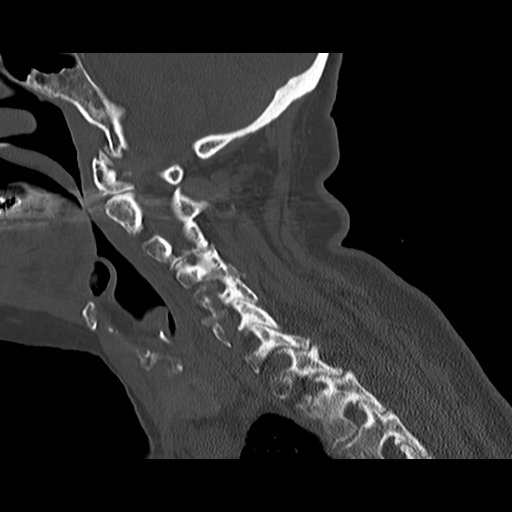
[im 23/46  soft-tissue]
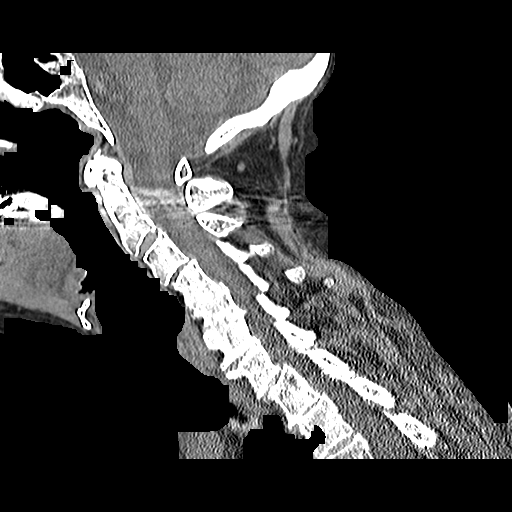
[im 23/46  bone]
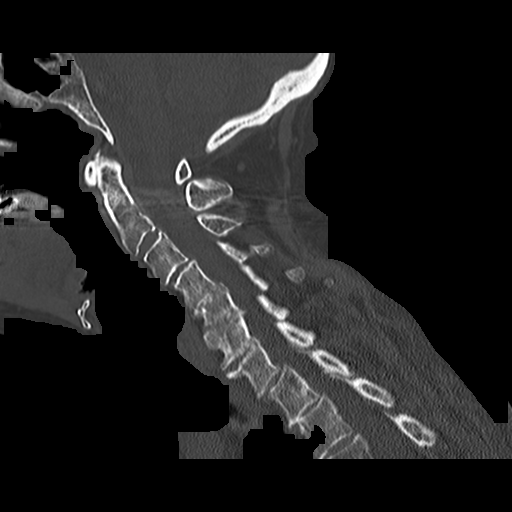
[im 27/46  bone]
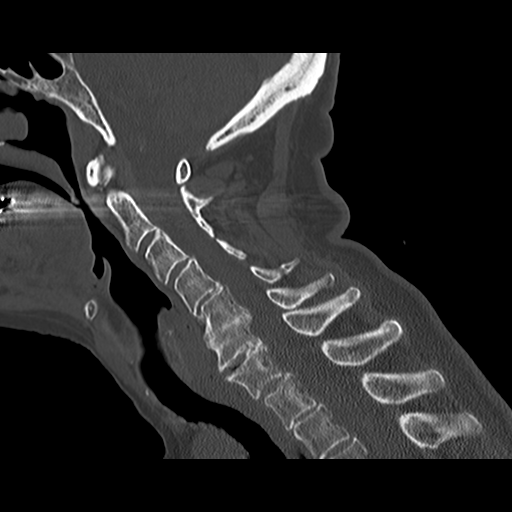
[im 31/46  bone]
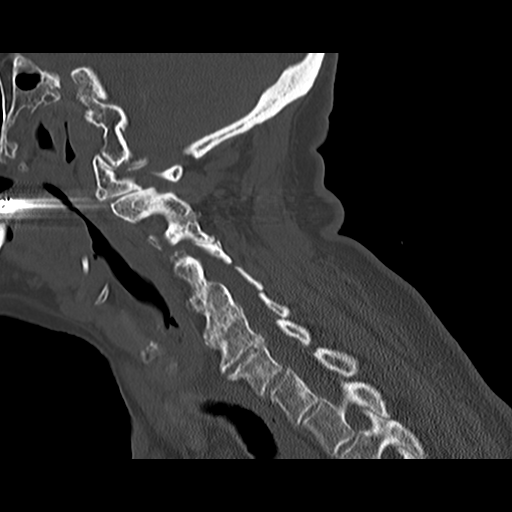

[12 of 27 positions shown; findings below may reference images not displayed]

FINDINGS: CT HEAD FINDINGS

Moderate diffuse atrophy is stable. There is no mass, hemorrhage,
extra-axial fluid collection, or midline shift. There is mild patchy
small vessel disease in the centra semiovale bilaterally. There is
no new gray-white compartment lesion. No acute infarct.

There is a scalp hematoma over the left frontal region. The bony
calvarium appears intact. Mastoids on the right are clear. On the
left, there is diffuse opacification of the left mastoid air cells
as well as much of the left external auditory canal in essentially
all left middle ear.

CT CERVICAL SPINE FINDINGS

There is an incomplete fracture along the proximal aspect of the
odontoid, best seen on coronal slice 17 series 602 and sagittal
slice 26 series 604. This finding is more subtle on axial slice 27
series 603. There is no other apparent fracture. There is slight
anterolisthesis of C4 on C5 with slight retrolisthesis of C5 on C6
and slight anterolisthesis of C7 on T1, stable findings felt to be
due to underlying spondylosis. Prevertebral soft tissues and
predental space regions are normal. There is marked disc space
narrowing at C4-5, C5-6, and C6-7. There is multilevel facet
arthropathy, tending to be more severe overall on the right than on
the left at several levels. No frank disc extrusion or stenosis is
seen. There are foci of carotid artery calcification bilaterally.

There is a sizable pleural effusion on the right which appears
free-flowing.
IMPRESSION: CT head: Diffuse opacification of most of the left mastoid air cells
as well as much of the left external auditory canal and essentially
the entire left middle ear region. Question traumatic versus
inflammatory etiology. No fracture or air-fluid level is seen in
this region. Mastoids and external auditory canal/middle ear on the
right are clear.

Atrophy with patchy small vessel disease, stable. No intracranial
mass, hemorrhage, or extra-axial fluid. There is a left frontal
scalp hematoma.

CT cervical spine: Incomplete fracture of the inferior aspect of the
odontoid, type III. Alignment anatomic. No other fractures.
Extensive arthropathy. Areas of spondylolisthesis appear stable
under felt to be due to underlying spondylosis. There is a sizable
free-flowing right pleural effusion.

Critical Value/emergent results were called by telephone at the time
of interpretation on 01/27/2014 at [DATE] to Dr. EULALIA ROBILLARD ,
who verbally acknowledged these results.

## 2014-12-21 ENCOUNTER — Non-Acute Institutional Stay (SKILLED_NURSING_FACILITY): Payer: Medicare Other | Admitting: Nurse Practitioner

## 2014-12-21 ENCOUNTER — Encounter: Payer: Self-pay | Admitting: Nurse Practitioner

## 2014-12-21 DIAGNOSIS — G231 Progressive supranuclear ophthalmoplegia [Steele-Richardson-Olszewski]: Secondary | ICD-10-CM

## 2014-12-21 DIAGNOSIS — G311 Senile degeneration of brain, not elsewhere classified: Secondary | ICD-10-CM

## 2014-12-21 DIAGNOSIS — R1312 Dysphagia, oropharyngeal phase: Secondary | ICD-10-CM | POA: Diagnosis not present

## 2014-12-21 NOTE — Assessment & Plan Note (Signed)
Noted on CT and MRI of the brain 10/14/09  

## 2014-12-21 NOTE — Assessment & Plan Note (Signed)
Modify diet. Risk for aspiration.    

## 2014-12-21 NOTE — Assessment & Plan Note (Signed)
10/05/14 Neurology: continue ASA 81mg , does not report swallowing difficulty at this time, make sure she is well hydrated.

## 2014-12-21 NOTE — Progress Notes (Signed)
Patient ID: Monica Waller, female   DOB: 1923-11-02, 81 y.o.   MRN: [id number]  Location:  SNF [facility] Provider:  Marlana Latus NP  Code Status:  DNR Goals of care: Advanced Directive information    Chief Complaint  Patient presents with  . Medical Management of Chronic Issues     HPI: Patient is a 81 y.o. female seen in the SNF at [facility] today for evaluation of chronic medical conditions. Long standing Hx of supranuclear palsy, gradual decline that requires SNF for care needs. Dysphagia, risk for aspiration, close supervision needed.   Review of Systems:  Review of Systems  HENT: Positive for hearing loss. Negative for ear pain.   Respiratory: Negative.   Cardiovascular: Negative for chest pain, palpitations and leg swelling.  Gastrointestinal: Negative.   Genitourinary: Negative.   Musculoskeletal:       Unstable gait. History of multiple falls.  Skin:       History right mastectomy for breast cancer   Neurological:       Progressive supranuclear palsy. Ataxia. Unstable gait. Clumsy right leg and foot.  Endo/Heme/Allergies: Negative for polydipsia.  Psychiatric/Behavioral: Negative.     Past Medical History  Diagnosis Date  . Cerebral degeneration   . Ataxia   . Gait disorder   . Progressive supranuclear palsy   . Subdural hematoma     Hx. of a left frontal  . 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 Other generalized ischemic cerebrovascular disease 10/14/09    Noted on CT and MR brain 10/14/09  . Dysphagia, oropharyngeal phase   . Cancer     Breast  . Malignant neoplasm of breast (female), unspecified site   . Central retinal artery occlusion 12/23/2013    Left eye  . Supranuclear palsy     Patient Active Problem List   Diagnosis Date Noted  . Lethargy 09/07/2014  . Contusion of right orbital tissues 07/13/2014  . C1 cervical fracture 02/04/2014  . Right rib fracture 02/04/2014  . DNR (do not resuscitate)  02/02/2014  . Pneumothorax 01/27/2014  . Central retinal artery occlusion, left eye 12/25/2013  . Blurred vision 12/15/2013  . Dysphagia, oropharyngeal phase   . Malignant neoplasm of breast (female), unspecified site   . Gait disorder   . UTI (lower urinary tract infection) 01/18/2013  . Fall 01/18/2013  . Anemia 06/04/2012  . Abnormality of gait 06/04/2012  . Progressive supranuclear palsy 06/04/2012  . Other generalized ischemic cerebrovascular disease 10/14/2009  . 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   ASPIRIN 81 MG TABLET    Take 81 mg by mouth daily.   CALCIUM CARB-CHOLECALCIFEROL (CALTRATE 600+D) 600-800 MG-UNIT TABS    Take 1 tablet by mouth.   MULTIVITAMIN-IRON-MINERALS-FOLIC ACID (CENTRUM) CHEWABLE TABLET    Chew 1 tablet by mouth daily.   POLYVINYL ALCOHOL (ARTIFICIAL TEARS) 1.4 % OPHTHALMIC SOLUTION    1 drop as needed for dry eyes. *wait 2-5 minutes between eye drops , twice daily   SENNA (SENOKOT) 8.6 MG TABLET    Take 1 tablet by mouth 2 (two) times daily.  Modified Medications   No medications on file  Discontinued Medications  No medications on file    Physical Exam: Filed Vitals:   12/21/14 1735  BP: 130/62  Pulse: 64  Temp: 98 F (36.7 C)  TempSrc: Tympanic  Resp: 20   There is no weight on file to calculate BMI.  Physical Exam  Constitutional: She is oriented to person, place, and time. She appears well-developed and well-nourished. No distress.  HENT:  Head: Normocephalic.  Right Ear: External ear normal.  Left Ear: External ear normal.  Nose: Nose normal.  Mouth/Throat: Oropharynx is clear and moist. No oropharyngeal exudate.  Eyes: Conjunctivae are normal. Right eye exhibits no discharge. Left eye  exhibits no discharge. No scleral icterus.  Difficulty looking down followed by an upgaze ?palsy  Neck: Normal range of motion. Neck supple. No JVD present. No tracheal deviation present. No thyromegaly present.  Cardiovascular: Normal rate, regular rhythm, normal heart sounds and intact distal pulses.   No murmur heard. Pulmonary/Chest: Effort normal and breath sounds normal. No stridor. No respiratory distress. She has no wheezes. She has no rales. She exhibits no tenderness.  Abdominal: Soft. Bowel sounds are normal. She exhibits no distension. There is no tenderness. There is no rebound and no guarding.  Musculoskeletal: Normal range of motion. She exhibits no edema or tenderness.  Lymphadenopathy:    She has no cervical adenopathy.  Neurological: She is alert and oriented to person, place, and time. She displays normal reflexes. No cranial nerve deficit. She exhibits normal muscle tone. Coordination abnormal.  Skin: Skin is warm and dry. No rash noted. She is not diaphoretic. No erythema. No pallor.  Psychiatric: She has a normal mood and affect. Her speech is normal and behavior is normal. Judgment and thought content normal. Cognition and memory are impaired. She does not express impulsivity or inappropriate judgment. She exhibits abnormal recent memory. She exhibits normal remote memory.    Labs reviewed: Basic Metabolic Panel:  Recent Labs  01/27/14 1647 09/08/14  NA 138 139  K 4.0 4.2  CL 103  --   CO2 21  --   GLUCOSE 93  --   BUN 26* 24*  CREATININE 0.81 1.0  CALCIUM 9.5  --     Liver Function Tests:  Recent Labs  09/08/14  AST 19  ALT 13  ALKPHOS 63    CBC:  Recent Labs  01/27/14 1647 01/28/14 0500 09/08/14  WBC 8.1 6.6 6.4  HGB 13.4 13.3 12.7  HCT 40.3 38.8 38  MCV 92.0 92.8  --   PLT 273 241 241    Lab Results  Component Value Date   TSH 3.22 09/08/2014   No results found for: HGBA1C Lab Results  Component Value Date   CHOL * 10/14/2009     213        ATP III CLASSIFICATION:  <200     mg/dL   Desirable  200-239  mg/dL   Borderline High  >=240    mg/dL   High          HDL 64 10/14/2009   LDLCALC * 10/14/2009    132        Total Cholesterol/HDL:CHD Risk Coronary Heart Disease Risk Table                     Men   Women  1/2 Average Risk   3.4   3.3  Average Risk       5.0   4.4  2 X Average Risk   9.6   7.1  3 X Average Risk  23.4   11.0        Use the calculated Patient Ratio above and the CHD Risk Table to determine the patient's CHD Risk.        ATP III CLASSIFICATION (LDL):  <100     mg/dL   Optimal  100-129  mg/dL   Near or Above                    Optimal  130-159  mg/dL   Borderline  160-189  mg/dL   High  >190     mg/dL   Very High   TRIG 87 10/14/2009   CHOLHDL 3.3 10/14/2009    Significant Diagnostic Results since last visit: none  Patient Care Team: Estill Dooms, MD as PCP - General (Internal Medicine) Kesler Wickham X, NP as Nurse Practitioner (Nurse Practitioner) [facility] Kathrynn Ducking, MD as Consulting Physician (Neurology) Luberta Mutter, MD as Consulting Physician (Ophthalmology) Kristeen Miss, MD as Consulting Physician (Neurosurgery)  Assessment/Plan Problem List Items Addressed This Visit    Progressive supranuclear palsy - Primary (Chronic)    10/05/14 Neurology: continue ASA 81mg , does not report swallowing difficulty at this time, make sure she is well hydrated.      Dysphagia, oropharyngeal phase (Chronic)    Modify diet. Risk for aspiration.      Senile degeneration of brain    Noted on CT and MRI of the brain 10/14/09          Family/ staff Communication: SNF for care needs.   Labs/tests ordered:  none  ManXie Kristl Morioka NP [facility] [address], [place] [address] On Call:  [phone] & follow prompts after 5pm & weekends Office Phone:  [phone] Office Fax:  [phone]

## 2015-01-03 ENCOUNTER — Encounter: Payer: Self-pay | Admitting: Internal Medicine

## 2015-01-03 ENCOUNTER — Non-Acute Institutional Stay (SKILLED_NURSING_FACILITY): Payer: Medicare Other | Admitting: Internal Medicine

## 2015-01-03 DIAGNOSIS — G231 Progressive supranuclear ophthalmoplegia [Steele-Richardson-Olszewski]: Secondary | ICD-10-CM

## 2015-01-03 DIAGNOSIS — R1312 Dysphagia, oropharyngeal phase: Secondary | ICD-10-CM

## 2015-01-03 NOTE — Progress Notes (Signed)
Patient ID: Monica Waller, female   DOB: 09/27/1923, 81 y.o.   MRN: [id number]    [facility] Room Number: N 18  Place of Service: SNF (31)     No Known Allergies  Chief Complaint  Patient presents with  . Medical Management of Chronic Issues    HPI:  Patient continues with generalized rigidity. There is a history of progressive supranuclear palsy. She also has had dysphagia and continues to manifest mild issues with this problem now.  Patient limits herself to brief responses of about 1 or 2 words, but I believe she can understand everything that is said.  Medications: Patient's Medications  New Prescriptions   No medications on file  Previous Medications   ACETAMINOPHEN (TYLENOL) 325 MG TABLET    Take 2 tablets (650 mg total) by mouth every 6 (six) hours as needed.   ASPIRIN 81 MG TABLET    Take 81 mg by mouth daily.   CALCIUM CARB-CHOLECALCIFEROL (CALTRATE 600+D) 600-800 MG-UNIT TABS    Take 1 tablet by mouth.   MULTIVITAMIN-IRON-MINERALS-FOLIC ACID (CENTRUM) CHEWABLE TABLET    Chew 1 tablet by mouth daily.   POLYVINYL ALCOHOL (ARTIFICIAL TEARS) 1.4 % OPHTHALMIC SOLUTION    1 drop as needed for dry eyes. *wait 2-5 minutes between eye drops , twice daily   SENNA (SENOKOT) 8.6 MG TABLET    Take 1 tablet by mouth 2 (two) times daily.  Modified Medications   No medications on file  Discontinued Medications   No medications on file     Review of Systems  Constitutional:       10. Frail.  HENT: Positive for hearing loss. Negative for ear pain.   Eyes: Negative.   Respiratory: Negative.   Cardiovascular: Negative for chest pain, palpitations and leg swelling.  Gastrointestinal: Negative.   Endocrine: Negative.  Negative for polydipsia.  Genitourinary: Negative.   Musculoskeletal:       Unstable gait. History of multiple falls.  Skin:       History right mastectomy for breast cancer.  Neurological:       Progressive supranuclear palsy.  Ataxia. Unstable gait. Clumsy right leg and foot.  Hematological: Negative.   Psychiatric/Behavioral: Negative.     Filed Vitals:   01/03/15 1532  BP: 118/52  Pulse: 60  Temp: 97.6 F (36.4 C)  Resp: 14  Height: 5' 5"  (1.651 m)  Weight: 119 lb (53.978 kg)  SpO2: 96%   Body mass index is 19.8 kg/(m^2).  Physical Exam  Constitutional: She is oriented to person, place, and time. She appears well-developed and well-nourished. No distress.  HENT:  Head: Normocephalic.  Right Ear: External ear normal.  Left Ear: External ear normal.  Nose: Nose normal.  Mouth/Throat: Oropharynx is clear and moist. No oropharyngeal exudate.  Eyes: Conjunctivae are normal. Right eye exhibits no discharge. Left eye exhibits no discharge. No scleral icterus.  Difficulty looking down followed by an upgaze ?palsy  Neck: Normal range of motion. Neck supple. No JVD present. No tracheal deviation present. No thyromegaly present.  Cardiovascular: Normal rate, regular rhythm, normal heart sounds and intact distal pulses.   No murmur heard. Pulmonary/Chest: Effort normal and breath sounds normal. No stridor. No respiratory distress. She has no wheezes. She has no rales. She exhibits no tenderness.  Abdominal: Soft. Bowel sounds are normal. She exhibits no distension. There is no tenderness. There is no rebound and no guarding.  Musculoskeletal: Normal range of motion. She exhibits no edema  or tenderness.  Lymphadenopathy:    She has no cervical adenopathy.  Neurological: She is alert and oriented to person, place, and time. She displays normal reflexes. No cranial nerve deficit. She exhibits normal muscle tone. Coordination abnormal.  Skin: Skin is warm and dry. No rash noted. She is not diaphoretic. No erythema. No pallor.  Psychiatric: She has a normal mood and affect. Her speech is normal and behavior is normal. Judgment and thought content normal. Cognition and memory are impaired. She does not express  impulsivity or inappropriate judgment. She exhibits abnormal recent memory. She exhibits normal remote memory.     Labs reviewed: Lab Summary Latest Ref Rng 09/08/2014 01/28/2014 01/27/2014  Hemoglobin 12.0 - 16.0 g/dL 12.7 13.3 13.4  Hematocrit 36 - 46 % 38 38.8 40.3  White count - 6.4 6.6 8.1  Platelet count 150 - 399 K/L 241 241 273  Sodium 137 - 147 mmol/L 139 (None) 138  Potassium 3.4 - 5.3 mmol/L 4.2 (None) 4.0  Calcium 8.4 - 10.5 mg/dL (None) (None) 9.5  Phosphorus - (None) (None) (None)  Creatinine 0.5 - 1.1 mg/dL 1.0 (None) 0.81  AST 13 - 35 U/L 19 (None) (None)  Alk Phos 25 - 125 U/L 63 (None) (None)  Bilirubin - (None) (None) (None)  Glucose - 85 (None) 93  Cholesterol - (None) (None) (None)  HDL cholesterol - (None) (None) (None)  Triglycerides - (None) (None) (None)  LDL Direct - (None) (None) (None)  LDL Calc - (None) (None) (None)  Total protein - (None) (None) (None)  Albumin - (None) (None) (None)   Lab Results  Component Value Date   TSH 3.22 09/08/2014   Lab Results  Component Value Date   BUN 24* 09/08/2014   No results found for: HGBA1C     Assessment/Plan.diagm 1. Progressive supranuclear palsy unchanged  2. Dysphagia, oropharyngeal phase Mild and unchanged

## 2015-01-18 ENCOUNTER — Non-Acute Institutional Stay (SKILLED_NURSING_FACILITY): Payer: Medicare Other | Admitting: Nurse Practitioner

## 2015-01-18 ENCOUNTER — Encounter: Payer: Self-pay | Admitting: Nurse Practitioner

## 2015-01-18 DIAGNOSIS — G311 Senile degeneration of brain, not elsewhere classified: Secondary | ICD-10-CM

## 2015-01-18 DIAGNOSIS — G231 Progressive supranuclear ophthalmoplegia [Steele-Richardson-Olszewski]: Secondary | ICD-10-CM

## 2015-01-18 DIAGNOSIS — R269 Unspecified abnormalities of gait and mobility: Secondary | ICD-10-CM | POA: Diagnosis not present

## 2015-01-18 NOTE — Assessment & Plan Note (Signed)
Started June 2011. Patient has cerebral atrophy, cerebrovascular disease, and supranuclear palsy. Less falling in SNF supervised setting.

## 2015-01-18 NOTE — Assessment & Plan Note (Signed)
10/05/14 Neurology: continue ASA 81mg , does not report swallowing difficulty at this time, make sure she is well hydrated. -- no significant change.

## 2015-01-18 NOTE — Progress Notes (Signed)
Patient ID: Monica Waller, female   DOB: [birth year], 81 y.o.   MRN: [id number]  Location:  SNF [facility] Provider:  Marlana Latus NP  Code Status:  DNR Goals of care: Advanced Directive information    Chief Complaint  Patient presents with  . Medical Management of Chronic Issues     HPI: Patient is a 81 y.o. female seen in the SNF at [facility] today for evaluation of long standing progressive supranuclear palsy along with degenerative brain, poor balance, multiple falls, resulted in SNF for care needs, ADL assistance, f/u Neurology regularly, she is able to swallow modified diet wo difficulty.   Review of Systems:  Review of Systems  Constitutional: Negative for weight loss and malaise/fatigue.  HENT: Positive for hearing loss. Negative for congestion and ear pain.   Eyes: Negative for pain and discharge.  Respiratory: Negative.  Negative for cough and sputum production.   Cardiovascular: Negative for chest pain, palpitations and leg swelling.  Gastrointestinal: Negative.  Negative for abdominal pain and constipation.  Genitourinary: Negative.  Negative for dysuria.  Musculoskeletal: Negative for back pain, joint pain and neck pain.       Unstable gait. History of multiple falls.  Skin:       History right mastectomy for breast cancer   Neurological: Negative for dizziness, tremors, speech change, loss of consciousness and weakness.       Progressive supranuclear palsy. Ataxia. Unstable gait. Clumsy right leg and foot.  Endo/Heme/Allergies: Negative for polydipsia.  Psychiatric/Behavioral: Positive for memory loss. Negative for depression. The patient is not nervous/anxious and does not have insomnia.     Past Medical History  Diagnosis Date  . Cerebral degeneration   . Ataxia   . Gait disorder   . Progressive supranuclear palsy   . Subdural hematoma     Hx. of a left frontal  . 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 Other  generalized ischemic cerebrovascular disease 10/14/09    Noted on CT and MR brain 10/14/09  . Dysphagia, oropharyngeal phase   . Cancer     Breast  . Malignant neoplasm of breast (female), unspecified site   . Central retinal artery occlusion 12/23/2013    Left eye  . Supranuclear palsy     Patient Active Problem List   Diagnosis Date Noted  . C1 cervical fracture 02/04/2014  . DNR (do not resuscitate) 02/02/2014  . Central retinal artery occlusion, left eye 12/25/2013  . Dysphagia, oropharyngeal phase   . Malignant neoplasm of breast (female), unspecified site   . Gait disorder   . UTI (lower urinary tract infection) 01/18/2013  . Fall 01/18/2013  . Anemia 06/04/2012  . Abnormality of gait 06/04/2012  . Progressive supranuclear palsy 06/04/2012  . Other generalized ischemic cerebrovascular disease 10/14/2009  . 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   ASPIRIN 81 MG TABLET    Take 81 mg by mouth daily.   CALCIUM CARB-CHOLECALCIFEROL (CALTRATE 600+D) 600-800 MG-UNIT TABS    Take 1 tablet by mouth.   MULTIVITAMIN-IRON-MINERALS-FOLIC ACID (CENTRUM) CHEWABLE TABLET    Chew 1 tablet by mouth daily.   POLYVINYL ALCOHOL (ARTIFICIAL TEARS) 1.4 % OPHTHALMIC SOLUTION  1 drop as needed for dry eyes. *wait 2-5 minutes between eye drops , twice daily   SENNA (SENOKOT) 8.6 MG TABLET    Take 1 tablet by mouth 2 (two) times daily.  Modified Medications   No medications on file  Discontinued Medications   No medications on file    Physical Exam: Filed Vitals:   01/18/15 1349  BP: 112/75  Pulse: 62  Temp: 97.9 F (36.6 C)  TempSrc: Tympanic  Resp: 18   There is no weight on file to calculate BMI.  Physical Exam  Constitutional: She is oriented  to person, place, and time. She appears well-developed and well-nourished. No distress.  HENT:  Head: Normocephalic.  Right Ear: External ear normal.  Left Ear: External ear normal.  Nose: Nose normal.  Mouth/Throat: Oropharynx is clear and moist. No oropharyngeal exudate.  Eyes: Conjunctivae are normal. Right eye exhibits no discharge. Left eye exhibits no discharge. No scleral icterus.  Difficulty looking down followed by an upgaze ?palsy  Neck: Normal range of motion. Neck supple. No JVD present. No tracheal deviation present. No thyromegaly present.  Cardiovascular: Normal rate, regular rhythm, normal heart sounds and intact distal pulses.   No murmur heard. Pulmonary/Chest: Effort normal and breath sounds normal. She has no wheezes. She has no rales. She exhibits no tenderness.  Abdominal: Soft. Bowel sounds are normal. She exhibits no distension. There is no tenderness.  Musculoskeletal: Normal range of motion. She exhibits no edema or tenderness.  Stiffness limbs  Neurological: She is alert and oriented to person, place, and time. She displays normal reflexes. No cranial nerve deficit. She exhibits normal muscle tone. Coordination abnormal.  Skin: Skin is warm and dry. No rash noted. She is not diaphoretic. No erythema. No pallor.  Psychiatric: She has a normal mood and affect. Her speech is normal and behavior is normal. Judgment and thought content normal. Cognition and memory are impaired. She does not express impulsivity or inappropriate judgment. She exhibits abnormal recent memory. She exhibits normal remote memory.    Labs reviewed: Basic Metabolic Panel:  Recent Labs  01/27/14 1647 09/08/14  NA 138 139  K 4.0 4.2  CL 103  --   CO2 21  --   GLUCOSE 93  --   BUN 26* 24*  CREATININE 0.81 1.0  CALCIUM 9.5  --     Liver Function Tests:  Recent Labs  09/08/14  AST 19  ALT 13  ALKPHOS 63    CBC:  Recent Labs  01/27/14 1647 01/28/14 0500 09/08/14  WBC 8.1  6.6 6.4  HGB 13.4 13.3 12.7  HCT 40.3 38.8 38  MCV 92.0 92.8  --   PLT 273 241 241    Lab Results  Component Value Date   TSH 3.22 09/08/2014   No results found for: HGBA1C Lab Results  Component Value Date   CHOL * 10/14/2009    213        ATP III CLASSIFICATION:  <200     mg/dL   Desirable  200-239  mg/dL   Borderline High  >=240    mg/dL   High          HDL 64 10/14/2009   LDLCALC * 10/14/2009    132        Total Cholesterol/HDL:CHD Risk Coronary Heart Disease Risk Table                     Men   Women  1/2 Average Risk  3.4   3.3  Average Risk       5.0   4.4  2 X Average Risk   9.6   7.1  3 X Average Risk  23.4   11.0        Use the calculated Patient Ratio above and the CHD Risk Table to determine the patient's CHD Risk.        ATP III CLASSIFICATION (LDL):  <100     mg/dL   Optimal  100-129  mg/dL   Near or Above                    Optimal  130-159  mg/dL   Borderline  160-189  mg/dL   High  >190     mg/dL   Very High   TRIG 87 10/14/2009   CHOLHDL 3.3 10/14/2009    Significant Diagnostic Results since last visit: none  Patient Care Team: Estill Dooms, MD as PCP - General (Internal Medicine) Man Mast X, NP as Nurse Practitioner (Nurse Practitioner) [facility] Kathrynn Ducking, MD as Consulting Physician (Neurology) Luberta Mutter, MD as Consulting Physician (Ophthalmology) Kristeen Miss, MD as Consulting Physician (Neurosurgery)  Assessment/Plan Problem List Items Addressed This Visit    Senile degeneration of brain - Primary    Noted on CT and MRI of the brain 10/14/09, requires more assistance with ADLs in SNF       Progressive supranuclear palsy (Chronic)    10/05/14 Neurology: continue ASA 81mg , does not report swallowing difficulty at this time, make sure she is well hydrated. -- no significant change.       Gait disorder    Started June 2011. Patient has cerebral atrophy, cerebrovascular disease, and supranuclear palsy. Less  falling in SNF supervised setting.           Family/ staff Communication: continue SNF for care needs  Labs/tests ordered:  none  [facility] Mast NP [facility] [address], [place] [address] On Call:  [phone] & follow prompts after 5pm & weekends Office Phone:  [phone] Office Fax:  [phone]

## 2015-01-18 NOTE — Assessment & Plan Note (Signed)
Noted on CT and MRI of the brain 10/14/09, requires more assistance with ADLs in SNF

## 2015-02-18 ENCOUNTER — Non-Acute Institutional Stay (SKILLED_NURSING_FACILITY): Payer: Medicare Other | Admitting: Nurse Practitioner

## 2015-02-18 ENCOUNTER — Encounter: Payer: Self-pay | Admitting: Nurse Practitioner

## 2015-02-18 DIAGNOSIS — G311 Senile degeneration of brain, not elsewhere classified: Secondary | ICD-10-CM | POA: Diagnosis not present

## 2015-02-18 DIAGNOSIS — R1312 Dysphagia, oropharyngeal phase: Secondary | ICD-10-CM

## 2015-02-18 DIAGNOSIS — R269 Unspecified abnormalities of gait and mobility: Secondary | ICD-10-CM | POA: Diagnosis not present

## 2015-02-18 DIAGNOSIS — G231 Progressive supranuclear ophthalmoplegia [Steele-Richardson-Olszewski]: Secondary | ICD-10-CM | POA: Diagnosis not present

## 2015-02-18 NOTE — Assessment & Plan Note (Signed)
Started June 2011. Patient has cerebral atrophy, cerebrovascular disease, and supranuclear palsy. Less falling in SNF supervised setting.

## 2015-02-18 NOTE — Assessment & Plan Note (Signed)
Modify diet. Risk for aspiration.    

## 2015-02-18 NOTE — Assessment & Plan Note (Signed)
Noted on CT and MRI of the brain 10/14/09, requires more assistance with ADLs in SNF

## 2015-02-18 NOTE — Assessment & Plan Note (Signed)
10/05/14 Neurology: continue ASA 81mg , does not report swallowing difficulty at this time, make sure she is well hydrated. -- no significant change.

## 2015-02-18 NOTE — Progress Notes (Signed)
Patient ID: Monica Waller, female   DOB: Apr 23, 1924, 81 y.o.   MRN: [id number]  Location:  SNF [facility] Provider:  Marlana Latus NP  Code Status:  DNR Goals of care: Advanced Directive information    Chief Complaint  Patient presents with  . Medical Management of Chronic Issues     HPI: Patient is a 81 y.o. female seen in the SNF at [facility] today for evaluation of long standing progressive supranuclear palsy along with degenerative brain, poor balance, multiple falls, SNF for ADLs, f/u Neurology regularly, she is able to swallow modified diet wo difficulty.   Review of Systems:  Review of Systems  Constitutional: Negative for weight loss and malaise/fatigue.  HENT: Positive for hearing loss. Negative for congestion and ear pain.   Eyes: Negative for pain and discharge.  Respiratory: Negative.  Negative for cough and sputum production.   Cardiovascular: Negative for chest pain, palpitations and leg swelling.  Gastrointestinal: Negative.  Negative for abdominal pain and constipation.  Genitourinary: Negative.  Negative for dysuria.  Musculoskeletal: Negative for back pain, joint pain and neck pain.       Unstable gait. History of multiple falls.  Skin:       History right mastectomy for breast cancer   Neurological: Negative for dizziness, tremors, speech change, loss of consciousness and weakness.       Progressive supranuclear palsy. Ataxia. Unstable gait. Clumsy right leg and foot.  Endo/Heme/Allergies: Negative for polydipsia.  Psychiatric/Behavioral: Positive for memory loss. Negative for depression. The patient is not nervous/anxious and does not have insomnia.     Past Medical History  Diagnosis Date  . Cerebral degeneration   . Ataxia   . Gait disorder   . Progressive supranuclear palsy ([place])   . Subdural hematoma (HCC)     Hx. of a left frontal  . 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 Other generalized ischemic  cerebrovascular disease 10/14/09    Noted on CT and MR brain 10/14/09  . Dysphagia, oropharyngeal phase   . Cancer ([place])     Breast  . Malignant neoplasm of breast (female), unspecified site   . Central retinal artery occlusion 12/23/2013    Left eye  . Supranuclear palsy     Patient Active Problem List   Diagnosis Date Noted  . C1 cervical fracture ([place]) 02/04/2014  . DNR (do not resuscitate) 02/02/2014  . Central retinal artery occlusion, left eye 12/25/2013  . Dysphagia, oropharyngeal phase   . Malignant neoplasm of breast (female), unspecified site   . Gait disorder   . UTI (lower urinary tract infection) 01/18/2013  . Fall 01/18/2013  . Anemia 06/04/2012  . Abnormality of gait 06/04/2012  . Progressive supranuclear palsy ([place]) 06/04/2012  . Other generalized ischemic cerebrovascular disease 10/14/2009  . 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   ASPIRIN 81 MG TABLET    Take 81 mg by mouth daily.   CALCIUM CARB-CHOLECALCIFEROL (CALTRATE 600+D) 600-800 MG-UNIT TABS    Take 1 tablet by mouth.   MULTIVITAMIN-IRON-MINERALS-FOLIC ACID (CENTRUM) CHEWABLE TABLET    Chew 1 tablet by mouth daily.   POLYVINYL ALCOHOL (ARTIFICIAL TEARS) 1.4 % OPHTHALMIC SOLUTION  1 drop as needed for dry eyes. *wait 2-5 minutes between eye drops , twice daily   SENNA (SENOKOT) 8.6 MG TABLET    Take 1 tablet by mouth 2 (two) times daily.  Modified Medications   No medications on file  Discontinued Medications   No medications on file    Physical Exam: Filed Vitals:   02/18/15 1214  BP: 114/62  Pulse: 84  Temp: 97.8 F (36.6 C)  TempSrc: Tympanic  Resp: 20   There is no weight on file to calculate BMI.  Physical Exam  Constitutional: She is oriented to  person, place, and time. She appears well-developed and well-nourished. No distress.  HENT:  Head: Normocephalic.  Right Ear: External ear normal.  Left Ear: External ear normal.  Nose: Nose normal.  Mouth/Throat: Oropharynx is clear and moist. No oropharyngeal exudate.  Eyes: Conjunctivae are normal. Right eye exhibits no discharge. Left eye exhibits no discharge. No scleral icterus.  Difficulty looking down followed by an upgaze ?palsy  Neck: Normal range of motion. Neck supple. No JVD present. No tracheal deviation present. No thyromegaly present.  Cardiovascular: Normal rate, regular rhythm, normal heart sounds and intact distal pulses.   No murmur heard. Pulmonary/Chest: Effort normal and breath sounds normal. She has no wheezes. She has no rales. She exhibits no tenderness.  Abdominal: Soft. Bowel sounds are normal. She exhibits no distension. There is no tenderness.  Musculoskeletal: Normal range of motion. She exhibits no edema or tenderness.  Stiffness limbs  Neurological: She is alert and oriented to person, place, and time. She displays normal reflexes. No cranial nerve deficit. She exhibits normal muscle tone. Coordination abnormal.  Skin: Skin is warm and dry. No rash noted. She is not diaphoretic. No erythema. No pallor.  Psychiatric: She has a normal mood and affect. Her speech is normal and behavior is normal. Judgment and thought content normal. Cognition and memory are impaired. She does not express impulsivity or inappropriate judgment. She exhibits abnormal recent memory. She exhibits normal remote memory.    Labs reviewed: Basic Metabolic Panel:  Recent Labs  09/08/14  NA 139  K 4.2  BUN 24*  CREATININE 1.0    Liver Function Tests:  Recent Labs  09/08/14  AST 19  ALT 13  ALKPHOS 63    CBC:  Recent Labs  09/08/14  WBC 6.4  HGB 12.7  HCT 38  PLT 241    Lab Results  Component Value Date   TSH 3.22 09/08/2014   No results found for: HGBA1C Lab  Results  Component Value Date   CHOL * 10/14/2009    213        ATP III CLASSIFICATION:  <200     mg/dL   Desirable  200-239  mg/dL   Borderline High  >=240    mg/dL   High          HDL 64 10/14/2009   LDLCALC * 10/14/2009    132        Total Cholesterol/HDL:CHD Risk Coronary Heart Disease Risk Table                     Men   Women  1/2 Average Risk   3.4   3.3  Average Risk       5.0   4.4  2 X Average Risk   9.6   7.1  3 X Average Risk  23.4   11.0        Use the  calculated Patient Ratio above and the CHD Risk Table to determine the patient's CHD Risk.        ATP III CLASSIFICATION (LDL):  <100     mg/dL   Optimal  100-129  mg/dL   Near or Above                    Optimal  130-159  mg/dL   Borderline  160-189  mg/dL   High  >190     mg/dL   Very High   TRIG 87 10/14/2009   CHOLHDL 3.3 10/14/2009    Significant Diagnostic Results since last visit: none  Patient Care Team: Estill Dooms, MD as PCP - General (Internal Medicine) Demonte Dobratz X, NP as Nurse Practitioner (Nurse Practitioner) [facility] Kathrynn Ducking, MD as Consulting Physician (Neurology) Luberta Mutter, MD as Consulting Physician (Ophthalmology) Kristeen Miss, MD as Consulting Physician (Neurosurgery)  Assessment/Plan Problem List Items Addressed This Visit    Dysphagia, oropharyngeal phase - Primary (Chronic)    Modify diet. Risk for aspiration.      Gait disorder    Started June 2011. Patient has cerebral atrophy, cerebrovascular disease, and supranuclear palsy. Less falling in SNF supervised setting.       Progressive supranuclear palsy ([place]) (Chronic)    10/05/14 Neurology: continue ASA 81mg , does not report swallowing difficulty at this time, make sure she is well hydrated. -- no significant change.       Senile degeneration of brain    Noted on CT and MRI of the brain 10/14/09, requires more assistance with ADLs in SNF           Family/ staff Communication: continue SNF for  care needs  Labs/tests ordered:  none  [facility] Arnett Galindez NP [facility] [address], [place] [address] On Call:  [phone] & follow prompts after 5pm & weekends Office Phone:  [phone] Office Fax:  [phone]

## 2015-03-04 ENCOUNTER — Non-Acute Institutional Stay (SKILLED_NURSING_FACILITY): Payer: Medicare Other | Admitting: Nurse Practitioner

## 2015-03-04 ENCOUNTER — Encounter: Payer: Self-pay | Admitting: Nurse Practitioner

## 2015-03-04 DIAGNOSIS — G311 Senile degeneration of brain, not elsewhere classified: Secondary | ICD-10-CM

## 2015-03-04 DIAGNOSIS — W19XXXA Unspecified fall, initial encounter: Secondary | ICD-10-CM | POA: Diagnosis not present

## 2015-03-04 DIAGNOSIS — G231 Progressive supranuclear ophthalmoplegia [Steele-Richardson-Olszewski]: Secondary | ICD-10-CM

## 2015-03-04 NOTE — Progress Notes (Signed)
Patient ID: Monica Waller, female   DOB: 10/21/1923, 81 y.o.   MRN: [id number]  Location:  SNF [facility] Provider:  Marlana Latus NP  Code Status:  DNR Goals of care: Advanced Directive information    Chief Complaint  Patient presents with  . Medical Management of Chronic Issues  . Acute Visit    forehead contusion sustained from falling.      HPI: Patient is a 81 y.o. female seen in the SNF at [facility] today for evaluation of forehead contusion sustained from falling 03/03/15, Hx of long standing progressive supranuclear palsy along with degenerative brain, poor balance, multiple falls, SNF for ADLs, f/u Neurology regularly, she is able to swallow modified diet wo difficulty.   Review of Systems  Constitutional: Negative for weight loss and malaise/fatigue.  HENT: Positive for hearing loss. Negative for congestion and ear pain.   Eyes: Negative for pain and discharge.  Respiratory: Negative.  Negative for cough and sputum production.   Cardiovascular: Negative for chest pain, palpitations and leg swelling.  Gastrointestinal: Negative.  Negative for abdominal pain and constipation.  Genitourinary: Negative.  Negative for dysuria.  Musculoskeletal: Negative for back pain, joint pain and neck pain.       Unstable gait. History of multiple falls.  Skin:       History right mastectomy for breast cancer Forehead contusion from fall 03/03/15   Neurological: Negative for dizziness, tremors, speech change, loss of consciousness and weakness.       Progressive supranuclear palsy. Ataxia. Unstable gait. Clumsy right leg and foot.  Endo/Heme/Allergies: Negative for polydipsia.  Psychiatric/Behavioral: Positive for memory loss. Negative for depression. The patient is not nervous/anxious and does not have insomnia.     Past Medical History  Diagnosis Date  . Cerebral degeneration   . Ataxia   . Gait disorder   . Progressive supranuclear palsy ([place])   . Subdural hematoma (HCC)     Hx.  of a left frontal  . 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 Other generalized ischemic cerebrovascular disease 10/14/09    Noted on CT and MR brain 10/14/09  . Dysphagia, oropharyngeal phase   . Cancer ([place])     Breast  . Malignant neoplasm of breast (female), unspecified site   . Central retinal artery occlusion 12/23/2013    Left eye  . Supranuclear palsy     Patient Active Problem List   Diagnosis Date Noted  . C1 cervical fracture ([place]) 02/04/2014  . DNR (do not resuscitate) 02/02/2014  . Central retinal artery occlusion, left eye 12/25/2013  . Dysphagia, oropharyngeal phase   . Malignant neoplasm of breast (female), unspecified site   . Gait disorder   . UTI (lower urinary tract infection) 01/18/2013  . Fall 01/18/2013  . Anemia 06/04/2012  . Abnormality of gait 06/04/2012  . Progressive supranuclear palsy ([place]) 06/04/2012  . Other generalized ischemic cerebrovascular disease 10/14/2009  . 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   ASPIRIN 81 MG TABLET    Take 81 mg by mouth daily.   CALCIUM CARB-CHOLECALCIFEROL (CALTRATE 600+D) 600-800 MG-UNIT TABS    Take 1 tablet by mouth.   MULTIVITAMIN-IRON-MINERALS-FOLIC ACID (  CENTRUM) CHEWABLE TABLET    Chew 1 tablet by mouth daily.   POLYVINYL ALCOHOL (ARTIFICIAL TEARS) 1.4 % OPHTHALMIC SOLUTION    1 drop as needed for dry eyes. *wait 2-5 minutes between eye drops , twice daily   SENNA (SENOKOT) 8.6 MG TABLET    Take 1 tablet by mouth 2 (two) times daily.  Modified Medications   No medications on file  Discontinued Medications   No medications on file    Physical Exam: Filed Vitals:   03/04/15 1639  BP: 99/53  Pulse: 80  Temp: 96.9 F (36.1 C)   TempSrc: Tympanic  Resp: 16   There is no weight on file to calculate BMI.  Physical Exam  Constitutional: She is oriented to person, place, and time. She appears well-developed and well-nourished. No distress.  HENT:  Head: Normocephalic.  Right Ear: External ear normal.  Left Ear: External ear normal.  Nose: Nose normal.  Mouth/Throat: Oropharynx is clear and moist. No oropharyngeal exudate.  Eyes: Conjunctivae are normal. Right eye exhibits no discharge. Left eye exhibits no discharge. No scleral icterus.  Difficulty looking down followed by an upgaze ?palsy  Neck: Normal range of motion. Neck supple. No JVD present. No tracheal deviation present. No thyromegaly present.  Cardiovascular: Normal rate, regular rhythm, normal heart sounds and intact distal pulses.   No murmur heard. Pulmonary/Chest: Effort normal and breath sounds normal. She has no wheezes. She has no rales. She exhibits no tenderness.  Abdominal: Soft. Bowel sounds are normal. She exhibits no distension. There is no tenderness.  Musculoskeletal: Normal range of motion. She exhibits no edema or tenderness.  Stiffness limbs  Neurological: She is alert and oriented to person, place, and time. She displays normal reflexes. No cranial nerve deficit. She exhibits normal muscle tone. Coordination abnormal.  Skin: Skin is warm and dry. No rash noted. She is not diaphoretic. No erythema. No pallor.  Forehead bruise/abrasion from fall 03/03/15, no new focal neurological deficits noted.   Psychiatric: She has a normal mood and affect. Her speech is normal and behavior is normal. Judgment and thought content normal. Cognition and memory are impaired. She does not express impulsivity or inappropriate judgment. She exhibits abnormal recent memory. She exhibits normal remote memory.    Labs reviewed: Basic Metabolic Panel:  Recent Labs  09/08/14  NA 139  K 4.2  BUN 24*  CREATININE 1.0    Liver Function Tests:  Recent  Labs  09/08/14  AST 19  ALT 13  ALKPHOS 63    CBC:  Recent Labs  09/08/14  WBC 6.4  HGB 12.7  HCT 38  PLT 241    Lab Results  Component Value Date   TSH 3.22 09/08/2014   No results found for: HGBA1C Lab Results  Component Value Date   CHOL * 10/14/2009    213        ATP III CLASSIFICATION:  <200     mg/dL   Desirable  200-239  mg/dL   Borderline High  >=240    mg/dL   High          HDL 64 10/14/2009   LDLCALC * 10/14/2009    132        Total Cholesterol/HDL:CHD Risk Coronary Heart Disease Risk Table                     Men   Women  1/2 Average Risk   3.4   3.3  Average Risk  5.0   4.4  2 X Average Risk   9.6   7.1  3 X Average Risk  23.4   11.0        Use the calculated Patient Ratio above and the CHD Risk Table to determine the patient's CHD Risk.        ATP III CLASSIFICATION (LDL):  <100     mg/dL   Optimal  100-129  mg/dL   Near or Above                    Optimal  130-159  mg/dL   Borderline  160-189  mg/dL   High  >190     mg/dL   Very High   TRIG 87 10/14/2009   CHOLHDL 3.3 10/14/2009    Significant Diagnostic Results since last visit: none  Patient Care Team: Estill Dooms, MD as PCP - General (Internal Medicine) Man Mast X, NP as Nurse Practitioner (Nurse Practitioner) [facility] Kathrynn Ducking, MD as Consulting Physician (Neurology) Luberta Mutter, MD as Consulting Physician (Ophthalmology) Kristeen Miss, MD as Consulting Physician (Neurosurgery)  Assessment/Plan Problem List Items Addressed This Visit    Progressive supranuclear palsy [facility]) - Primary (Chronic)    10/05/14 Neurology: continue ASA 81mg , does not report swallowing difficulty at this time, make sure she is well hydrated. -- no significant change.       Fall    Frequent falling, lack of safety awareness, increased frailty contributory, last fall 03/03/15, forehead abrasion. Close supervision needed for safety.       Senile degeneration of brain     Noted on CT and MRI of the brain 10/14/09, requires more assistance with ADLs in SNF           Family/ staff Communication: continue SNF for care needs  Labs/tests ordered:  none  [facility] Mast NP [facility] [address], [place] [address] On Call:  [phone] & follow prompts after 5pm & weekends Office Phone:  [phone] Office Fax:  [phone]

## 2015-03-04 NOTE — Assessment & Plan Note (Signed)
Noted on CT and MRI of the brain 10/14/09, requires more assistance with ADLs in SNF  

## 2015-03-04 NOTE — Assessment & Plan Note (Signed)
10/05/14 Neurology: continue ASA 81mg, does not report swallowing difficulty at this time, make sure she is well hydrated. -- no significant change.  

## 2015-03-04 NOTE — Assessment & Plan Note (Signed)
Frequent falling, lack of safety awareness, increased frailty contributory, last fall 03/03/15, forehead abrasion. Close supervision needed for safety.

## 2015-03-23 ENCOUNTER — Ambulatory Visit (INDEPENDENT_AMBULATORY_CARE_PROVIDER_SITE_OTHER): Payer: Medicare Other | Admitting: Nurse Practitioner

## 2015-03-23 ENCOUNTER — Encounter: Payer: Self-pay | Admitting: Nurse Practitioner

## 2015-03-23 VITALS — BP 111/67 | HR 67 | Wt 117.0 lb

## 2015-03-23 DIAGNOSIS — W19XXXD Unspecified fall, subsequent encounter: Secondary | ICD-10-CM

## 2015-03-23 DIAGNOSIS — G231 Progressive supranuclear ophthalmoplegia [Steele-Richardson-Olszewski]: Secondary | ICD-10-CM

## 2015-03-23 DIAGNOSIS — R269 Unspecified abnormalities of gait and mobility: Secondary | ICD-10-CM

## 2015-03-23 DIAGNOSIS — I679 Cerebrovascular disease, unspecified: Secondary | ICD-10-CM

## 2015-03-23 NOTE — Patient Instructions (Signed)
Continue low-dose aspirin Patient does not wish to have further evaluation of swallowing Mini-Mental status score today 22 out of 30, no recall items were missed Due to gait abnormality patient is to transfer only with assistance, risk for falls Follow-up in 6 months next visit with Dr. Jannifer Franklin

## 2015-03-23 NOTE — Progress Notes (Signed)
[facility]  PATIENT: Monica Waller DOB: March 11, 1924   REASON FOR VISIT: Progressive supranuclear palsy, abnormality of gait, history of ischemic optic neuropathy OS  HISTORY FROM: Daughter and patient    HISTORY OF PRESENT ILLNESS:Monica Waller is an 82 year old left-handed white female with a history of progressive supranuclear palsy. Patient was last seen 10/05/14. 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 The patient did not know when the actual event occurred, and the event was painless in onset. The patient has undergone a cerebrovascular workup that revealed an unremarkable MRI of the brain, carotid Doppler studies were unremarkable, and a sedimentation rate was normal. The patient has been placed on low-dose aspirin therapy. She has not regained any of her sight in the left eye, but no visual changes have been noted either. The patient has not been able to read secondary to severe restrictions and eye movements in all fields of gaze. The patient is nonambulatory, and she was having increasing difficulty with speech when last seen however this has not progressed. The patient reports difficulty with swallowing, but she does not wish to have a swallowing evaluation. She lives in a skilled facility. Apparently she is taken  to the dining room 3 times a day for her meals Her power of attorney, Monica Waller is with her today and requests that we check her memory. She feels like her memory is okay however her family that lives in [place] wants [place] to check her memory. Patient has had several falls, she falls forward out of her wheelchair. She now has wheelchair alarms .She returns for an evaluation.    REVIEW OF SYSTEMS: Full 14 system review of systems performed and notable only for those listed, all others are neg:  Constitutional: Activity change  Cardiovascular: neg Ear/Nose/Throat: neg  Skin: neg Eyes: neg Respiratory:  neg Gastroitestinal: neg  Hematology/Lymphatic: neg  Endocrine: neg Musculoskeletal: Gait abnormality Allergy/Immunology: neg Neurological: Speech difficulty Psychiatric: neg Sleep : neg   ALLERGIES: No Known Allergies  HOME MEDICATIONS: Outpatient Prescriptions Prior to Visit  Medication Sig Dispense Refill  . acetaminophen (TYLENOL) 325 MG tablet Take 2 tablets (650 mg total) by mouth every 6 (six) hours as needed.    Marland Kitchen aspirin 81 MG tablet Take 81 mg by mouth daily.    . Calcium Carb-Cholecalciferol (CALTRATE 600+D) 600-800 MG-UNIT TABS Take 1 tablet by mouth.    . multivitamin-iron-minerals-folic acid (CENTRUM) chewable tablet Chew 1 tablet by mouth daily.    Marland Kitchen senna (SENOKOT) 8.6 MG tablet Take 1 tablet by mouth 2 (two) times daily.    . polyvinyl alcohol (ARTIFICIAL TEARS) 1.4 % ophthalmic solution 1 drop as needed for dry eyes. *wait 2-5 minutes between eye drops , twice daily     No facility-administered medications prior to visit.    PAST MEDICAL HISTORY: Past Medical History  Diagnosis Date  . Cerebral degeneration   . Ataxia   . Gait disorder   . Progressive supranuclear palsy ([place])   . Subdural hematoma (HCC)     Hx. of a left frontal  . 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 Other generalized ischemic cerebrovascular disease 10/14/09    Noted on CT and MR brain 10/14/09  . Dysphagia, oropharyngeal phase   . Cancer ([place])     Breast  . Malignant neoplasm of breast (female), unspecified site   . Central retinal artery  occlusion 12/23/2013    Left eye  . Supranuclear palsy     PAST SURGICAL HISTORY: Past Surgical History  Procedure Laterality Date  . Mastectomy Right 1998    Dr. Rebekah Chesterfield  . Tonsillectomy    . Cataract extraction, bilateral      FAMILY HISTORY: Family History  Problem Relation Age of Onset  . Other Mother     "Old Age"  . Cancer Father   . Cancer Brother     Colon    SOCIAL  HISTORY: Social History   Social History  . Marital Status: Widowed    Spouse Name: N/A  . Number of Children: N/A  . Years of Education: N/A   Occupational History  . retired Quarry manager    Social History Main Topics  . Smoking status: Former Smoker    Types: Cigarettes    Quit date: 05/04/1965  . Smokeless tobacco: Never Used  . Alcohol Use: No     Comment: Quit drinking  . Drug Use: No  . Sexual Activity: No   Other Topics Concern  . Not on file   Social History Narrative   L[facility] at [facility]   Widowed   No children; one step daughter Monica Waller   Stopped smoking 1967   Alcohol none   Wheelchair   Exercise none   DNR, MOST, Living Will, POA     PHYSICAL EXAM  Filed Vitals:   03/23/15 1106  BP: 111/67  Pulse: 67  Weight: 117 lb (53.071 kg)   Body mass index is 19.47 kg/(m^2). General: The patient is alert and cooperative at the time of the examination. Skin: No significant peripheral edema is noted. Neurologic Exam Mental status: The patient is oriented x 3. Follows all commands MMSE 22/30 missing items in orientation calculation, unable to write a sentence or copy a figure due to vision. She did not miss recall items. Cranial nerves: Facial symmetry is present. Speech is slightly dysarthric. Extraocular movements are significantly restricted in a vertical plane. The patient has difficulty tracking objects horizontally. Visual fields are full with the right eye., the patient does not have count fingers with the left eye. Masking of the face is noted. Motor: The patient has good strength in all 4 extremities. Sensory examination: Soft touch sensation is symmetric on the face, arms, legs. Pinprick sensation is also symmetric throughout. Coordination: The patient has good finger-nose-finger and heel-to-shin bilaterally.Rapid alternating movements are impaired bilaterally, somewhat worse on the right. Gait and station: The patient is not ambulatory, she is  in a wheelchair, gait was not tested. Reflexes: Deep tendon reflexes are symmetric.   DIAGNOSTIC DATA (LABS, IMAGING, TESTING) - I reviewed patient records, labs, notes, testing and imaging myself where available.  Lab Results  Component Value Date   WBC 6.4 09/08/2014   HGB 12.7 09/08/2014   HCT 38 09/08/2014   MCV 92.8 01/28/2014   PLT 241 09/08/2014      Component Value Date/Time   NA 139 09/08/2014   NA 138 01/27/2014 1647   K 4.2 09/08/2014   CL 103 01/27/2014 1647   CO2 21 01/27/2014 1647   GLUCOSE 93 01/27/2014 1647   BUN 24* 09/08/2014   BUN 26* 01/27/2014 1647   CREATININE 1.0 09/08/2014   CREATININE 0.81 01/27/2014 1647   CALCIUM 9.5 01/27/2014 1647   AST 19 09/08/2014   ALT 13 09/08/2014   ALKPHOS 63 09/08/2014   GFRNONAA 63* 01/27/2014 1647   GFRAA 73* 01/27/2014 1647  Lab Results  Component Value Date   TSH 3.22 09/08/2014      ASSESSMENT AND PLAN 81 y.o. year old female has a past medical history of Cerebral degeneration; Ataxia; Gait disorder; Progressive supranuclear palsy; here to follow-up.  Continue low-dose aspirin Patient does not wish to have further evaluation of swallowing Mini-Mental status score today 22 out of 30, no recall items were missed Due to gait abnormality patient is to transfer only with assistance, risk for falls Follow-up in 6 months next visit with Dr. Jerline Pain, [facility], [facility], APRN  [facility] [address] [place], [place] [address] [phone]

## 2015-03-23 NOTE — Progress Notes (Signed)
I have read the note, and I agree with the clinical assessment and plan.  Monica Waller,Monica Waller   

## 2015-03-28 ENCOUNTER — Encounter: Payer: Self-pay | Admitting: Internal Medicine

## 2015-03-28 ENCOUNTER — Non-Acute Institutional Stay (SKILLED_NURSING_FACILITY): Payer: Medicare Other | Admitting: Internal Medicine

## 2015-03-28 DIAGNOSIS — G231 Progressive supranuclear ophthalmoplegia [Steele-Richardson-Olszewski]: Secondary | ICD-10-CM

## 2015-03-28 DIAGNOSIS — R1312 Dysphagia, oropharyngeal phase: Secondary | ICD-10-CM | POA: Diagnosis not present

## 2015-03-28 NOTE — Progress Notes (Signed)
Patient ID: Monica Waller, female   DOB: Mar 29, 1924, 81 y.o.   MRN: [id number]    Monica Waller Room Number: N 18  Place of Service: SNF (31)     No Known Allergies  No chief complaint on file.   HPI:  Progressive supranuclear palsy ([place]) - last visit with neurologist was 03/23/2015 for routine reevaluation. No new changes. Mini-Mental Status scored 22/30  Dysphagia, oropharyngeal phase - no change. No recent pneumonia, choking, or failure to be able to finish meals related to aspirations. Patient is frail and has lost some weight over the last year.    Medications: Patient's Medications  New Prescriptions   No medications on file  Previous Medications   ACETAMINOPHEN (TYLENOL) 325 MG TABLET    Take 2 tablets (650 mg total) by mouth every 6 (six) hours as needed.   ASPIRIN 81 MG TABLET    Take 81 mg by mouth daily.   CALCIUM CARB-CHOLECALCIFEROL (CALTRATE 600+D) 600-800 MG-UNIT TABS    Take 1 tablet by mouth.   MULTIVITAMIN-IRON-MINERALS-FOLIC ACID (CENTRUM) CHEWABLE TABLET    Chew 1 tablet by mouth daily.   SENNA (SENOKOT) 8.6 MG TABLET    Take 1 tablet by mouth 2 (two) times daily.  Modified Medications   No medications on file  Discontinued Medications   No medications on file     Review of Systems  Constitutional:       10. Frail.  HENT: Positive for hearing loss. Negative for ear pain.   Eyes: Negative.   Respiratory: Negative.   Cardiovascular: Negative for chest pain, palpitations and leg swelling.  Gastrointestinal:       Chronic dysphagia related to her supranuclear palsy.  Endocrine: Negative.  Negative for polydipsia.  Genitourinary: Negative.   Musculoskeletal:       Unstable gait. History of multiple falls.  Skin:       History right mastectomy for breast cancer.  Neurological:       Progressive supranuclear palsy. Ataxia. Unstable gait. Clumsy right leg and foot. Loss of memory.  Hematological: Negative.     Psychiatric/Behavioral: Negative.     Filed Vitals:   03/28/15 1535  BP: 112/62  Pulse: 81  Temp: 96.2 F (35.7 C)  Resp: 20  Height: _0  (1.651 m)  Weight: 117 lb 9.6 oz (53.343 kg)  SpO2: 98%   Body mass index is 19.57 kg/(m^2).  Physical Exam  Constitutional: She is oriented to person, place, and time. She appears well-developed and well-nourished. No distress.  HENT:  Head: Normocephalic.  Right Ear: External ear normal.  Left Ear: External ear normal.  Nose: Nose normal.  Mouth/Throat: Oropharynx is clear and moist. No oropharyngeal exudate.  Eyes: Conjunctivae are normal. Right eye exhibits no discharge. Left eye exhibits no discharge. No scleral icterus.  Difficulty looking down followed by an upgaze ?palsy  Neck: Normal range of motion. Neck supple. No JVD present. No tracheal deviation present. No thyromegaly present.  Cardiovascular: Normal rate, regular rhythm, normal heart sounds and intact distal pulses.   No murmur heard. Pulmonary/Chest: Effort normal and breath sounds normal. No stridor. No respiratory distress. She has no wheezes. She has no rales. She exhibits no tenderness.  Abdominal: Soft. Bowel sounds are normal. She exhibits no distension. There is no tenderness. There is no rebound and no guarding.  Musculoskeletal: Normal range of motion. She exhibits no edema or tenderness.  Lymphadenopathy:    She has no cervical adenopathy.  Neurological: She  is alert and oriented to person, place, and time. She displays normal reflexes. No cranial nerve deficit. She exhibits normal muscle tone. Coordination abnormal.  02/20/15 MMSE 22/30.  Skin: Skin is warm and dry. No rash noted. She is not diaphoretic. No erythema. No pallor.  Psychiatric: She has a normal mood and affect. Her speech is normal and behavior is normal. Judgment and thought content normal. Cognition and memory are impaired. She does not express impulsivity or inappropriate judgment. She exhibits  abnormal recent memory. She exhibits normal remote memory.     Labs reviewed: Lab Summary Latest Ref Rng 09/08/2014 01/28/2014 01/27/2014  Hemoglobin 12.0 - 16.0 g/dL 12.7 13.3 13.4  Hematocrit 36 - 46 % 38 38.8 40.3  White count - 6.4 6.6 8.1  Platelet count 150 - 399 K/L 241 241 273  Sodium 137 - 147 mmol/L 139 (None) 138  Potassium 3.4 - 5.3 mmol/L 4.2 (None) 4.0  Calcium 8.4 - 10.5 mg/dL (None) (None) 9.5  Phosphorus - (None) (None) (None)  Creatinine 0.5 - 1.1 mg/dL 1.0 (None) 0.81  AST 13 - 35 U/L 19 (None) (None)  Alk Phos 25 - 125 U/L 63 (None) (None)  Bilirubin - (None) (None) (None)  Glucose - 85 (None) 93  Cholesterol - (None) (None) (None)  HDL cholesterol - (None) (None) (None)  Triglycerides - (None) (None) (None)  LDL Direct - (None) (None) (None)  LDL Calc - (None) (None) (None)  Total protein - (None) (None) (None)  Albumin - (None) (None) (None)   Lab Results  Component Value Date   TSH 3.22 09/08/2014   Lab Results  Component Value Date   BUN 24* 09/08/2014   No results found for: HGBA1C     Assessment/Plan  1. Progressive supranuclear palsy (HCC) No change in medication or treatment. Patient has a completely dependent individual. She needs help for all activities of daily living.  2. Dysphagia, oropharyngeal phase Continue to monitor. May be associated with her weight loss. Weight is down 6 ounces January 2016.

## 2015-03-30 ENCOUNTER — Ambulatory Visit: Payer: Medicare Other | Admitting: Nurse Practitioner

## 2015-04-01 ENCOUNTER — Encounter: Payer: Self-pay | Admitting: Nurse Practitioner

## 2015-04-01 ENCOUNTER — Non-Acute Institutional Stay (SKILLED_NURSING_FACILITY): Payer: Medicare Other | Admitting: Nurse Practitioner

## 2015-04-01 DIAGNOSIS — G231 Progressive supranuclear ophthalmoplegia [Steele-Richardson-Olszewski]: Secondary | ICD-10-CM | POA: Diagnosis not present

## 2015-04-01 DIAGNOSIS — R1312 Dysphagia, oropharyngeal phase: Secondary | ICD-10-CM

## 2015-04-01 DIAGNOSIS — I679 Cerebrovascular disease, unspecified: Secondary | ICD-10-CM | POA: Diagnosis not present

## 2015-04-01 NOTE — Assessment & Plan Note (Signed)
Noted on CT and MRI of the brain 10/14/09, requires more assistance with ADLs in SNF  

## 2015-04-01 NOTE — Progress Notes (Signed)
Patient ID: Monica Waller, female   DOB: 1923-08-11, 80 y.o.   MRN: [id number]  Location:  SNF [facility] Provider:  Marlana Latus NP  Code Status:  DNR Goals of care: Advanced Directive information    Chief Complaint  Patient presents with  . Medical Management of Chronic Issues     HPI: Patient is a 81 y.o. female seen in the SNF at [facility] today for evaluation of Hx of long standing progressive supranuclear palsy along with degenerative brain, poor balance, multiple falls, SNF for ADLs, f/u Neurology regularly, she is able to swallow modified diet wo difficulty.   Review of Systems  Constitutional: Negative for weight loss and malaise/fatigue.  HENT: Positive for hearing loss. Negative for congestion and ear pain.   Eyes: Negative for pain and discharge.  Respiratory: Negative.  Negative for cough and sputum production.   Cardiovascular: Negative for chest pain, palpitations and leg swelling.  Gastrointestinal: Negative.  Negative for abdominal pain and constipation.  Genitourinary: Negative.  Negative for dysuria.  Musculoskeletal: Negative for back pain, joint pain and neck pain.       Unstable gait. History of multiple falls.  Skin:       History right mastectomy for breast cancer    Neurological: Negative for dizziness, tremors, speech change, loss of consciousness and weakness.       Progressive supranuclear palsy. Ataxia. Unstable gait. Clumsy right leg and foot.  Endo/Heme/Allergies: Negative for polydipsia.  Psychiatric/Behavioral: Positive for memory loss. Negative for depression. The patient is not nervous/anxious and does not have insomnia.     Past Medical History  Diagnosis Date  . Cerebral degeneration   . Ataxia   . Gait disorder   . Progressive supranuclear palsy ([place])   . Subdural hematoma (HCC)     Hx. of a left frontal  . 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 Other generalized ischemic cerebrovascular  disease 10/14/09    Noted on CT and MR brain 10/14/09  . Dysphagia, oropharyngeal phase   . Cancer ([place])     Breast  . Malignant neoplasm of breast (female), unspecified site   . Central retinal artery occlusion 12/23/2013    Left eye  . Supranuclear palsy     Patient Active Problem List   Diagnosis Date Noted  . C1 cervical fracture ([place]) 02/04/2014  . DNR (do not resuscitate) 02/02/2014  . Central retinal artery occlusion, left eye 12/25/2013  . Dysphagia, oropharyngeal phase   . Malignant neoplasm of breast (female), unspecified site   . Gait disorder   . UTI (lower urinary tract infection) 01/18/2013  . Fall 01/18/2013  . Anemia 06/04/2012  . Abnormality of gait 06/04/2012  . Progressive supranuclear palsy ([place]) 06/04/2012  . Cerebrovascular disease 10/14/2009  . 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   ASPIRIN 81 MG TABLET    Take 81 mg by mouth daily.   CALCIUM CARB-CHOLECALCIFEROL (CALTRATE 600+D) 600-800 MG-UNIT TABS    Take 1 tablet by mouth.   MULTIVITAMIN-IRON-MINERALS-FOLIC ACID (CENTRUM) CHEWABLE TABLET    Chew 1 tablet by mouth daily.   SENNA (SENOKOT) 8.6 MG TABLET    Take 1 tablet by  mouth 2 (two) times daily.  Modified Medications   No medications on file  Discontinued Medications   No medications on file    Physical Exam: Filed Vitals:   04/01/15 1056  BP: 124/72  Pulse: 78  Temp: 97.2 F (36.2 C)  TempSrc: Tympanic  Resp: 18   There is no weight on file to calculate BMI.  Physical Exam  Constitutional: She is oriented to person, place, and time. She appears well-developed and well-nourished. No distress.  HENT:  Head: Normocephalic.  Right Ear: External ear normal.  Left Ear: External ear normal.  Nose:  Nose normal.  Mouth/Throat: Oropharynx is clear and moist. No oropharyngeal exudate.  Eyes: Conjunctivae are normal. Right eye exhibits no discharge. Left eye exhibits no discharge. No scleral icterus.  Difficulty looking down followed by an upgaze ?palsy  Neck: Normal range of motion. Neck supple. No JVD present. No tracheal deviation present. No thyromegaly present.  Cardiovascular: Normal rate, regular rhythm, normal heart sounds and intact distal pulses.   No murmur heard. Pulmonary/Chest: Effort normal and breath sounds normal. She has no wheezes. She has no rales. She exhibits no tenderness.  Abdominal: Soft. Bowel sounds are normal. She exhibits no distension. There is no tenderness.  Musculoskeletal: Normal range of motion. She exhibits no edema or tenderness.  Stiffness limbs  Neurological: She is alert and oriented to person, place, and time. She displays normal reflexes. No cranial nerve deficit. She exhibits normal muscle tone. Coordination abnormal.  Skin: Skin is warm and dry. No rash noted. She is not diaphoretic. No erythema. No pallor.  Psychiatric: She has a normal mood and affect. Her speech is normal and behavior is normal. Judgment and thought content normal. Cognition and memory are impaired. She does not express impulsivity or inappropriate judgment. She exhibits abnormal recent memory. She exhibits normal remote memory.    Labs reviewed: Basic Metabolic Panel:  Recent Labs  09/08/14  NA 139  K 4.2  BUN 24*  CREATININE 1.0    Liver Function Tests:  Recent Labs  09/08/14  AST 19  ALT 13  ALKPHOS 63    CBC:  Recent Labs  09/08/14  WBC 6.4  HGB 12.7  HCT 38  PLT 241    Lab Results  Component Value Date   TSH 3.22 09/08/2014   No results found for: HGBA1C Lab Results  Component Value Date   CHOL * 10/14/2009    213        ATP III CLASSIFICATION:  <200     mg/dL   Desirable  200-239  mg/dL   Borderline High  >=240    mg/dL   High           HDL 64 10/14/2009   LDLCALC * 10/14/2009    132        Total Cholesterol/HDL:CHD Risk Coronary Heart Disease Risk Table                     Men   Women  1/2 Average Risk   3.4   3.3  Average Risk       5.0   4.4  2 X Average Risk   9.6   7.1  3 X Average Risk  23.4   11.0        Use the calculated Patient Ratio above and the CHD Risk Table to determine the patient's CHD Risk.        ATP III CLASSIFICATION (LDL):  <100  mg/dL   Optimal  100-129  mg/dL   Near or Above                    Optimal  130-159  mg/dL   Borderline  160-189  mg/dL   High  >190     mg/dL   Very High   TRIG 87 10/14/2009   CHOLHDL 3.3 10/14/2009    Significant Diagnostic Results since last visit: none  Patient Care Team: Estill Dooms, MD as PCP - General (Internal Medicine) Morse Brueggemann X, NP as Nurse Practitioner (Nurse Practitioner) [place] Kathrynn Ducking, MD as Consulting Physician (Neurology) Luberta Mutter, MD as Consulting Physician (Ophthalmology) Kristeen Miss, MD as Consulting Physician (Neurosurgery)  Assessment/Plan Problem List Items Addressed This Visit    Cerebrovascular disease - Primary    Noted on CT and MRI of the brain 10/14/09, requires more assistance with ADLs in SNF       Dysphagia, oropharyngeal phase (Chronic)    Modify diet. Risk for aspiration.      Progressive supranuclear palsy ([place]) (Chronic)    10/05/14 Neurology: continue ASA 81mg , does not report swallowing difficulty at this time, make sure she is well hydrated. -- no significant change.           Family/ staff Communication: continue SNF for care needs  Labs/tests ordered:  none  [facility] Monica Vangieson NP [facility] [address], [place] [address] On Call:  [phone] & follow prompts after 5pm & weekends Office Phone:  [phone] Office Fax:  [phone]

## 2015-04-01 NOTE — Assessment & Plan Note (Signed)
10/05/14 Neurology: continue ASA 81mg, does not report swallowing difficulty at this time, make sure she is well hydrated. -- no significant change.  

## 2015-04-01 NOTE — Assessment & Plan Note (Signed)
Modify diet. Risk for aspiration.    

## 2015-04-08 ENCOUNTER — Ambulatory Visit: Payer: Medicare Other | Admitting: Nurse Practitioner

## 2015-05-03 ENCOUNTER — Encounter: Payer: Self-pay | Admitting: Nurse Practitioner

## 2015-05-03 ENCOUNTER — Non-Acute Institutional Stay (SKILLED_NURSING_FACILITY): Payer: Medicare Other | Admitting: Nurse Practitioner

## 2015-05-03 DIAGNOSIS — R1312 Dysphagia, oropharyngeal phase: Secondary | ICD-10-CM

## 2015-05-03 DIAGNOSIS — G231 Progressive supranuclear ophthalmoplegia [Steele-Richardson-Olszewski]: Secondary | ICD-10-CM | POA: Diagnosis not present

## 2015-05-03 DIAGNOSIS — I679 Cerebrovascular disease, unspecified: Secondary | ICD-10-CM

## 2015-05-03 NOTE — Progress Notes (Signed)
Patient ID: Monica Waller, female   DOB: 02/01/1924, 82 y.o.   MRN: [id number]  Location:  SNF [facility] Provider:  Marlana Latus NP  Code Status:  DNR Goals of care: Advanced Directive information    Chief Complaint  Patient presents with  . Medical Management of Chronic Issues     HPI: Patient is a 82 y.o. female seen in the SNF at [facility] today for evaluation of Hx of long standing progressive supranuclear palsy along with degenerative brain, poor balance, multiple falls, SNF for ADLs, f/u Neurology regularly, she is able to swallow modified diet wo difficulty.   Review of Systems  Constitutional: Negative for weight loss and malaise/fatigue.  HENT: Positive for hearing loss. Negative for congestion and ear pain.   Eyes: Negative for pain and discharge.  Respiratory: Negative.  Negative for cough and sputum production.   Cardiovascular: Negative for chest pain, palpitations and leg swelling.  Gastrointestinal: Negative.  Negative for abdominal pain and constipation.  Genitourinary: Negative.  Negative for dysuria.  Musculoskeletal: Negative for back pain, joint pain and neck pain.       Unstable gait. History of multiple falls.  Skin:       History right mastectomy for breast cancer    Neurological: Negative for dizziness, tremors, speech change, loss of consciousness and weakness.       Progressive supranuclear palsy. Ataxia. Unstable gait. Clumsy right leg and foot.  Endo/Heme/Allergies: Negative for polydipsia.  Psychiatric/Behavioral: Positive for memory loss. Negative for depression. The patient is not nervous/anxious and does not have insomnia.     Past Medical History  Diagnosis Date  . Cerebral degeneration   . Ataxia   . Gait disorder   . Progressive supranuclear palsy ([place])   . Subdural hematoma (HCC)     Hx. of a left frontal  . 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 Other generalized ischemic cerebrovascular  disease 10/14/09    Noted on CT and MR brain 10/14/09  . Dysphagia, oropharyngeal phase   . Cancer ([place])     Breast  . Malignant neoplasm of breast (female), unspecified site   . Central retinal artery occlusion 12/23/2013    Left eye  . Supranuclear palsy     Patient Active Problem List   Diagnosis Date Noted  . C1 cervical fracture ([place]) 02/04/2014  . DNR (do not resuscitate) 02/02/2014  . Central retinal artery occlusion, left eye 12/25/2013  . Dysphagia, oropharyngeal phase   . Malignant neoplasm of breast (female), unspecified site   . Gait disorder   . UTI (lower urinary tract infection) 01/18/2013  . Fall 01/18/2013  . Anemia 06/04/2012  . Abnormality of gait 06/04/2012  . Progressive supranuclear palsy ([place]) 06/04/2012  . Cerebrovascular disease 10/14/2009  . 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   ASPIRIN 81 MG TABLET    Take 81 mg by mouth daily.   CALCIUM CARB-CHOLECALCIFEROL (CALTRATE 600+D) 600-800 MG-UNIT TABS    Take 1 tablet by mouth.   MULTIVITAMIN-IRON-MINERALS-FOLIC ACID (CENTRUM) CHEWABLE TABLET    Chew 1 tablet by mouth daily.   SENNA (SENOKOT) 8.6 MG TABLET    Take 1 tablet by  mouth 2 (two) times daily.  Modified Medications   No medications on file  Discontinued Medications   No medications on file    Physical Exam: Filed Vitals:   05/03/15 1651  BP: 122/64  Pulse: 76  Temp: 97.7 F (36.5 C)  TempSrc: Tympanic  Resp: 18   There is no weight on file to calculate BMI.  Physical Exam  Constitutional: She is oriented to person, place, and time. She appears well-developed and well-nourished. No distress.  HENT:  Head: Normocephalic.  Right Ear: External ear normal.  Left Ear: External ear normal.  Nose:  Nose normal.  Mouth/Throat: Oropharynx is clear and moist. No oropharyngeal exudate.  Eyes: Conjunctivae are normal. Right eye exhibits no discharge. Left eye exhibits no discharge. No scleral icterus.  Difficulty looking down followed by an upgaze ?palsy  Neck: Normal range of motion. Neck supple. No JVD present. No tracheal deviation present. No thyromegaly present.  Cardiovascular: Normal rate, regular rhythm, normal heart sounds and intact distal pulses.   No murmur heard. Pulmonary/Chest: Effort normal and breath sounds normal. She has no wheezes. She has no rales. She exhibits no tenderness.  Abdominal: Soft. Bowel sounds are normal. She exhibits no distension. There is no tenderness.  Musculoskeletal: Normal range of motion. She exhibits no edema or tenderness.  Stiffness limbs  Neurological: She is alert and oriented to person, place, and time. She displays normal reflexes. No cranial nerve deficit. She exhibits normal muscle tone. Coordination abnormal.  Skin: Skin is warm and dry. No rash noted. She is not diaphoretic. No erythema. No pallor.  Psychiatric: She has a normal mood and affect. Her speech is normal and behavior is normal. Judgment and thought content normal. Cognition and memory are impaired. She does not express impulsivity or inappropriate judgment. She exhibits abnormal recent memory. She exhibits normal remote memory.    Labs reviewed: Basic Metabolic Panel:  Recent Labs  09/08/14  NA 139  K 4.2  BUN 24*  CREATININE 1.0    Liver Function Tests:  Recent Labs  09/08/14  AST 19  ALT 13  ALKPHOS 63    CBC:  Recent Labs  09/08/14  WBC 6.4  HGB 12.7  HCT 38  PLT 241    Lab Results  Component Value Date   TSH 3.22 09/08/2014   No results found for: HGBA1C Lab Results  Component Value Date   CHOL * 10/14/2009    213        ATP III CLASSIFICATION:  <200     mg/dL   Desirable  200-239  mg/dL   Borderline High  >=240    mg/dL   High           HDL 64 10/14/2009   LDLCALC * 10/14/2009    132        Total Cholesterol/HDL:CHD Risk Coronary Heart Disease Risk Table                     Men   Women  1/2 Average Risk   3.4   3.3  Average Risk       5.0   4.4  2 X Average Risk   9.6   7.1  3 X Average Risk  23.4   11.0        Use the calculated Patient Ratio above and the CHD Risk Table to determine the patient's CHD Risk.        ATP III CLASSIFICATION (LDL):  <100  mg/dL   Optimal  100-129  mg/dL   Near or Above                    Optimal  130-159  mg/dL   Borderline  160-189  mg/dL   High  >190     mg/dL   Very High   TRIG 87 10/14/2009   CHOLHDL 3.3 10/14/2009    Significant Diagnostic Results since last visit: none  Patient Care Team: Estill Dooms, MD as PCP - General (Internal Medicine) Joseff Luckman X, NP as Nurse Practitioner (Nurse Practitioner) [facility] Kathrynn Ducking, MD as Consulting Physician (Neurology) Luberta Mutter, MD as Consulting Physician (Ophthalmology) Kristeen Miss, MD as Consulting Physician (Neurosurgery)  Assessment/Plan Problem List Items Addressed This Visit    Progressive supranuclear palsy [facility]) - Primary (Chronic)    10/05/14 Neurology: continue ASA 81mg , does not report swallowing difficulty at this time, make sure she is well hydrated. -- no significant change.       Dysphagia, oropharyngeal phase (Chronic)    Modify diet. Risk for aspiration.      Cerebrovascular disease    Noted on CT and MRI of the brain 10/14/09, requires more assistance with ADLs in SNF           Family/ staff Communication: continue SNF for care needs  Labs/tests ordered:  none  [facility] Rebekah Sprinkle NP Geriatrics [facility] [address], [place] [address] On Call:  [phone] & follow prompts after 5pm & weekends Office Phone:  [phone] Office Fax:  [phone]

## 2015-05-03 NOTE — Assessment & Plan Note (Signed)
Modify diet. Risk for aspiration.    

## 2015-05-03 NOTE — Assessment & Plan Note (Signed)
Noted on CT and MRI of the brain 10/14/09, requires more assistance with ADLs in SNF  

## 2015-05-03 NOTE — Assessment & Plan Note (Signed)
10/05/14 Neurology: continue ASA 81mg, does not report swallowing difficulty at this time, make sure she is well hydrated. -- no significant change.  

## 2015-05-06 ENCOUNTER — Encounter: Payer: Self-pay | Admitting: Nurse Practitioner

## 2015-05-06 ENCOUNTER — Non-Acute Institutional Stay (SKILLED_NURSING_FACILITY): Payer: Medicare Other | Admitting: Nurse Practitioner

## 2015-05-06 DIAGNOSIS — R1312 Dysphagia, oropharyngeal phase: Secondary | ICD-10-CM | POA: Diagnosis not present

## 2015-05-06 DIAGNOSIS — G231 Progressive supranuclear ophthalmoplegia [Steele-Richardson-Olszewski]: Secondary | ICD-10-CM | POA: Diagnosis not present

## 2015-05-06 DIAGNOSIS — L02222 Furuncle of back [any part, except buttock]: Secondary | ICD-10-CM | POA: Insufficient documentation

## 2015-05-06 DIAGNOSIS — I679 Cerebrovascular disease, unspecified: Secondary | ICD-10-CM | POA: Diagnosis not present

## 2015-05-06 NOTE — Assessment & Plan Note (Signed)
Lower back, a golf ball sized, I+D, will apply Bactroban oint daily. Doxycycline 100mg  bid x 10 day.

## 2015-05-06 NOTE — Assessment & Plan Note (Signed)
Noted on CT and MRI of the brain 10/14/09, requires more assistance with ADLs in SNF  

## 2015-05-06 NOTE — Assessment & Plan Note (Signed)
Modify diet. Risk for aspiration.    

## 2015-05-06 NOTE — Progress Notes (Signed)
Patient ID: Monica Waller, female   DOB: [birth year], 82 y.o.   MRN: [id number]  Location:  SNF [facility] Provider:  Marlana Latus NP  Code Status:  DNR Goals of care: Advanced Directive information    Chief Complaint  Patient presents with  . Medical Management of Chronic Issues  . Acute Visit    a large furuncle lower back.     HPI: Patient is a 82 y.o. female seen in the SNF at [facility] today for evaluation of lower back a large furuncle. 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   Review of Systems  Constitutional: Negative for weight loss and malaise/fatigue.  HENT: Positive for hearing loss. Negative for congestion and ear pain.   Eyes: Negative for pain and discharge.  Respiratory: Negative.  Negative for cough and sputum production.   Cardiovascular: Negative for chest pain, palpitations and leg swelling.  Gastrointestinal: Negative.  Negative for abdominal pain and constipation.  Genitourinary: Negative.  Negative for dysuria.  Musculoskeletal: Negative for back pain, joint pain and neck pain.       Unstable gait. History of multiple falls.  Skin:       History right mastectomy for breast cancer A large furuncle lower back    Neurological: Negative for dizziness, tremors, speech change, loss of consciousness and weakness.       Progressive supranuclear palsy. Ataxia. Unstable gait. Clumsy right leg and foot.  Endo/Heme/Allergies: Negative for polydipsia.  Psychiatric/Behavioral: Positive for memory loss. Negative for depression. The patient is not nervous/anxious and does not have insomnia.     Past Medical History  Diagnosis Date  . Cerebral degeneration   . Ataxia   . Gait disorder   . Progressive supranuclear palsy ([place])   . Subdural hematoma (HCC)     Hx. of a left frontal  . Senile degeneration of  brain 10/14/09    Noted on CT and MRI of the brain 10/14/09  . Other generalized ischemic cerebrovascular disease 10/14/09    Noted on CT and MR brain 10/14/09  . Dysphagia, oropharyngeal phase   . Cancer ([place])     Breast  . Malignant neoplasm of breast (female), unspecified site   . Central retinal artery occlusion 12/23/2013    Left eye  . Supranuclear palsy     Patient Active Problem List   Diagnosis Date Noted  . Furuncle of back, except buttock 05/06/2015  . C1 cervical fracture ([place]) 02/04/2014  . DNR (do not resuscitate) 02/02/2014  . Central retinal artery occlusion, left eye 12/25/2013  . Dysphagia, oropharyngeal phase   . Malignant neoplasm of breast (female), unspecified site   . Gait disorder   . UTI (lower urinary tract infection) 01/18/2013  . Fall 01/18/2013  . Anemia 06/04/2012  . Abnormality of gait 06/04/2012  . Progressive supranuclear palsy ([place]) 06/04/2012  . Cerebrovascular disease 10/14/2009  . 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   ASPIRIN 81 MG TABLET    Take 81 mg by mouth daily.   CALCIUM CARB-CHOLECALCIFEROL (CALTRATE 600+D) 600-800 MG-UNIT TABS    Take 1 tablet by mouth.  MULTIVITAMIN-IRON-MINERALS-FOLIC ACID (CENTRUM) CHEWABLE TABLET    Chew 1 tablet by mouth daily.   SENNA (SENOKOT) 8.6 MG TABLET    Take 1 tablet by mouth 2 (two) times daily.  Modified Medications   No medications on file  Discontinued Medications   No medications on file    Physical Exam: Filed Vitals:   05/06/15 1330  BP: 102/58  Pulse: 56  Temp: 97.5 F (36.4 C)  TempSrc: Tympanic  Resp: 18   There is no weight on file to calculate BMI.  Physical Exam  Constitutional: She is oriented to person, place, and time. She appears  well-developed and well-nourished. No distress.  HENT:  Head: Normocephalic.  Right Ear: External ear normal.  Left Ear: External ear normal.  Nose: Nose normal.  Mouth/Throat: Oropharynx is clear and moist. No oropharyngeal exudate.  Eyes: Conjunctivae are normal. Right eye exhibits no discharge. Left eye exhibits no discharge. No scleral icterus.  Difficulty looking down followed by an upgaze ?palsy  Neck: Normal range of motion. Neck supple. No JVD present. No tracheal deviation present. No thyromegaly present.  Cardiovascular: Normal rate, regular rhythm, normal heart sounds and intact distal pulses.   No murmur heard. Pulmonary/Chest: Effort normal and breath sounds normal. She has no wheezes. She has no rales. She exhibits no tenderness.  Abdominal: Soft. Bowel sounds are normal. She exhibits no distension. There is no tenderness.  Musculoskeletal: Normal range of motion. She exhibits no edema or tenderness.  Stiffness limbs  Neurological: She is alert and oriented to person, place, and time. She displays normal reflexes. No cranial nerve deficit. She exhibits normal muscle tone. Coordination abnormal.  Skin: Skin is warm and dry. No rash noted. She is not diaphoretic. No erythema. No pallor.  A large furuncle lower back  Psychiatric: She has a normal mood and affect. Her speech is normal and behavior is normal. Judgment and thought content normal. Cognition and memory are impaired. She does not express impulsivity or inappropriate judgment. She exhibits abnormal recent memory. She exhibits normal remote memory.    Labs reviewed: Basic Metabolic Panel:  Recent Labs  09/08/14  NA 139  K 4.2  BUN 24*  CREATININE 1.0    Liver Function Tests:  Recent Labs  09/08/14  AST 19  ALT 13  ALKPHOS 63    CBC:  Recent Labs  09/08/14  WBC 6.4  HGB 12.7  HCT 38  PLT 241    Lab Results  Component Value Date   TSH 3.22 09/08/2014   No results found for: HGBA1C Lab Results   Component Value Date   CHOL * 10/14/2009    213        ATP III CLASSIFICATION:  <200     mg/dL   Desirable  200-239  mg/dL   Borderline High  >=240    mg/dL   High          HDL 64 10/14/2009   LDLCALC * 10/14/2009    132        Total Cholesterol/HDL:CHD Risk Coronary Heart Disease Risk Table                     Men   Women  1/2 Average Risk   3.4   3.3  Average Risk       5.0   4.4  2 X Average Risk   9.6   7.1  3 X Average Risk  23.4   11.0  Use the calculated Patient Ratio above and the CHD Risk Table to determine the patient's CHD Risk.        ATP III CLASSIFICATION (LDL):  <100     mg/dL   Optimal  100-129  mg/dL   Near or Above                    Optimal  130-159  mg/dL   Borderline  160-189  mg/dL   High  >190     mg/dL   Very High   TRIG 87 10/14/2009   CHOLHDL 3.3 10/14/2009    Significant Diagnostic Results since last visit: none  Patient Care Team: Estill Dooms, MD as PCP - General (Internal Medicine) Marckus Hanover X, NP as Nurse Practitioner (Nurse Practitioner) [facility] Kathrynn Ducking, MD as Consulting Physician (Neurology) Luberta Mutter, MD as Consulting Physician (Ophthalmology) Kristeen Miss, MD as Consulting Physician (Neurosurgery)  Assessment/Plan Problem List Items Addressed This Visit    Progressive supranuclear palsy [facility]) (Chronic)    10/05/14 Neurology: continue ASA 81mg , does not report swallowing difficulty at this time, make sure she is well hydrated. -- no significant change.       Furuncle of back, except buttock - Primary    Lower back, a golf ball sized, I+D, will apply Bactroban oint daily. Doxycycline 100mg  bid x 10 day.        Dysphagia, oropharyngeal phase (Chronic)    Modify diet. Risk for aspiration.      Cerebrovascular disease    Noted on CT and MRI of the brain 10/14/09, requires more assistance with ADLs in SNF           Family/ staff Communication: continue SNF for care needs  Labs/tests  ordered:  none  [facility] Maxxon Schwanke NP [facility] [address], [place] [address] On Call:  [phone] & follow prompts after 5pm & weekends Office Phone:  [phone] Office Fax:  [phone]

## 2015-05-06 NOTE — Assessment & Plan Note (Signed)
10/05/14 Neurology: continue ASA 81mg, does not report swallowing difficulty at this time, make sure she is well hydrated. -- no significant change.  

## 2015-06-07 ENCOUNTER — Encounter: Payer: Self-pay | Admitting: Nurse Practitioner

## 2015-06-07 ENCOUNTER — Non-Acute Institutional Stay (SKILLED_NURSING_FACILITY): Payer: Medicare Other | Admitting: Nurse Practitioner

## 2015-06-07 DIAGNOSIS — R1312 Dysphagia, oropharyngeal phase: Secondary | ICD-10-CM | POA: Diagnosis not present

## 2015-06-07 DIAGNOSIS — I679 Cerebrovascular disease, unspecified: Secondary | ICD-10-CM | POA: Diagnosis not present

## 2015-06-07 DIAGNOSIS — L02222 Furuncle of back [any part, except buttock]: Secondary | ICD-10-CM

## 2015-06-07 DIAGNOSIS — G231 Progressive supranuclear ophthalmoplegia [Steele-Richardson-Olszewski]: Secondary | ICD-10-CM | POA: Diagnosis not present

## 2015-06-07 NOTE — Assessment & Plan Note (Signed)
Healed

## 2015-06-07 NOTE — Assessment & Plan Note (Signed)
10/05/14 Neurology: continue ASA 81mg, does not report swallowing difficulty at this time, make sure she is well hydrated. -- no significant change.  

## 2015-06-07 NOTE — Assessment & Plan Note (Signed)
Noted on CT and MRI of the brain 10/14/09, requires more assistance with ADLs in SNF, no focal weakness.

## 2015-06-07 NOTE — Progress Notes (Signed)
Patient ID: Monica Waller, female   DOB: Aug 27, 1923, 82 y.o.   MRN: [id number]  Location:  SNF [facility] Provider:  Marlana Latus NP  Code Status:  DNR Goals of care: Advanced Directive information Does patient have an advance directive?: Yes, Type of Advance Directive: [place];Out of facility DNR (pink MOST or yellow form)  Chief Complaint  Patient presents with  . Medical Management of Chronic Issues    Routine Visit     HPI: Patient is a 82 y.o. female seen in the SNF at [facility] today for evaluation of progressive supra nuclear palsy, dysphagia, cerebrovascular disease.   Review of Systems:  Review of Systems  Constitutional: Negative for weight loss and malaise/fatigue.  HENT: Positive for hearing loss. Negative for congestion and ear pain.   Eyes: Negative for pain and discharge.  Respiratory: Negative.  Negative for cough and sputum production.   Cardiovascular: Negative for chest pain, palpitations and leg swelling.  Gastrointestinal: Negative.  Negative for abdominal pain and constipation.  Genitourinary: Negative.  Negative for dysuria.  Musculoskeletal: Negative for back pain, joint pain and neck pain.       Unstable gait. History of multiple falls.  Skin:       History right mastectomy for breast cancer A large furuncle lower back    Neurological: Negative for dizziness, tremors, speech change, loss of consciousness and weakness.       Progressive supranuclear palsy. Ataxia. Unstable gait. Clumsy right leg and foot.  Endo/Heme/Allergies: Negative for polydipsia.  Psychiatric/Behavioral: Positive for memory loss. Negative for depression. The patient is not nervous/anxious and does not have insomnia.     Past Medical History  Diagnosis Date  . Cerebral degeneration   . Ataxia   . Gait disorder   . Progressive supranuclear palsy ([place])   . Subdural hematoma (HCC)     Hx. of a left frontal  . Senile degeneration of brain 10/14/09    Noted on  CT and MRI of the brain 10/14/09  . Other generalized ischemic cerebrovascular disease 10/14/09    Noted on CT and MR brain 10/14/09  . Dysphagia, oropharyngeal phase   . Cancer ([place])     Breast  . Malignant neoplasm of breast (female), unspecified site   . Central retinal artery occlusion 12/23/2013    Left eye  . Supranuclear palsy     Patient Active Problem List   Diagnosis Date Noted  . Furuncle of back, except buttock 05/06/2015  . C1 cervical fracture ([place]) 02/04/2014  . DNR (do not resuscitate) 02/02/2014  . Central retinal artery occlusion, left eye 12/25/2013  . Dysphagia, oropharyngeal phase   . Malignant neoplasm of breast (female), unspecified site   . Gait disorder   . UTI (lower urinary tract infection) 01/18/2013  . Fall 01/18/2013  . Anemia 06/04/2012  . Abnormality of gait 06/04/2012  . Progressive supranuclear palsy ([place]) 06/04/2012  . Cerebrovascular disease 10/14/2009  . 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   ASPIRIN 81 MG TABLET    Take 81 mg by mouth daily.   MULTIVITAMIN-IRON-MINERALS-FOLIC ACID (CENTRUM) CHEWABLE TABLET    Chew 1 tablet by mouth daily.   SENNA (SENOKOT) 8.6 MG TABLET    Take  1 tablet by mouth 2 (two) times daily.  Modified Medications   No medications on file  Discontinued Medications   CALCIUM CARB-CHOLECALCIFEROL (CALTRATE 600+D) 600-800 MG-UNIT TABS    Take 1 tablet by mouth. Reported on 06/07/2015    Physical Exam: Filed Vitals:   06/07/15 0932  BP: 104/60  Pulse: 60  Temp: 98 F (36.7 C)  TempSrc: Oral  Resp: 20  Height: 5\' 5"  (1.651 m)  Weight: 114 lb 4.8 oz (51.846 kg)  SpO2: 94%   Body mass index is 19.02 kg/(m^2).  Physical Exam  Constitutional: She is oriented to person,  place, and time. She appears well-developed and well-nourished. No distress.  HENT:  Head: Normocephalic.  Right Ear: External ear normal.  Left Ear: External ear normal.  Nose: Nose normal.  Mouth/Throat: Oropharynx is clear and moist. No oropharyngeal exudate.  Eyes: Conjunctivae are normal. Right eye exhibits no discharge. Left eye exhibits no discharge. No scleral icterus.  Difficulty looking down followed by an upgaze ?palsy  Neck: Normal range of motion. Neck supple. No JVD present. No tracheal deviation present. No thyromegaly present.  Cardiovascular: Normal rate, regular rhythm, normal heart sounds and intact distal pulses.   No murmur heard. Pulmonary/Chest: Effort normal and breath sounds normal. She has no wheezes. She has no rales. She exhibits no tenderness.  Abdominal: Soft. Bowel sounds are normal. She exhibits no distension. There is no tenderness.  Musculoskeletal: Normal range of motion. She exhibits no edema or tenderness.  Stiffness limbs  Neurological: She is alert and oriented to person, place, and time. She displays normal reflexes. No cranial nerve deficit. She exhibits normal muscle tone. Coordination abnormal.  Skin: Skin is warm and dry. No rash noted. She is not diaphoretic. No erythema. No pallor.  A large furuncle lower back  Psychiatric: She has a normal mood and affect. Her speech is normal and behavior is normal. Judgment and thought content normal. Cognition and memory are impaired. She does not express impulsivity or inappropriate judgment. She exhibits abnormal recent memory. She exhibits normal remote memory.    Labs reviewed: Basic Metabolic Panel:  Recent Labs  09/08/14  NA 139  K 4.2  BUN 24*  CREATININE 1.0    Liver Function Tests:  Recent Labs  09/08/14  AST 19  ALT 13  ALKPHOS 63    CBC:  Recent Labs  09/08/14  WBC 6.4  HGB 12.7  HCT 38  PLT 241    Lab Results  Component Value Date   TSH 3.22 09/08/2014   No results  found for: HGBA1C Lab Results  Component Value Date   CHOL * 10/14/2009    213        ATP III CLASSIFICATION:  <200     mg/dL   Desirable  200-239  mg/dL   Borderline High  >=240    mg/dL   High          HDL 64 10/14/2009   LDLCALC * 10/14/2009    132        Total Cholesterol/HDL:CHD Risk Coronary Heart Disease Risk Table                     Men   Women  1/2 Average Risk   3.4   3.3  Average Risk       5.0   4.4  2 X Average Risk   9.6   7.1  3 X Average Risk  23.4   11.0  Use the calculated Patient Ratio above and the CHD Risk Table to determine the patient's CHD Risk.        ATP III CLASSIFICATION (LDL):  <100     mg/dL   Optimal  100-129  mg/dL   Near or Above                    Optimal  130-159  mg/dL   Borderline  160-189  mg/dL   High  >190     mg/dL   Very High   TRIG 87 10/14/2009   CHOLHDL 3.3 10/14/2009    Significant Diagnostic Results since last visit: none  Patient Care Team: Estill Dooms, MD as PCP - General (Internal Medicine) Man Mast X, NP as Nurse Practitioner (Nurse Practitioner) [facility] Kathrynn Ducking, MD as Consulting Physician (Neurology) Luberta Mutter, MD as Consulting Physician (Ophthalmology) Kristeen Miss, MD as Consulting Physician (Neurosurgery)  Assessment/Plan Problem List Items Addressed This Visit    Progressive supranuclear palsy [facility]) - Primary (Chronic)    10/05/14 Neurology: continue ASA 81mg , does not report swallowing difficulty at this time, make sure she is well hydrated. -- no significant change.      Dysphagia, oropharyngeal phase (Chronic)    Modify diet. Risk for aspiration.      Cerebrovascular disease    Noted on CT and MRI of the brain 10/14/09, requires more assistance with ADLs in SNF, no focal weakness.       Furuncle of back, except buttock    Healed.           Family/ staff Communication: continue SNF for continue care needs.   Labs/tests ordered: none   ManXie Mast  NP Geriatrics [facility] [address], [place] [address] On Call:  [phone] & follow prompts after 5pm & weekends Office Phone:  [phone] Office Fax:  [phone]

## 2015-06-07 NOTE — Assessment & Plan Note (Signed)
Modify diet. Risk for aspiration.    

## 2015-06-10 ENCOUNTER — Encounter: Payer: Self-pay | Admitting: Nurse Practitioner

## 2015-06-10 ENCOUNTER — Non-Acute Institutional Stay (SKILLED_NURSING_FACILITY): Payer: Medicare Other | Admitting: Nurse Practitioner

## 2015-06-10 DIAGNOSIS — G231 Progressive supranuclear ophthalmoplegia [Steele-Richardson-Olszewski]: Secondary | ICD-10-CM | POA: Diagnosis not present

## 2015-06-10 DIAGNOSIS — I679 Cerebrovascular disease, unspecified: Secondary | ICD-10-CM

## 2015-06-10 DIAGNOSIS — R1312 Dysphagia, oropharyngeal phase: Secondary | ICD-10-CM | POA: Diagnosis not present

## 2015-06-10 DIAGNOSIS — J069 Acute upper respiratory infection, unspecified: Secondary | ICD-10-CM

## 2015-06-10 DIAGNOSIS — J189 Pneumonia, unspecified organism: Secondary | ICD-10-CM | POA: Insufficient documentation

## 2015-06-10 NOTE — Progress Notes (Signed)
Patient ID: Monica Waller, female   DOB: 07-31-1923, 82 y.o.   MRN: [id number]  Location:  SNF [facility] Provider:  Marlana Latus NP  Code Status:  DNR Goals of care: Advanced Directive information Does patient have an advance directive?: Yes, Type of Advance Directive: [place];Out of facility DNR (pink MOST or yellow form)  Chief Complaint  Patient presents with  . Acute Visit    Cough     HPI: Patient is a 82 y.o. female seen in the SNF at [facility] today for evaluation of cough, low grade T100, running nose, no O2 desaturation or sputum production. Hx of progressive supra nuclear palsy, dysphagia, cerebrovascular disease.   Review of Systems:  Review of Systems  Constitutional: Positive for fever. Negative for weight loss and malaise/fatigue.  HENT: Positive for congestion and hearing loss. Negative for ear pain.   Eyes: Negative for pain and discharge.  Respiratory: Positive for cough. Negative for sputum production.   Cardiovascular: Negative for chest pain, palpitations and leg swelling.  Gastrointestinal: Negative.  Negative for abdominal pain and constipation.  Genitourinary: Negative.  Negative for dysuria.  Musculoskeletal: Negative for back pain, joint pain and neck pain.       Unstable gait. History of multiple falls.  Skin:       History right mastectomy for breast cancer A large furuncle lower back    Neurological: Negative for dizziness, tremors, speech change, loss of consciousness and weakness.       Progressive supranuclear palsy. Ataxia. Unstable gait. Clumsy right leg and foot.  Endo/Heme/Allergies: Negative for polydipsia.  Psychiatric/Behavioral: Positive for memory loss. Negative for depression. The patient is not nervous/anxious and does not have insomnia.     Past Medical History  Diagnosis Date  . Cerebral degeneration   . Ataxia   . Gait disorder   . Progressive supranuclear palsy ([place])   . Subdural hematoma (HCC)     Hx.  of a left frontal  . 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 Other generalized ischemic cerebrovascular disease 10/14/09    Noted on CT and MR brain 10/14/09  . Dysphagia, oropharyngeal phase   . Cancer ([place])     Breast  . Malignant neoplasm of breast (female), unspecified site   . Central retinal artery occlusion 12/23/2013    Left eye  . Supranuclear palsy     Patient Active Problem List   Diagnosis Date Noted  . Acute upper respiratory infection 06/10/2015  . Furuncle of back, except buttock 05/06/2015  . C1 cervical fracture ([place]) 02/04/2014  . DNR (do not resuscitate) 02/02/2014  . Central retinal artery occlusion, left eye 12/25/2013  . Dysphagia, oropharyngeal phase   . Malignant neoplasm of breast (female), unspecified site   . Gait disorder   . UTI (lower urinary tract infection) 01/18/2013  . Fall 01/18/2013  . Anemia 06/04/2012  . Abnormality of gait 06/04/2012  . Progressive supranuclear palsy ([place]) 06/04/2012  . Cerebrovascular disease 10/14/2009  . 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   ASPIRIN 81 MG TABLET    Take 81 mg by mouth daily.   CALCIUM-VITAMIN D (CALTRATE 600 PLUS-VIT D  PO)    Take 1 tablet by mouth daily.   MULTIVITAMIN-IRON-MINERALS-FOLIC ACID (CENTRUM) CHEWABLE TABLET    Chew 1 tablet by mouth daily.   SENNA (SENOKOT) 8.6 MG TABLET    Take 1 tablet by mouth 2 (two) times daily.  Modified Medications   No medications on file  Discontinued Medications   No medications on file    Physical Exam: Filed Vitals:   06/10/15 1143  BP: 92/60  Pulse: 64  Temp: 97 F (36.1 C)  TempSrc: Oral  Resp: 16  Weight: 114 lb 4.8 oz (51.846 kg)  SpO2: 94%   Body mass index  is 19.02 kg/(m^2).  Physical Exam  Constitutional: She is oriented to person, place, and time. She appears well-developed and well-nourished. No distress.  HENT:  Head: Normocephalic.  Right Ear: External ear normal.  Left Ear: External ear normal.  Nose: Nose normal.  Mouth/Throat: Oropharynx is clear and moist. No oropharyngeal exudate.  Eyes: Conjunctivae are normal. Right eye exhibits no discharge. Left eye exhibits no discharge. No scleral icterus.  Difficulty looking down followed by an upgaze ?palsy  Neck: Normal range of motion. Neck supple. No JVD present. No tracheal deviation present. No thyromegaly present.  Cardiovascular: Normal rate, regular rhythm, normal heart sounds and intact distal pulses.   No murmur heard. Pulmonary/Chest: Effort normal and breath sounds normal. She has no wheezes. She has no rales. She exhibits no tenderness.  Abdominal: Soft. Bowel sounds are normal. She exhibits no distension. There is no tenderness.  Musculoskeletal: Normal range of motion. She exhibits no edema or tenderness.  Stiffness limbs  Neurological: She is alert and oriented to person, place, and time. She displays normal reflexes. No cranial nerve deficit. She exhibits normal muscle tone. Coordination abnormal.  Skin: Skin is warm and dry. No rash noted. She is not diaphoretic. No erythema. No pallor.  A large furuncle lower back  Psychiatric: She has a normal mood and affect. Her speech is normal and behavior is normal. Judgment and thought content normal. Cognition and memory are impaired. She does not express impulsivity or inappropriate judgment. She exhibits abnormal recent memory. She exhibits normal remote memory.    Labs reviewed: Basic Metabolic Panel:  Recent Labs  09/08/14  NA 139  K 4.2  BUN 24*  CREATININE 1.0    Liver Function Tests:  Recent Labs  09/08/14  AST 19  ALT 13  ALKPHOS 63    CBC:  Recent Labs  09/08/14  WBC 6.4  HGB 12.7  HCT 38  PLT 241     Lab Results  Component Value Date   TSH 3.22 09/08/2014   No results found for: HGBA1C Lab Results  Component Value Date   CHOL * 10/14/2009    213        ATP III CLASSIFICATION:  <200     mg/dL   Desirable  200-239  mg/dL   Borderline High  >=240    mg/dL   High          HDL 64 10/14/2009   LDLCALC * 10/14/2009    132        Total Cholesterol/HDL:CHD Risk Coronary Heart Disease Risk Table                     Men   Women  1/2 Average Risk   3.4   3.3  Average Risk       5.0   4.4  2 X Average Risk  9.6   7.1  3 X Average Risk  23.4   11.0        Use the calculated Patient Ratio above and the CHD Risk Table to determine the patient's CHD Risk.        ATP III CLASSIFICATION (LDL):  <100     mg/dL   Optimal  100-129  mg/dL   Near or Above                    Optimal  130-159  mg/dL   Borderline  160-189  mg/dL   High  >190     mg/dL   Very High   TRIG 87 10/14/2009   CHOLHDL 3.3 10/14/2009    Significant Diagnostic Results since last visit: none  Patient Care Team: Estill Dooms, MD as PCP - General (Internal Medicine) Man Mast X, NP as Nurse Practitioner (Nurse Practitioner) [facility] Kathrynn Ducking, MD as Consulting Physician (Neurology) Luberta Mutter, MD as Consulting Physician (Ophthalmology) Kristeen Miss, MD as Consulting Physician (Neurosurgery)  Assessment/Plan Problem List Items Addressed This Visit    Progressive supranuclear palsy [facility]) (Chronic)    10/05/14 Neurology: continue ASA 81mg , does not report swallowing difficulty at this time, make sure she is well hydrated. -- no significant change.      Dysphagia, oropharyngeal phase (Chronic)    Modify diet. Risk for aspiration.      Cerebrovascular disease    Noted on CT and MRI of the brain 10/14/09, requires more assistance with ADLs in SNF, no focal weakness.       Acute upper respiratory infection - Primary    Running nose, non productive cough, T100, no O2 desaturation,  denied chest pain, coarse rales posterior mid lung. Obtain CXR, CBC, CMP, TSH, UA C/S, Augmentin 875mg  bid x 7 days. Florastor bid x7 days.           Family/ staff Communication: continue SNF for continue care needs.   Labs/tests ordered: CBC, CMP, TSH, UA C/S, CXR  [facility] Mast NP Geriatrics [facility] [address], [place] [address] On Call:  [phone] & follow prompts after 5pm & weekends Office Phone:  [phone] Office Fax:  [phone]

## 2015-06-10 NOTE — Assessment & Plan Note (Signed)
Running nose, non productive cough, T100, no O2 desaturation, denied chest pain, coarse rales posterior mid lung. Obtain CXR, CBC, CMP, TSH, UA C/S, Augmentin 875mg  bid x 7 days. Florastor bid x7 days.

## 2015-06-10 NOTE — Assessment & Plan Note (Signed)
Noted on CT and MRI of the brain 10/14/09, requires more assistance with ADLs in SNF, no focal weakness.

## 2015-06-10 NOTE — Assessment & Plan Note (Signed)
Modify diet. Risk for aspiration.    

## 2015-06-10 NOTE — Assessment & Plan Note (Signed)
10/05/14 Neurology: continue ASA 81mg, does not report swallowing difficulty at this time, make sure she is well hydrated. -- no significant change.  

## 2015-06-11 LAB — BASIC METABOLIC PANEL
BUN: 23 mg/dL — AB (ref 4–21)
CREATININE: 1 mg/dL (ref 0.5–1.1)
GLUCOSE: 116 mg/dL
POTASSIUM: 4.5 mmol/L (ref 3.4–5.3)
Sodium: 142 mmol/L (ref 137–147)

## 2015-06-11 LAB — HEPATIC FUNCTION PANEL
ALT: 9 U/L (ref 7–35)
AST: 14 U/L (ref 13–35)
Alkaline Phosphatase: 68 U/L (ref 25–125)
Bilirubin, Total: 0.4 mg/dL

## 2015-06-11 LAB — CBC AND DIFFERENTIAL
HCT: 38 % (ref 36–46)
Hemoglobin: 13.2 g/dL (ref 12.0–16.0)
PLATELETS: 292 10*3/uL (ref 150–399)
WBC: 7.4 10*3/mL

## 2015-06-11 LAB — TSH: TSH: 2.07 u[IU]/mL (ref 0.41–5.90)

## 2015-06-14 ENCOUNTER — Encounter: Payer: Self-pay | Admitting: Nurse Practitioner

## 2015-07-01 ENCOUNTER — Encounter: Payer: Self-pay | Admitting: Nurse Practitioner

## 2015-07-01 ENCOUNTER — Non-Acute Institutional Stay (SKILLED_NURSING_FACILITY): Payer: Medicare Other | Admitting: Nurse Practitioner

## 2015-07-01 DIAGNOSIS — G231 Progressive supranuclear ophthalmoplegia [Steele-Richardson-Olszewski]: Secondary | ICD-10-CM

## 2015-07-01 DIAGNOSIS — J189 Pneumonia, unspecified organism: Secondary | ICD-10-CM | POA: Diagnosis not present

## 2015-07-01 DIAGNOSIS — D649 Anemia, unspecified: Secondary | ICD-10-CM

## 2015-07-01 DIAGNOSIS — R1312 Dysphagia, oropharyngeal phase: Secondary | ICD-10-CM

## 2015-07-01 DIAGNOSIS — I679 Cerebrovascular disease, unspecified: Secondary | ICD-10-CM

## 2015-07-01 NOTE — Assessment & Plan Note (Signed)
Modify diet. Risk for aspiration.    

## 2015-07-01 NOTE — Assessment & Plan Note (Signed)
Noted on CT and MRI of the brain 10/14/09, requires more assistance with ADLs in SNF, no focal weakness.

## 2015-07-01 NOTE — Assessment & Plan Note (Signed)
B 12 Deficiency 06/11/15 Hgb 13.2

## 2015-07-01 NOTE — Assessment & Plan Note (Signed)
Fully treated and resolved: Running nose, non productive cough, T100, no O2 desaturation, denied chest pain, coarse rales posterior mid lung. Obtain CXR, CBC, CMP, TSH, UA C/S, Augmentin 875mg  bid x 7 days. Florastor bid x7 days.

## 2015-07-01 NOTE — Progress Notes (Signed)
Patient ID: Monica Waller, female   DOB: 09/30/1923, 82 y.o.   MRN: [id number]  Location:  [place] Room Number: [id number] Place of Service:  SNF (31) Provider: Lennie Odor Adien Kimmel NP  GREEN, Viviann Spare, MD  Patient Care Team: Estill Dooms, MD as PCP - General (Internal Medicine) Blaine Guiffre X, NP as Nurse Practitioner (Nurse Practitioner) [facility] Kathrynn Ducking, MD as Consulting Physician (Neurology) Luberta Mutter, MD as Consulting Physician (Ophthalmology) Kristeen Miss, MD as Consulting Physician (Neurosurgery)  Extended Emergency Contact Information Primary Emergency Contact: Romie Jumper Address: [address]          [place], [place] Phone: [phone] Relation: Neighbor Secondary Emergency Contact: [place] Phone: [phone] Relation: Other  Code Status:  DNR Goals of care: Advanced Directive information Advanced Directives 07/01/2015  Does patient have an advance directive? Yes  Type of Advance Directive Living will;Healthcare Power of [place];Out of facility DNR (pink MOST or yellow form)  Copy of advanced directive(s) in chart? Yes     Chief Complaint  Patient presents with  . Medical Management of Chronic Issues    Routine Visit    HPI:  Pt is a 82 y.o. female seen today for medical management of chronic diseases.  Hx of progressive supra nuclear palsy, dysphagia, cerebrovascular disease.    Past Medical History  Diagnosis Date  . Cerebral degeneration   . Ataxia   . Gait disorder   . Progressive supranuclear palsy ([place])   . Subdural hematoma (HCC)     Hx. of a left frontal  . 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 Other generalized ischemic cerebrovascular disease 10/14/09    Noted on CT and MR brain 10/14/09  . Dysphagia, oropharyngeal phase   . Cancer ([place])     Breast  . Malignant neoplasm of breast (female),  unspecified site   . Central retinal artery occlusion 12/23/2013    Left eye  . Supranuclear palsy    Past Surgical History  Procedure Laterality Date  . Mastectomy Right 1998    Dr. Rebekah Chesterfield  . Tonsillectomy    . Cataract extraction, bilateral      No Known Allergies    Medication List       This list is accurate as of: 07/01/15 12:40 PM.  Always use your most recent med list.               acetaminophen 325 MG tablet  Commonly known as:  TYLENOL  Take 2 tablets (650 mg total) by mouth every 6 (six) hours as needed.     aspirin 81 MG tablet  Take 81 mg by mouth daily.     CALTRATE 600 PLUS-VIT D PO  Take 1 tablet by mouth daily.     multivitamin-iron-minerals-folic acid chewable tablet  Chew 1 tablet by mouth daily.     senna 8.6 MG tablet  Commonly known as:  SENOKOT  Take 1 tablet by mouth 2 (two) times daily.        Review of Systems  Constitutional: Negative for fever.  HENT: Positive for hearing loss. Negative for congestion and ear pain.   Eyes: Negative for pain and discharge.  Respiratory: Negative for cough.   Cardiovascular: Negative for chest pain, palpitations and leg swelling.  Gastrointestinal: Negative.  Negative for abdominal pain and constipation.  Endocrine: Negative for  polydipsia.  Genitourinary: Negative.  Negative for dysuria.  Musculoskeletal: Negative for back pain and neck pain.       Unstable gait. History of multiple falls.  Skin:       History right mastectomy for breast cancer     Neurological: Negative for dizziness, tremors and weakness.       Progressive supranuclear palsy. Ataxia. Unstable gait. Clumsy right leg and foot.  Psychiatric/Behavioral: The patient is not nervous/anxious.     Immunization History  Administered Date(s) Administered  . Influenza Whole 02/09/2013  . Influenza-Unspecified 01/26/2014, 01/28/2015  . Tdap 11/04/2012   Pertinent  Health Maintenance Due  Topic Date Due  . DEXA SCAN  06/06/1990  .  PNA vac Low Risk Adult (1 of 2 - PCV13) 06/06/1990  . INFLUENZA VACCINE  11/22/2015   Fall Risk  01/03/2015 07/09/2014 07/09/2014 12/15/2013 02/17/2013  Falls in the past year? No Yes Yes Yes Yes  Number falls in past yr: - 2 or more 2 or more 2 or more 2 or more  Injury with Fall? - Yes Yes - -  Risk Factor Category  - - High Fall Risk - -  Risk for fall due to : History of fall(s);Impaired balance/gait History of fall(s);Impaired balance/gait History of fall(s);Impaired balance/gait;Impaired mobility History of fall(s);Impaired balance/gait Impaired balance/gait  Follow up - - Falls evaluation completed - -   Functional Status Survey:    Filed Vitals:   07/01/15 0858  BP: 117/58  Pulse: 60  Temp: 98.1 F (36.7 C)  TempSrc: Oral  Resp: 20  Height: 5\' 5"  (1.651 m)  Weight: 113 lb 11.2 oz (51.574 kg)   Body mass index is 18.92 kg/(m^2). Physical Exam  Constitutional: She is oriented to person, place, and time. She appears well-developed and well-nourished. No distress.  HENT:  Head: Normocephalic.  Right Ear: External ear normal.  Left Ear: External ear normal.  Nose: Nose normal.  Mouth/Throat: Oropharynx is clear and moist. No oropharyngeal exudate.  Eyes: Conjunctivae are normal. Right eye exhibits no discharge. Left eye exhibits no discharge. No scleral icterus.  Difficulty looking down followed by an upgaze ?palsy  Neck: Normal range of motion. Neck supple. No JVD present. No tracheal deviation present. No thyromegaly present.  Cardiovascular: Normal rate, regular rhythm, normal heart sounds and intact distal pulses.   No murmur heard. Pulmonary/Chest: Effort normal and breath sounds normal. She has no wheezes. She has no rales. She exhibits no tenderness.  Abdominal: Soft. Bowel sounds are normal. She exhibits no distension. There is no tenderness.  Musculoskeletal: Normal range of motion. She exhibits no edema or tenderness.  Stiffness limbs  Neurological: She is alert  and oriented to person, place, and time. She displays normal reflexes. No cranial nerve deficit. She exhibits normal muscle tone. Coordination abnormal.  Skin: Skin is warm and dry. No rash noted. She is not diaphoretic. No erythema. No pallor.  Psychiatric: She has a normal mood and affect. Her speech is normal and behavior is normal. Judgment and thought content normal. Cognition and memory are impaired. She does not express impulsivity or inappropriate judgment. She exhibits abnormal recent memory. She exhibits normal remote memory.    Labs reviewed:  Recent Labs  09/08/14  NA 139  K 4.2  BUN 24*  CREATININE 1.0    Recent Labs  09/08/14  AST 19  ALT 13  ALKPHOS 63    Recent Labs  09/08/14  WBC 6.4  HGB 12.7  HCT 38  PLT 241  Lab Results  Component Value Date   TSH 3.22 09/08/2014   No results found for: HGBA1C Lab Results  Component Value Date   CHOL * 10/14/2009    213        ATP III CLASSIFICATION:  <200     mg/dL   Desirable  200-239  mg/dL   Borderline High  >=240    mg/dL   High          HDL 64 10/14/2009   LDLCALC * 10/14/2009    132        Total Cholesterol/HDL:CHD Risk Coronary Heart Disease Risk Table                     Men   Women  1/2 Average Risk   3.4   3.3  Average Risk       5.0   4.4  2 X Average Risk   9.6   7.1  3 X Average Risk  23.4   11.0        Use the calculated Patient Ratio above and the CHD Risk Table to determine the patient's CHD Risk.        ATP III CLASSIFICATION (LDL):  <100     mg/dL   Optimal  100-129  mg/dL   Near or Above                    Optimal  130-159  mg/dL   Borderline  160-189  mg/dL   High  >190     mg/dL   Very High   TRIG 87 10/14/2009   CHOLHDL 3.3 10/14/2009    Significant Diagnostic Results in last 30 days:  No results found.  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   Dysphagia, oropharyngeal phase Modify diet. Risk for aspiration.  Progressive supranuclear palsy ([place]) 10/05/14 Neurology: continue ASA 81mg , does not report swallowing difficulty at this time, make sure she is well hydrated. -- no significant change.    Family/ staff Communication: continue SNF for care needs.   Labs/tests ordered:  none

## 2015-07-01 NOTE — Assessment & Plan Note (Signed)
10/05/14 Neurology: continue ASA 81mg, does not report swallowing difficulty at this time, make sure she is well hydrated. -- no significant change.  

## 2015-07-12 ENCOUNTER — Encounter: Payer: Self-pay | Admitting: Nurse Practitioner

## 2015-07-12 ENCOUNTER — Non-Acute Institutional Stay (SKILLED_NURSING_FACILITY): Payer: Medicare Other | Admitting: Nurse Practitioner

## 2015-07-12 DIAGNOSIS — R1312 Dysphagia, oropharyngeal phase: Secondary | ICD-10-CM

## 2015-07-12 DIAGNOSIS — D649 Anemia, unspecified: Secondary | ICD-10-CM | POA: Diagnosis not present

## 2015-07-12 DIAGNOSIS — G231 Progressive supranuclear ophthalmoplegia [Steele-Richardson-Olszewski]: Secondary | ICD-10-CM | POA: Diagnosis not present

## 2015-07-12 DIAGNOSIS — I679 Cerebrovascular disease, unspecified: Secondary | ICD-10-CM

## 2015-07-12 NOTE — Progress Notes (Signed)
Patient ID: Monica Waller, female   DOB: 07/31/1923, 82 y.o.   MRN: [id number]  Location:  [facility] Room Number: N 18 Place of Service:  SNF (31) Provider: Lennie Odor Mast NP  GREEN, Viviann Spare, MD  Patient Care Team: Estill Dooms, MD as PCP - General (Internal Medicine) Man Mast X, NP as Nurse Practitioner (Nurse Practitioner) [facility] Kathrynn Ducking, MD as Consulting Physician (Neurology) Luberta Mutter, MD as Consulting Physician (Ophthalmology) Kristeen Miss, MD as Consulting Physician (Neurosurgery)  Extended Emergency Contact Information Primary Emergency Contact: Romie Jumper Address: [place]          [place], [place] Phone: [phone] Relation: Neighbor Secondary Emergency Contact: [place] of [place] Phone: [phone] Relation: Other  Code Status:  DNR Goals of care: Advanced Directive information Advanced Directives 07/12/2015  Does patient have an advance directive? Yes  Type of Advance Directive Living will;Healthcare Power of [place];Out of facility DNR (pink MOST or yellow form)  Does patient want to make changes to advanced directive? No - Patient declined  Copy of advanced directive(s) in chart? Yes     Chief Complaint  Patient presents with  . Acute Visit    skin tear right hip    HPI:  Pt is a 82 y.o. female seen today for medical management of chronic diseases.  Hx of progressive supra nuclear palsy, dysphagia, cerebrovascular disease.    Sustained a skin tear of the right hip, uncertain etiology, no s/s of infection, able to bear weight on the right hip w/o excessive pain, no reduced ROM of the right hip.    Past Medical History  Diagnosis Date  . Cerebral degeneration   . Ataxia   . Gait disorder   . Progressive supranuclear palsy ([place])   . Subdural hematoma (HCC)     Hx. of a left frontal  . 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 Other generalized ischemic cerebrovascular disease 10/14/09    Noted on CT and MR brain 10/14/09  . Dysphagia, oropharyngeal phase   . Cancer ([place])     Breast  . Malignant neoplasm of breast (female), unspecified site   . Central retinal artery occlusion 12/23/2013    Left eye  . Supranuclear palsy    Past Surgical History  Procedure Laterality Date  . Mastectomy Right 1998    Dr. Rebekah Chesterfield  . Tonsillectomy    . Cataract extraction, bilateral      No Known Allergies    Medication List       This list is accurate as of: 07/12/15 12:30 PM.  Always use your most recent med list.               acetaminophen 325 MG tablet  Commonly known as:  TYLENOL  Take 2 tablets (650 mg total) by mouth every 6 (six) hours as needed.     aspirin 81 MG tablet  Take 81 mg by mouth daily.     CALTRATE 600 PLUS-VIT D PO  Take 1 tablet by mouth daily.     multivitamin-iron-minerals-folic acid chewable tablet  Chew 1 tablet by mouth daily.     senna 8.6 MG tablet  Commonly known as:  SENOKOT  Take 1 tablet by mouth 2 (two) times daily.        Review of Systems  Constitutional: Negative for fever.  HENT: Positive for hearing loss. Negative for congestion and ear pain.   Eyes: Negative for pain and discharge.  Respiratory: Negative for cough.   Cardiovascular: Negative for chest pain, palpitations and leg swelling.  Gastrointestinal: Negative.  Negative for abdominal pain and constipation.  Endocrine: Negative for polydipsia.  Genitourinary: Negative.  Negative for dysuria.  Musculoskeletal: Negative for back pain and neck pain.       Unstable gait. History of multiple falls.  Skin:       History right mastectomy for breast cancer The right hip skin tear, no s/s of infection.      Neurological: Negative for dizziness, tremors and weakness.       Progressive supranuclear palsy. Ataxia. Unstable gait. Clumsy right leg and foot.  Psychiatric/Behavioral: The  patient is not nervous/anxious.     Immunization History  Administered Date(s) Administered  . Influenza Whole 02/09/2013  . Influenza-Unspecified 01/26/2014, 01/28/2015  . Tdap 11/04/2012   Pertinent  Health Maintenance Due  Topic Date Due  . DEXA SCAN  06/06/1990  . PNA vac Low Risk Adult (1 of 2 - PCV13) 06/06/1990  . INFLUENZA VACCINE  11/22/2015   Fall Risk  01/03/2015 07/09/2014 07/09/2014 12/15/2013 02/17/2013  Falls in the past year? No Yes Yes Yes Yes  Number falls in past yr: - 2 or more 2 or more 2 or more 2 or more  Injury with Fall? - Yes Yes - -  Risk Factor Category  - - High Fall Risk - -  Risk for fall due to : History of fall(s);Impaired balance/gait History of fall(s);Impaired balance/gait History of fall(s);Impaired balance/gait;Impaired mobility History of fall(s);Impaired balance/gait Impaired balance/gait  Follow up - - Falls evaluation completed - -   Functional Status Survey:    Filed Vitals:   07/12/15 1019  BP: 110/70  Pulse: 68  Temp: 97 F (36.1 C)  TempSrc: Oral  Height: 5\' 5"  (1.651 m)  Weight: 113 lb 11.2 oz (51.574 kg)  SpO2: 95%   Body mass index is 18.92 kg/(m^2). Physical Exam  Constitutional: She is oriented to person, place, and time. She appears well-developed and well-nourished. No distress.  HENT:  Head: Normocephalic.  Right Ear: External ear normal.  Left Ear: External ear normal.  Nose: Nose normal.  Mouth/Throat: Oropharynx is clear and moist. No oropharyngeal exudate.  Eyes: Conjunctivae are normal. Right eye exhibits no discharge. Left eye exhibits no discharge. No scleral icterus.  Difficulty looking down followed by an upgaze ?palsy  Neck: Normal range of motion. Neck supple. No JVD present. No tracheal deviation present. No thyromegaly present.  Cardiovascular: Normal rate, regular rhythm, normal heart sounds and intact distal pulses.   No murmur heard. Pulmonary/Chest: Effort normal and breath sounds normal. She has no  wheezes. She has no rales. She exhibits no tenderness.  Abdominal: Soft. Bowel sounds are normal. She exhibits no distension. There is no tenderness.  Musculoskeletal: Normal range of motion. She exhibits no edema or tenderness.  Stiffness limbs  Neurological: She is alert and oriented to person, place, and time. She displays normal reflexes. No cranial nerve deficit. She exhibits normal muscle tone. Coordination abnormal.  Skin: Skin is warm and dry. No rash noted. She is not diaphoretic. No erythema. No pallor.  History right mastectomy for breast cancer The right hip skin tear, no s/s of infection.      Psychiatric: She has a normal mood and affect. Her speech is normal and behavior is normal. Judgment and thought content normal. Cognition  and memory are impaired. She does not express impulsivity or inappropriate judgment. She exhibits abnormal recent memory. She exhibits normal remote memory.    Labs reviewed:  Recent Labs  09/08/14 06/11/15  NA 139 142  K 4.2 4.5  BUN 24* 23*  CREATININE 1.0 1.0    Recent Labs  09/08/14 06/11/15  AST 19 14  ALT 13 9  ALKPHOS 63 68    Recent Labs  09/08/14 06/11/15  WBC 6.4 7.4  HGB 12.7 13.2  HCT 38 38  PLT 241 292   Lab Results  Component Value Date   TSH 2.07 06/11/2015   No results found for: HGBA1C Lab Results  Component Value Date   CHOL * 10/14/2009    213        ATP III CLASSIFICATION:  <200     mg/dL   Desirable  200-239  mg/dL   Borderline High  >=240    mg/dL   High          HDL 64 10/14/2009   LDLCALC * 10/14/2009    132        Total Cholesterol/HDL:CHD Risk Coronary Heart Disease Risk Table                     Men   Women  1/2 Average Risk   3.4   3.3  Average Risk       5.0   4.4  2 X Average Risk   9.6   7.1  3 X Average Risk  23.4   11.0        Use the calculated Patient Ratio above and the CHD Risk Table to determine the patient's CHD Risk.        ATP III CLASSIFICATION (LDL):  <100     mg/dL    Optimal  100-129  mg/dL   Near or Above                    Optimal  130-159  mg/dL   Borderline  160-189  mg/dL   High  >190     mg/dL   Very High   TRIG 87 10/14/2009   CHOLHDL 3.3 10/14/2009    Significant Diagnostic Results in last 30 days:  No results found.  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   Dysphagia, oropharyngeal phase Modify diet. Risk for aspiration.  Progressive supranuclear palsy ([place]) 10/05/14 Neurology: continue ASA 81mg , does not report swallowing difficulty at this time, make sure she is well hydrated. -- no significant change.    Family/ staff Communication: continue SNF for care needs.   Labs/tests ordered:  none

## 2015-07-12 NOTE — Assessment & Plan Note (Signed)
10/05/14 Neurology: continue ASA 81mg, does not report swallowing difficulty at this time, make sure she is well hydrated. -- no significant change.  

## 2015-07-12 NOTE — Assessment & Plan Note (Signed)
Noted on CT and MRI of the brain 10/14/09, requires more assistance with ADLs in SNF, no focal weakness.

## 2015-07-12 NOTE — Assessment & Plan Note (Signed)
Modify diet. Risk for aspiration.    

## 2015-07-12 NOTE — Assessment & Plan Note (Signed)
B 12 Deficiency 06/11/15 Hgb 13.2

## 2015-07-13 ENCOUNTER — Ambulatory Visit: Payer: Medicare Other | Admitting: Neurology

## 2015-07-29 ENCOUNTER — Encounter: Payer: Self-pay | Admitting: Internal Medicine

## 2015-07-29 ENCOUNTER — Non-Acute Institutional Stay (SKILLED_NURSING_FACILITY): Payer: Medicare Other | Admitting: Internal Medicine

## 2015-07-29 DIAGNOSIS — G231 Progressive supranuclear ophthalmoplegia [Steele-Richardson-Olszewski]: Secondary | ICD-10-CM

## 2015-07-29 DIAGNOSIS — R1312 Dysphagia, oropharyngeal phase: Secondary | ICD-10-CM

## 2015-07-29 DIAGNOSIS — J189 Pneumonia, unspecified organism: Secondary | ICD-10-CM

## 2015-07-29 NOTE — Progress Notes (Signed)
Patient ID: Monica Waller, female   DOB: 1923-06-17, 82 y.o.   MRN: [id number]  Location:  [facility] Room Number: 41 Place of Service:  SNF (31) Provider: Estill Dooms, MD  Patient Care Team: Estill Dooms, MD as PCP - General (Internal Medicine) Man Mast X, NP as Nurse Practitioner (Nurse Practitioner) [facility] Kathrynn Ducking, MD as Consulting Physician (Neurology) Luberta Mutter, MD as Consulting Physician (Ophthalmology) Kristeen Miss, MD as Consulting Physician (Neurosurgery)  Extended Emergency Contact Information Primary Emergency Contact: Romie Jumper Address: [place]          [place], [place] Phone: [phone] Relation: Neighbor Secondary Emergency Contact: [place] of [place] Phone: [phone] Relation: Other  Code Status:  DO NOT RESUSCITATE Goals of care: Advanced Directive information Advanced Directives 07/29/2015  Does patient have an advance directive? Yes  Type of Paramedic of [place];Living will;Out of facility DNR (pink MOST or yellow form)  Does patient want to make changes to advanced directive? -  Copy of advanced directive(s) in chart? Yes  Pre-existing out of facility DNR order (yellow form or pink MOST form) Yellow form placed in chart (order not valid for inpatient use);Pink MOST form placed in chart (order not valid for inpatient use)     Chief Complaint  Patient presents with  . Medical Management of Chronic Issues    routine    HPI:  Pt is a 82 y.o. female seen today for medical management of chronic diseases.    Progressive supranuclear palsy (HCC) - remains stable. She is now non-walker. The supranuclear palsy affecting her swallowing difficulty. Mentally she seems to remain sharp. Psychiatrically she is calm.  Dysphagia, oropharyngeal phase  -continues with difficulty in swallowing. Coughs at meals. Intake is  diminished because of this. She was losing weight slowly..  Pneumonia involving right lung, unspecified part of lung - confirmed by x-ray 06/10/2015 and treated at that time. Possible aspiration pneumonia related to her dysphagia. Patient has recovered fully from this.     Past Medical History  Diagnosis Date  . Cerebral degeneration   . Ataxia   . Gait disorder   . Progressive supranuclear palsy ([place])   . Subdural hematoma (HCC)     Hx. of a left frontal  . 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 Other generalized ischemic cerebrovascular disease 10/14/09    Noted on CT and MR brain 10/14/09  . Dysphagia, oropharyngeal phase   . Cancer ([place])     Breast  . Malignant neoplasm of breast (female), unspecified site   . Central retinal artery occlusion 12/23/2013    Left eye  . Supranuclear palsy    Past Surgical History  Procedure Laterality Date  . Mastectomy Right 1998    Dr. Rebekah Chesterfield  . Tonsillectomy    . Cataract extraction, bilateral      No Known Allergies    Medication List       This list is accurate as of: 07/29/15 11:20 AM.  Always use your most recent med list.               acetaminophen 325 MG tablet  Commonly known as:  TYLENOL  Take 2 tablets (650 mg total) by mouth every 6 (six) hours as needed.     aspirin 81 MG tablet  Take 81 mg by mouth  daily.     bisacodyl 10 MG suppository  Commonly known as:  DULCOLAX  Place 10 mg rectally. One as needed for constipation     CALTRATE 600 PLUS-VIT D PO  Take 1 tablet by mouth daily.     multivitamin-iron-minerals-folic acid chewable tablet  Chew 1 tablet by mouth daily.     senna 8.6 MG tablet  Commonly known as:  SENOKOT  Take 1 tablet by mouth 2 (two) times daily.        Review of Systems  Constitutional: Negative for fever.  HENT: Positive for hearing loss. Negative for congestion and ear pain.   Eyes: Negative for pain and discharge.  Respiratory:  Negative for cough.   Cardiovascular: Negative for chest pain, palpitations and leg swelling.  Gastrointestinal: Negative.  Negative for abdominal pain and constipation.  Endocrine: Negative for polydipsia.  Genitourinary: Negative.  Negative for dysuria.  Musculoskeletal: Negative for back pain and neck pain.       No longer walking  patient is bedridden and wheelchair confined . History of multiple falls.  Skin:       History right mastectomy.  Neurological: Negative for dizziness, tremors and weakness.       Progressive supranuclear palsy. Ataxia. Dysarthria. Clumsy right leg and foot.  Psychiatric/Behavioral: The patient is not nervous/anxious.     Immunization History  Administered Date(s) Administered  . Influenza Whole 02/09/2013  . Influenza-Unspecified 01/26/2014, 01/28/2015  . Tdap 11/04/2012   Pertinent  Health Maintenance Due  Topic Date Due  . DEXA SCAN  06/06/1990  . PNA vac Low Risk Adult (1 of 2 - PCV13) 06/06/1990  . INFLUENZA VACCINE  11/22/2015   Fall Risk  01/03/2015 07/09/2014 07/09/2014 12/15/2013 02/17/2013  Falls in the past year? No Yes Yes Yes Yes  Number falls in past yr: - 2 or more 2 or more 2 or more 2 or more  Injury with Fall? - Yes Yes - -  Risk Factor Category  - - High Fall Risk - -  Risk for fall due to : History of fall(s);Impaired balance/gait History of fall(s);Impaired balance/gait History of fall(s);Impaired balance/gait;Impaired mobility History of fall(s);Impaired balance/gait Impaired balance/gait  Follow up - - Falls evaluation completed - -   Functional Status Survey:    Filed Vitals:   07/29/15 1114  BP: 112/72  Pulse: 70  Temp: 97.7 F (36.5 C)  Resp: 18  Height: 5\' 5"  (1.651 m)  Weight: 113 lb 11.2 oz (51.574 kg)   Body mass index is 18.92 kg/(m^2). Physical Exam  Constitutional: She is oriented to person, place, and time. She appears well-developed and well-nourished. No distress.  HENT:  Head: Normocephalic.  Right  Ear: External ear normal.  Left Ear: External ear normal.  Nose: Nose normal.  Mouth/Throat: Oropharynx is clear and moist. No oropharyngeal exudate.  Eyes: Conjunctivae are normal. Right eye exhibits no discharge. Left eye exhibits no discharge. No scleral icterus.  Difficulty looking down followed by an upgaze ?palsy  Neck: Normal range of motion. Neck supple. No JVD present. No tracheal deviation present. No thyromegaly present.  Cardiovascular: Normal rate, regular rhythm, normal heart sounds and intact distal pulses.   No murmur heard. Pulmonary/Chest: Effort normal and breath sounds normal. She has no wheezes. She has no rales. She exhibits no tenderness.  Abdominal: Soft. Bowel sounds are normal. She exhibits no distension. There is no tenderness.  Musculoskeletal: Normal range of motion. She exhibits no edema or tenderness.  Stiffness limbs  Neurological: She  is alert and oriented to person, place, and time. She displays normal reflexes. No cranial nerve deficit. She exhibits normal muscle tone. Coordination abnormal.  Dysarthria and dysphagia related to supranuclear palsy.  Skin: Skin is warm and dry. No rash noted. She is not diaphoretic. No erythema. No pallor.  History right mastectomy for breast cancer  Psychiatric: She has a normal mood and affect. Her speech is normal and behavior is normal. Judgment and thought content normal. Cognition and memory are impaired. She does not express impulsivity or inappropriate judgment. She exhibits abnormal recent memory. She exhibits normal remote memory.    Labs reviewed:  Recent Labs  09/08/14 06/11/15  NA 139 142  K 4.2 4.5  BUN 24* 23*  CREATININE 1.0 1.0    Recent Labs  09/08/14 06/11/15  AST 19 14  ALT 13 9  ALKPHOS 63 68    Recent Labs  09/08/14 06/11/15  WBC 6.4 7.4  HGB 12.7 13.2  HCT 38 38  PLT 241 292   Lab Results  Component Value Date   TSH 2.07 06/11/2015   No results found for: HGBA1C Lab Results    Component Value Date   CHOL * 10/14/2009    213        ATP III CLASSIFICATION:  <200     mg/dL   Desirable  200-239  mg/dL   Borderline High  >=240    mg/dL   High          HDL 64 10/14/2009   LDLCALC * 10/14/2009    132        Total Cholesterol/HDL:CHD Risk Coronary Heart Disease Risk Table                     Men   Women  1/2 Average Risk   3.4   3.3  Average Risk       5.0   4.4  2 X Average Risk   9.6   7.1  3 X Average Risk  23.4   11.0        Use the calculated Patient Ratio above and the CHD Risk Table to determine the patient's CHD Risk.        ATP III CLASSIFICATION (LDL):  <100     mg/dL   Optimal  100-129  mg/dL   Near or Above                    Optimal  130-159  mg/dL   Borderline  160-189  mg/dL   High  >190     mg/dL   Very High   TRIG 87 10/14/2009   CHOLHDL 3.3 10/14/2009   Assessment/Plan 1. Progressive supranuclear palsy (HCC) Stable  2. Dysphagia, oropharyngeal phase Continues with diminished intake secondary to this issue. Pneumonia in February 2017 may have been an aspiration pneumonia             3. Pneumonia involving right lung, unspecified part of lung Resolved

## 2015-08-30 ENCOUNTER — Non-Acute Institutional Stay (SKILLED_NURSING_FACILITY): Payer: Medicare Other | Admitting: Nurse Practitioner

## 2015-08-30 ENCOUNTER — Encounter: Payer: Self-pay | Admitting: Nurse Practitioner

## 2015-08-30 DIAGNOSIS — I679 Cerebrovascular disease, unspecified: Secondary | ICD-10-CM | POA: Diagnosis not present

## 2015-08-30 DIAGNOSIS — R1312 Dysphagia, oropharyngeal phase: Secondary | ICD-10-CM | POA: Diagnosis not present

## 2015-08-30 DIAGNOSIS — G231 Progressive supranuclear ophthalmoplegia [Steele-Richardson-Olszewski]: Secondary | ICD-10-CM

## 2015-08-30 NOTE — Assessment & Plan Note (Signed)
Noted on CT and MRI of the brain 10/14/09, requires more assistance with ADLs in SNF, no focal weakness.

## 2015-08-30 NOTE — Assessment & Plan Note (Signed)
Modify diet. Risk for aspiration.    

## 2015-08-30 NOTE — Progress Notes (Signed)
Patient ID: Monica Waller, female   DOB: [birth year], 82 y.o.   MRN: [id number]  Location:  [place] Room Number: N 18 Place of Service:  SNF (31) Provider: Lennie Odor Katrinia Straker NP  GREEN, Viviann Spare, MD  Patient Care Team: Estill Dooms, MD as PCP - General (Internal Medicine) Jeffry Vogelsang X, NP as Nurse Practitioner (Nurse Practitioner) [facility] Kathrynn Ducking, MD as Consulting Physician (Neurology) Luberta Mutter, MD as Consulting Physician (Ophthalmology) Kristeen Miss, MD as Consulting Physician (Neurosurgery)  Extended Emergency Contact Information Primary Emergency Contact: Romie Jumper Address: [address]          [place], [place] Phone: [phone] Relation: Neighbor Secondary Emergency Contact: [place] of [place] Phone: [phone] Relation: Other  Code Status:  DNR Goals of care: Advanced Directive information Advanced Directives 08/30/2015  Does patient have an advance directive? Yes  Type of Paramedic of [place];Living will;Out of facility DNR (pink MOST or yellow form)  Does patient want to make changes to advanced directive? No - Patient declined  Copy of advanced directive(s) in chart? Yes  Pre-existing out of facility DNR order (yellow form or pink MOST form) -     Chief Complaint  Patient presents with  . Medical Management of Chronic Issues    Routine visit    HPI:  Pt is a 82 y.o. female seen today for medical management of chronic diseases.  Hx of progressive supra nuclear palsy, dysphagia, cerebrovascular disease.    Past Medical History  Diagnosis Date  . Cerebral degeneration   . Ataxia   . Gait disorder   . Progressive supranuclear palsy ([place])   . Subdural hematoma (HCC)     Hx. of a left frontal  . 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 Other generalized ischemic cerebrovascular disease  10/14/09    Noted on CT and MR brain 10/14/09  . Dysphagia, oropharyngeal phase   . Cancer ([place])     Breast  . Malignant neoplasm of breast (female), unspecified site   . Central retinal artery occlusion 12/23/2013    Left eye  . Supranuclear palsy    Past Surgical History  Procedure Laterality Date  . Mastectomy Right 1998    Dr. Rebekah Chesterfield  . Tonsillectomy    . Cataract extraction, bilateral      No Known Allergies    Medication List       This list is accurate as of: 08/30/15  2:16 PM.  Always use your most recent med list.               acetaminophen 325 MG tablet  Commonly known as:  TYLENOL  Take 2 tablets (650 mg total) by mouth every 6 (six) hours as needed.     aspirin 81 MG tablet  Take 81 mg by mouth daily.     bisacodyl 10 MG suppository  Commonly known as:  DULCOLAX  Place 10 mg rectally. One as needed for constipation     CALTRATE 600 PLUS-VIT D PO  Take 1 tablet by mouth daily.     multivitamin-iron-minerals-folic acid chewable tablet  Chew 1 tablet by mouth daily.     senna 8.6 MG tablet  Commonly known as:  SENOKOT  Take 1 tablet by mouth 2 (two) times daily.        Review of Systems  Constitutional: Negative for fever.  HENT: Positive for hearing loss. Negative for congestion and ear pain.   Eyes: Negative for pain and discharge.  Respiratory: Negative for cough.   Cardiovascular: Negative for chest pain, palpitations and leg swelling.  Gastrointestinal: Negative.  Negative for abdominal pain and constipation.  Endocrine: Negative for polydipsia.  Genitourinary: Negative.  Negative for dysuria.  Musculoskeletal: Negative for back pain and neck pain.       Unstable gait. History of multiple falls.  Skin:       History right mastectomy for breast cancer      Neurological: Negative for dizziness, tremors and weakness.       Progressive supranuclear palsy. Ataxia. Unstable gait. Clumsy right leg and foot.  Psychiatric/Behavioral: The  patient is not nervous/anxious.     Immunization History  Administered Date(s) Administered  . Influenza Whole 02/09/2013  . Influenza-Unspecified 01/26/2014, 01/28/2015  . Tdap 11/04/2012   Pertinent  Health Maintenance Due  Topic Date Due  . DEXA SCAN  06/06/1990  . PNA vac Low Risk Adult (1 of 2 - PCV13) 06/06/1990  . INFLUENZA VACCINE  11/22/2015   Fall Risk  01/03/2015 07/09/2014 07/09/2014 12/15/2013 02/17/2013  Falls in the past year? No Yes Yes Yes Yes  Number falls in past yr: - 2 or more 2 or more 2 or more 2 or more  Injury with Fall? - Yes Yes - -  Risk Factor Category  - - High Fall Risk - -  Risk for fall due to : History of fall(s);Impaired balance/gait History of fall(s);Impaired balance/gait History of fall(s);Impaired balance/gait;Impaired mobility History of fall(s);Impaired balance/gait Impaired balance/gait  Follow up - - Falls evaluation completed - -   Functional Status Survey:    Filed Vitals:   08/30/15 1140  BP: 130/74  Pulse: 64  Temp: 96.8 F (36 C)  TempSrc: Oral  Resp: 20  Height:  (1.651 m)  Weight: 111 lb 6.4 oz (50.531 kg)   Body mass index is 18.54 kg/(m^2). Physical Exam  Constitutional: She is oriented to person, place, and time. She appears well-developed and well-nourished. No distress.  HENT:  Head: Normocephalic.  Right Ear: External ear normal.  Left Ear: External ear normal.  Nose: Nose normal.  Mouth/Throat: Oropharynx is clear and moist. No oropharyngeal exudate.  Eyes: Conjunctivae are normal. Right eye exhibits no discharge. Left eye exhibits no discharge. No scleral icterus.  Difficulty looking down followed by an upgaze ?palsy  Neck: Normal range of motion. Neck supple. No JVD present. No tracheal deviation present. No thyromegaly present.  Cardiovascular: Normal rate, regular rhythm, normal heart sounds and intact distal pulses.   No murmur heard. Pulmonary/Chest: Effort normal and breath sounds normal. She has no  wheezes. She has no rales. She exhibits no tenderness.  Abdominal: Soft. Bowel sounds are normal. She exhibits no distension. There is no tenderness.  Musculoskeletal: Normal range of motion. She exhibits no edema or tenderness.  Stiffness limbs  Neurological: She is alert and oriented to person, place, and time. She displays normal reflexes. No cranial nerve deficit. She exhibits normal muscle tone. Coordination abnormal.  Skin: Skin is warm and dry. No rash noted. She is not diaphoretic. No erythema. No pallor.  History right mastectomy for breast cancer     Psychiatric: She has a normal mood and affect. Her speech is normal and behavior is normal. Judgment and thought content normal. Cognition and memory are impaired. She does not express impulsivity or inappropriate judgment. She exhibits  abnormal recent memory. She exhibits normal remote memory.    Labs reviewed:  Recent Labs  09/08/14 06/11/15  NA 139 142  K 4.2 4.5  BUN 24* 23*  CREATININE 1.0 1.0    Recent Labs  09/08/14 06/11/15  AST 19 14  ALT 13 9  ALKPHOS 63 68    Recent Labs  09/08/14 06/11/15  WBC 6.4 7.4  HGB 12.7 13.2  HCT 38 38  PLT 241 292   Lab Results  Component Value Date   TSH 2.07 06/11/2015   No results found for: HGBA1C Lab Results  Component Value Date   CHOL * 10/14/2009    213        ATP III CLASSIFICATION:  <200     mg/dL   Desirable  200-239  mg/dL   Borderline High  >=240    mg/dL   High          HDL 64 10/14/2009   LDLCALC * 10/14/2009    132        Total Cholesterol/HDL:CHD Risk Coronary Heart Disease Risk Table                     Men   Women  1/2 Average Risk   3.4   3.3  Average Risk       5.0   4.4  2 X Average Risk   9.6   7.1  3 X Average Risk  23.4   11.0        Use the calculated Patient Ratio above and the CHD Risk Table to determine the patient's CHD Risk.        ATP III CLASSIFICATION (LDL):  <100     mg/dL   Optimal  100-129  mg/dL   Near or Above                     Optimal  130-159  mg/dL   Borderline  160-189  mg/dL   High  >190     mg/dL   Very High   TRIG 87 10/14/2009   CHOLHDL 3.3 10/14/2009    Significant Diagnostic Results in last 30 days:  No results found.  Assessment/Plan Cerebrovascular disease Noted on CT and MRI of the brain 10/14/09, requires more assistance with ADLs in SNF, no focal weakness.    Dysphagia, oropharyngeal phase Modify diet. Risk for aspiration.   Progressive supranuclear palsy ([place]) 10/05/14 Neurology: continue ASA 81mg , does not report swallowing difficulty at this time, make sure she is well hydrated. -- no significant change.     Family/ staff Communication: continue SNF for care needs.   Labs/tests ordered:  none

## 2015-08-30 NOTE — Assessment & Plan Note (Signed)
10/05/14 Neurology: continue ASA 81mg , does not report swallowing difficulty at this time, make sure she is well hydrated. -- no significant change.

## 2015-09-27 ENCOUNTER — Non-Acute Institutional Stay (SKILLED_NURSING_FACILITY): Payer: Medicare Other | Admitting: Nurse Practitioner

## 2015-09-27 ENCOUNTER — Encounter: Payer: Self-pay | Admitting: Nurse Practitioner

## 2015-09-27 DIAGNOSIS — G311 Senile degeneration of brain, not elsewhere classified: Secondary | ICD-10-CM | POA: Diagnosis not present

## 2015-09-27 DIAGNOSIS — I679 Cerebrovascular disease, unspecified: Secondary | ICD-10-CM

## 2015-09-27 DIAGNOSIS — R269 Unspecified abnormalities of gait and mobility: Secondary | ICD-10-CM | POA: Diagnosis not present

## 2015-09-27 DIAGNOSIS — G231 Progressive supranuclear ophthalmoplegia [Steele-Richardson-Olszewski]: Secondary | ICD-10-CM | POA: Diagnosis not present

## 2015-09-27 DIAGNOSIS — R1312 Dysphagia, oropharyngeal phase: Secondary | ICD-10-CM

## 2015-09-27 NOTE — Assessment & Plan Note (Signed)
Noted on CT and MRI of the brain 10/14/09, requires more assistance with ADLs in SNF

## 2015-09-27 NOTE — Assessment & Plan Note (Signed)
Noted on CT and MRI of the brain 10/14/09, requires more assistance with ADLs in SNF, no focal weakness.

## 2015-09-27 NOTE — Assessment & Plan Note (Signed)
Started June 2011. Patient has cerebral atrophy, cerebrovascular disease, and supranuclear palsy.

## 2015-09-27 NOTE — Assessment & Plan Note (Signed)
10/05/14 Neurology: continue ASA 81mg , does not report swallowing difficulty at this time, make sure she is well hydrated. -- no significant change.

## 2015-09-27 NOTE — Assessment & Plan Note (Signed)
Modify diet. Risk for aspiration.    

## 2015-09-27 NOTE — Progress Notes (Signed)
Patient ID: Monica Waller, female   DOB: 12/07/1923, 82 y.o.   MRN: [id number]  Location:  [place] Room Number: N 18 Place of Service:  SNF (31) Provider: Lennie Odor Larsen Dungan NP  GREEN, Viviann Spare, MD  Patient Care Team: Estill Dooms, MD as PCP - General (Internal Medicine) Benjy Kana X, NP as Nurse Practitioner (Nurse Practitioner) [facility] Kathrynn Ducking, MD as Consulting Physician (Neurology) Luberta Mutter, MD as Consulting Physician (Ophthalmology) Kristeen Miss, MD as Consulting Physician (Neurosurgery)  Extended Emergency Contact Information Primary Emergency Contact: Romie Jumper Address: [place]          [place], [place] of [place] Phone: [phone] Relation: Neighbor Secondary Emergency Contact: [place] of [place] Phone: [phone] Relation: Other  Code Status:  DNR Goals of care: Advanced Directive information Advanced Directives 09/27/2015  Does patient have an advance directive? Yes  Type of Paramedic of [place];Living will;Out of facility DNR (pink MOST or yellow form)  Does patient want to make changes to advanced directive? No - Patient declined  Copy of advanced directive(s) in chart? Yes     Chief Complaint  Patient presents with  . Medical Management of Chronic Issues    HPI:  Pt is a 82 y.o. female seen today for medical management of chronic diseases.  Hx of progressive supra nuclear palsy, dysphagia, cerebrovascular disease.    Past Medical History  Diagnosis Date  . Cerebral degeneration   . Ataxia   . Gait disorder   . Progressive supranuclear palsy ([facility])   . Subdural hematoma (HCC)     Hx. of a left frontal  . 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 Other generalized ischemic cerebrovascular disease 10/14/09    Noted on CT and MR brain 10/14/09  . Dysphagia, oropharyngeal phase   . Cancer  ([place])     Breast  . Malignant neoplasm of breast (female), unspecified site   . Central retinal artery occlusion 12/23/2013    Left eye  . Supranuclear palsy    Past Surgical History  Procedure Laterality Date  . Mastectomy Right 1998    Dr. Rebekah Chesterfield  . Tonsillectomy    . Cataract extraction, bilateral      No Known Allergies    Medication List       This list is accurate as of: 09/27/15  4:13 PM.  Always use your most recent med list.               acetaminophen 325 MG tablet  Commonly known as:  TYLENOL  Take 2 tablets (650 mg total) by mouth every 6 (six) hours as needed.     aspirin 81 MG tablet  Take 81 mg by mouth daily.     bisacodyl 10 MG suppository  Commonly known as:  DULCOLAX  Place 10 mg rectally. One as needed for constipation     CALTRATE 600 PLUS-VIT D PO  Take 1 tablet by mouth daily.     multivitamin-iron-minerals-folic acid chewable tablet  Chew 1 tablet by mouth daily.     senna 8.6 MG tablet  Commonly known as:  SENOKOT  Take 1 tablet by mouth 2 (two) times daily.        Review of Systems  Constitutional: Negative for fever.  HENT: Positive for hearing loss. Negative for congestion and ear pain.   Eyes:  Negative for pain and discharge.  Respiratory: Negative for cough.   Cardiovascular: Negative for chest pain, palpitations and leg swelling.  Gastrointestinal: Negative.  Negative for abdominal pain and constipation.  Endocrine: Negative for polydipsia.  Genitourinary: Negative.  Negative for dysuria.  Musculoskeletal: Negative for back pain and neck pain.       Unstable gait. History of multiple falls.  Skin:       History right mastectomy for breast cancer      Neurological: Negative for dizziness, tremors and weakness.       Progressive supranuclear palsy. Ataxia. Unstable gait. Clumsy right leg and foot.  Psychiatric/Behavioral: The patient is not nervous/anxious.     Immunization History  Administered Date(s) Administered    . Influenza Whole 02/09/2013  . Influenza-Unspecified 01/26/2014, 01/28/2015  . Tdap 11/04/2012   Pertinent  Health Maintenance Due  Topic Date Due  . DEXA SCAN  06/06/1990  . PNA vac Low Risk Adult (1 of 2 - PCV13) 06/06/1990  . INFLUENZA VACCINE  11/22/2015   Fall Risk  01/03/2015 07/09/2014 07/09/2014 12/15/2013 02/17/2013  Falls in the past year? No Yes Yes Yes Yes  Number falls in past yr: - 2 or more 2 or more 2 or more 2 or more  Injury with Fall? - Yes Yes - -  Risk Factor Category  - - High Fall Risk - -  Risk for fall due to : History of fall(s);Impaired balance/gait History of fall(s);Impaired balance/gait History of fall(s);Impaired balance/gait;Impaired mobility History of fall(s);Impaired balance/gait Impaired balance/gait  Follow up - - Falls evaluation completed - -   Functional Status Survey:    Filed Vitals:   09/27/15 1401  BP: 110/70  Pulse: 68  Temp: 97.6 F (36.4 C)  TempSrc: Oral  Resp: 20  Height: 5\' 5"  (1.651 m)  Weight: 111 lb 6.4 oz (50.531 kg)  SpO2: 93%   Body mass index is 18.54 kg/(m^2). Physical Exam  Constitutional: She is oriented to person, place, and time. She appears well-developed and well-nourished. No distress.  HENT:  Head: Normocephalic.  Right Ear: External ear normal.  Left Ear: External ear normal.  Nose: Nose normal.  Mouth/Throat: Oropharynx is clear and moist. No oropharyngeal exudate.  Eyes: Conjunctivae are normal. Right eye exhibits no discharge. Left eye exhibits no discharge. No scleral icterus.  Difficulty looking down followed by an upgaze ?palsy  Neck: Normal range of motion. Neck supple. No JVD present. No tracheal deviation present. No thyromegaly present.  Cardiovascular: Normal rate, regular rhythm, normal heart sounds and intact distal pulses.   No murmur heard. Pulmonary/Chest: Effort normal and breath sounds normal. She has no wheezes. She has no rales. She exhibits no tenderness.  Abdominal: Soft. Bowel  sounds are normal. She exhibits no distension. There is no tenderness.  Musculoskeletal: Normal range of motion. She exhibits no edema or tenderness.  Stiffness limbs  Neurological: She is alert and oriented to person, place, and time. She displays normal reflexes. No cranial nerve deficit. She exhibits normal muscle tone. Coordination abnormal.  Skin: Skin is warm and dry. No rash noted. She is not diaphoretic. No erythema. No pallor.  History right mastectomy for breast cancer     Psychiatric: She has a normal mood and affect. Her speech is normal and behavior is normal. Judgment and thought content normal. Cognition and memory are impaired. She does not express impulsivity or inappropriate judgment. She exhibits abnormal recent memory. She exhibits normal remote memory.    Labs reviewed:  Recent  Labs  06/11/15  NA 142  K 4.5  BUN 23*  CREATININE 1.0    Recent Labs  06/11/15  AST 14  ALT 9  ALKPHOS 68    Recent Labs  06/11/15  WBC 7.4  HGB 13.2  HCT 38  PLT 292   Lab Results  Component Value Date   TSH 2.07 06/11/2015   No results found for: HGBA1C Lab Results  Component Value Date   CHOL * 10/14/2009    213        ATP III CLASSIFICATION:  <200     mg/dL   Desirable  200-239  mg/dL   Borderline High  >=240    mg/dL   High          HDL 64 10/14/2009   LDLCALC * 10/14/2009    132        Total Cholesterol/HDL:CHD Risk Coronary Heart Disease Risk Table                     Men   Women  1/2 Average Risk   3.4   3.3  Average Risk       5.0   4.4  2 X Average Risk   9.6   7.1  3 X Average Risk  23.4   11.0        Use the calculated Patient Ratio above and the CHD Risk Table to determine the patient's CHD Risk.        ATP III CLASSIFICATION (LDL):  <100     mg/dL   Optimal  100-129  mg/dL   Near or Above                    Optimal  130-159  mg/dL   Borderline  160-189  mg/dL   High  >190     mg/dL   Very High   TRIG 87 10/14/2009   CHOLHDL 3.3  10/14/2009    Significant Diagnostic Results in last 30 days:  No results found.  Assessment/Plan Cerebrovascular disease Noted on CT and MRI of the brain 10/14/09, requires more assistance with ADLs in SNF, no focal weakness.     Dysphagia, oropharyngeal phase Modify diet. Risk for aspiration.  Progressive supranuclear palsy ([place]) 10/05/14 Neurology: continue ASA 81mg , does not report swallowing difficulty at this time, make sure she is well hydrated. -- no significant change.  Senile degeneration of brain Noted on CT and MRI of the brain 10/14/09, requires more assistance with ADLs in SNF   Gait disorder Started June 2011. Patient has cerebral atrophy, cerebrovascular disease, and supranuclear palsy.     Family/ staff Communication: continue SNF for care needs.   Labs/tests ordered:  none

## 2015-09-28 ENCOUNTER — Ambulatory Visit: Payer: Self-pay | Admitting: Neurology

## 2015-10-19 ENCOUNTER — Encounter: Payer: Self-pay | Admitting: Neurology

## 2015-10-19 ENCOUNTER — Telehealth: Payer: Self-pay

## 2015-10-19 ENCOUNTER — Ambulatory Visit (INDEPENDENT_AMBULATORY_CARE_PROVIDER_SITE_OTHER): Payer: Medicare Other | Admitting: Neurology

## 2015-10-19 VITALS — BP 122/65 | HR 61 | Ht 65.0 in | Wt 111.0 lb

## 2015-10-19 DIAGNOSIS — G231 Progressive supranuclear ophthalmoplegia [Steele-Richardson-Olszewski]: Secondary | ICD-10-CM

## 2015-10-19 NOTE — Progress Notes (Signed)
Reason for visit: Progressive supranuclear palsy  Monica Waller is an 82 y.o. female  History of present illness:  Monica Waller is a 82 year old left-handed white female with a history of progressive supranuclear palsy. The patient has continued to progress, she has gotten to the point where she is minimally verbal. She is nonambulatory. She is still able to eat and drink with a modified diet. The patient resides in an extended care facility. She comes in today for reevaluation. She does not have any significant issues with the skin. She has not required any hospitalizations recently for pneumonia. She returns to this office for an evaluation.  Past Medical History  Diagnosis Date  . Cerebral degeneration   . Ataxia   . Gait disorder   . Progressive supranuclear palsy ([place])   . Subdural hematoma (HCC)     Hx. of a left frontal  . 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 Other generalized ischemic cerebrovascular disease 10/14/09    Noted on CT and MR brain 10/14/09  . Dysphagia, oropharyngeal phase   . Cancer ([place])     Breast  . Malignant neoplasm of breast (female), unspecified site   . Central retinal artery occlusion 12/23/2013    Left eye  . Supranuclear palsy     Past Surgical History  Procedure Laterality Date  . Mastectomy Right 1998    Dr. Rebekah Chesterfield  . Tonsillectomy    . Cataract extraction, bilateral      Family History  Problem Relation Age of Onset  . Other Mother     "Old Age"  . Cancer Father   . Cancer Brother     Colon    Social history:  reports that she quit smoking about 50 years ago. Her smoking use included Cigarettes. She has never used smokeless tobacco. She reports that she does not drink alcohol or use illicit drugs.   No Known Allergies  Medications:  Prior to Admission medications   Medication Sig Start Date End Date Taking? Authorizing Provider  acetaminophen (TYLENOL) 325 MG tablet Take 2 tablets  (650 mg total) by mouth every 6 (six) hours as needed. 01/29/14   Erby Pian, NP  aspirin 81 MG tablet Take 81 mg by mouth daily.    Historical Provider, MD  bisacodyl (DULCOLAX) 10 MG suppository Place 10 mg rectally. One as needed for constipation    Historical Provider, MD  Calcium-Vitamin D (CALTRATE 600 PLUS-VIT D PO) Take 1 tablet by mouth daily.    Historical Provider, MD  multivitamin-iron-minerals-folic acid (CENTRUM) chewable tablet Chew 1 tablet by mouth daily.    Historical Provider, MD  senna (SENOKOT) 8.6 MG tablet Take 1 tablet by mouth 2 (two) times daily.    Historical Provider, MD    ROS:  Out of a complete 14 system review of symptoms, the patient complains only of the following symptoms, and all other reviewed systems are negative.  Speech difficulty Gait disorder Memory disorder  Blood pressure 122/65, pulse 61, height 5\' 5"  (1.651 m), weight 111 lb (50.349 kg).  Physical Exam  General: The patient is alert and cooperative at the time of the examination.  Skin: No significant peripheral edema is noted.   Neurologic Exam  Mental status: The patient is alert and cooperative, minimally verbal.   Cranial nerves: Facial symmetry is present. Prominent masking of the face is seen. Speech is somewhat dysarthric, the patient is minimally verbal,  answering in one word responses. The patient has restriction of superior and inferior gaze, as some horizontal gaze. She will blink to threat bilaterally.  Motor: The patient has good strength in all 4 extremities. Some increase in motor tone is noted on all 4 extremities.  Sensory examination: Soft touch sensation is symmetric on the face, arms, and legs.  Coordination: The patient has good finger-nose-finger bilaterally. Some apraxia with the use of the arms is noted.  Gait and station: The patient is nonambulatory, wheelchair bound.  Reflexes: Deep tendon reflexes are symmetric.   Assessment/Plan:  1. Progressive  supranuclear palsy  2. Memory disturbance  3. Gait disturbance  The patient has continued to progress. She is minimally verbal at this time. We have very little to offer this patient, I will have her follow-up to this office on an as-needed basis. The patient is residing in an extended care facility, she is getting assistance with basic activities of daily living.  Jill Alexanders MD 10/19/2015 6:51 PM  [facility] [address] [place], [place] [address]  Phone [phone] Fax [phone]

## 2015-10-19 NOTE — Patient Instructions (Signed)
Progressive Supranuclear Palsy Progressive supranuclear palsy is a rare brain disorder that causes a gradual deterioration of cells at the base of the brain. It causes serious problems with walking, eye movement, and balance. It may also cause behavior changes, depression, and loss of mental capacity (dementia).  Progressive supranuclear palsy tends to get worse over time.  CAUSES The exact cause of progressive supranuclear palsy is not known. In some cases, the condition may be caused by genes passed down through families (inherited). RISK FACTORS Progressive supranuclear palsy usually begins after age 60 and is slightly more common in men. SIGNS AND SYMPTOMS  The pattern of signs and symptoms can vary greatly from person to person. Signs and symptoms may include:  Loss of balance while walking.  Walking that is stiff and awkward.  Unexplained falls.  Inability to focus the eyes properly or blurred vision.  Changes in mood and behavior. These include:  Lack of feeling or emotion (apathy).  Irritability.  Sudden laughing or crying.  Personality changes.  Progressive mild dementia.  Difficulty swallowing and speaking. DIAGNOSIS  Your health care provider can diagnose progressive supranuclear palsy from your signs and symptoms. The main symptoms your health care provider will check for are:  Poor balance.  Vision problems.  Slurred speech.  Mental or behavioral changes. Your health care provider may also do some tests to rule out other causes of your symptoms. This may include checking your spinal fluid for abnormal proteins and taking images of your brain to look for areas of nerve cell loss. TREATMENT There is no cure for progressive supranuclear palsy. No treatment will slow the progression of the disease. Your treatment will be based on your symptoms. Treatment may include:  Medicines for Parkinson's disease. These may help with slowness, stiffness, and balance  problems.  Antidepressant medicines to treat depression.  Glasses called prisms to help with blurry vision.  Walking aids to prevent falls.  A surgical procedure to place a feeding tube into your stomach (gastrostomy) if swallowing becomes very hard. HOME CARE INSTRUCTIONS Work closely with your team of health care providers. Your team may include:  A physical therapist. Have him or her help you come up with a safe exercise program.  A speech and language therapist. Have him or her teach you ways to swallow foods and liquids safely. This specialist can also help you speak more clearly.  An occupational therapist. Have him or her help you learn to use walking aids and make your home safe. SEEK MEDICAL CARE IF:  You have chills or fever.  Your symptoms are getting worse.  You develop new symptoms.  You are having trouble getting enough nutrition.  You have a cough that will not go away.  You have shortness of breath.  You are anxious or depressed.  You are not getting enough support at home. SEEK IMMEDIATE MEDICAL CARE IF:  You choke, cough, or have trouble breathing after eating or drinking.  You hurt yourself in a fall.  You no longer feel safe at home.   This information is not intended to replace advice given to you by your health care provider. Make sure you discuss any questions you have with your health care provider.   Document Released: 03/30/2002 Document Revised: 04/30/2014 Document Reviewed: 04/08/2013 Elsevier Interactive Patient Education [employer].

## 2015-10-19 NOTE — Telephone Encounter (Signed)
Pt no-showed follow-up appt this morning.

## 2015-10-19 NOTE — Telephone Encounter (Signed)
Pt did show but never checked in. Re-scheduled to 11:30.

## 2015-11-18 ENCOUNTER — Encounter: Payer: Self-pay | Admitting: Nurse Practitioner

## 2015-11-18 ENCOUNTER — Non-Acute Institutional Stay (SKILLED_NURSING_FACILITY): Payer: Medicare Other | Admitting: Nurse Practitioner

## 2015-11-18 DIAGNOSIS — G231 Progressive supranuclear ophthalmoplegia [Steele-Richardson-Olszewski]: Secondary | ICD-10-CM | POA: Diagnosis not present

## 2015-11-18 DIAGNOSIS — R1312 Dysphagia, oropharyngeal phase: Secondary | ICD-10-CM

## 2015-11-18 DIAGNOSIS — I679 Cerebrovascular disease, unspecified: Secondary | ICD-10-CM | POA: Diagnosis not present

## 2015-11-18 NOTE — Progress Notes (Signed)
Location:   [facility] Room Number: N-18 Place of Service:  SNF (31) Provider:  Hardy Harcum  NP   Patient Care Team: Estill Dooms, MD as PCP - General (Internal Medicine) Chrissie Dacquisto Otho Darner, NP as Nurse Practitioner (Nurse Practitioner) [facility] Kathrynn Ducking, MD as Consulting Physician (Neurology) Luberta Mutter, MD as Consulting Physician (Ophthalmology) Kristeen Miss, MD as Consulting Physician (Neurosurgery)  Extended Emergency Contact Information Primary Emergency Contact: Romie Jumper Address: [address]          [place], [place] Phone: [phone] Relation: Neighbor Secondary Emergency Contact: [place] Phone: [phone] Relation: Other  Code Status: DNR Goals of care: Advanced Directive information Advanced Directives 11/18/2015  Does patient have an advance directive? -  Type of Paramedic of [place];Out of facility DNR (pink MOST or yellow form)  Does patient want to make changes to advanced directive? No - Patient declined  Copy of advanced directive(s) in chart? Yes  Pre-existing out of facility DNR order (yellow form or pink MOST form) -     Chief Complaint  Patient presents with  . Medical Management of Chronic Issues    HPI:  Pt is a 82 y.o. female seen today for medical management of chronic diseases.      Hx of progressive supra nuclear palsy, dysphagia, cerebrovascular disease.    Past Medical History:  Diagnosis Date  . Ataxia   . Cancer ([place])    Breast  . Central retinal artery occlusion 12/23/2013   Left eye  . Cerebral degeneration   . Dysphagia, oropharyngeal phase   . Gait disorder   . Malignant neoplasm of breast (female), unspecified site   . Other generalized ischemic cerebrovascular disease 10/14/09   Noted on CT and MR brain 10/14/09  . Progressive supranuclear palsy ([place])   . Senile degeneration of brain  10/14/09   Noted on CT and MRI of the brain 10/14/09  . Subdural hematoma (HCC)    Hx. of a left frontal  . Supranuclear palsy    Past Surgical History:  Procedure Laterality Date  . BREAST SURGERY    . CATARACT EXTRACTION, BILATERAL    . EYE SURGERY    . MASTECTOMY Right 1998   Dr. Rebekah Chesterfield  . TONSILLECTOMY      No Known Allergies    Medication List       Accurate as of 11/18/15  4:07 PM. Always use your most recent med list.          acetaminophen 325 MG tablet Commonly known as:  TYLENOL Take 2 tablets (650 mg total) by mouth every 6 (six) hours as needed.   aspirin 81 MG tablet Take 81 mg by mouth daily.   bisacodyl 10 MG suppository Commonly known as:  DULCOLAX Place 10 mg rectally. One as needed for constipation   CALTRATE 600 PLUS-VIT D PO Take 1 tablet by mouth daily.   feeding supplement Liqd Take 1 mL by mouth 3 (three) times daily between meals.   multivitamin-iron-minerals-folic acid chewable tablet Chew 1 tablet by mouth daily.   senna 8.6 MG tablet Commonly known as:  SENOKOT Take 1 tablet by mouth 2 (two) times daily.       Review of Systems  Constitutional: Negative for fever.  HENT: Positive for hearing loss. Negative for congestion and ear pain.   Eyes: Negative for pain and discharge.  Respiratory: Negative for cough.  Cardiovascular: Negative for chest pain, palpitations and leg swelling.  Gastrointestinal: Negative.  Negative for abdominal pain and constipation.  Endocrine: Negative for polydipsia.  Genitourinary: Negative.  Negative for dysuria.  Musculoskeletal: Negative for back pain and neck pain.       Unstable gait. History of multiple falls.  Skin:       History right mastectomy for breast cancer      Neurological: Negative for dizziness, tremors and weakness.       Progressive supranuclear palsy. Ataxia. Unstable gait. Clumsy right leg and foot.  Psychiatric/Behavioral: The patient is not nervous/anxious.      Immunization History  Administered Date(s) Administered  . Influenza Whole 02/09/2013  . Influenza-Unspecified 01/26/2014, 01/28/2015  . Tdap 11/04/2012   Pertinent  Health Maintenance Due  Topic Date Due  . DEXA SCAN  06/06/1990  . PNA vac Low Risk Adult (1 of 2 - PCV13) 06/06/1990  . INFLUENZA VACCINE  11/22/2015   Fall Risk  01/03/2015 07/09/2014 07/09/2014 12/15/2013 02/17/2013  Falls in the past year? No Yes Yes Yes Yes  Number falls in past yr: - 2 or more 2 or more 2 or more 2 or more  Injury with Fall? - Yes Yes - -  Risk Factor Category  - - High Fall Risk - -  Risk for fall due to : History of fall(s);Impaired balance/gait History of fall(s);Impaired balance/gait History of fall(s);Impaired balance/gait;Impaired mobility History of fall(s);Impaired balance/gait Impaired balance/gait  Follow up - - Falls evaluation completed - -   Functional Status Survey:    Vitals:   11/18/15 1524  BP: 114/68  Pulse: 72  Resp: 16  Temp: (!) 96.4 F (35.8 C)  SpO2: 98%  Weight: 104 lb 1.6 oz (47.2 kg)  Height: 5\' 5"  (1.651 m)   Body mass index is 17.32 kg/m. Physical Exam  Constitutional: She is oriented to person, place, and time. She appears well-developed and well-nourished. No distress.  HENT:  Head: Normocephalic.  Right Ear: External ear normal.  Left Ear: External ear normal.  Nose: Nose normal.  Mouth/Throat: Oropharynx is clear and moist. No oropharyngeal exudate.  Eyes: Conjunctivae are normal. Right eye exhibits no discharge. Left eye exhibits no discharge. No scleral icterus.  Difficulty looking down followed by an upgaze ?palsy  Neck: Normal range of motion. Neck supple. No JVD present. No tracheal deviation present. No thyromegaly present.  Cardiovascular: Normal rate, regular rhythm, normal heart sounds and intact distal pulses.   No murmur heard. Pulmonary/Chest: Effort normal and breath sounds normal. She has no wheezes. She has no rales. She exhibits no  tenderness.  Abdominal: Soft. Bowel sounds are normal. She exhibits no distension. There is no tenderness.  Musculoskeletal: Normal range of motion. She exhibits no edema or tenderness.  Stiffness limbs  Neurological: She is alert and oriented to person, place, and time. She displays normal reflexes. No cranial nerve deficit. She exhibits normal muscle tone. Coordination abnormal.  Skin: Skin is warm and dry. No rash noted. She is not diaphoretic. No erythema. No pallor.  History right mastectomy for breast cancer     Psychiatric: She has a normal mood and affect. Her speech is normal and behavior is normal. Judgment and thought content normal. Cognition and memory are impaired. She does not express impulsivity or inappropriate judgment. She exhibits abnormal recent memory. She exhibits normal remote memory.    Labs reviewed:  Recent Labs  06/11/15  NA 142  K 4.5  BUN 23*  CREATININE 1.0  Recent Labs  06/11/15  AST 14  ALT 9  ALKPHOS 68    Recent Labs  06/11/15  WBC 7.4  HGB 13.2  HCT 38  PLT 292   Lab Results  Component Value Date   TSH 2.07 06/11/2015   No results found for: HGBA1C Lab Results  Component Value Date   CHOL (H) 10/14/2009    213        ATP III CLASSIFICATION:  <200     mg/dL   Desirable  200-239  mg/dL   Borderline High  >=240    mg/dL   High          HDL 64 10/14/2009   LDLCALC (H) 10/14/2009    132        Total Cholesterol/HDL:CHD Risk Coronary Heart Disease Risk Table                     Men   Women  1/2 Average Risk   3.4   3.3  Average Risk       5.0   4.4  2 X Average Risk   9.6   7.1  3 X Average Risk  23.4   11.0        Use the calculated Patient Ratio above and the CHD Risk Table to determine the patient's CHD Risk.        ATP III CLASSIFICATION (LDL):  <100     mg/dL   Optimal  100-129  mg/dL   Near or Above                    Optimal  130-159  mg/dL   Borderline  160-189  mg/dL   High  >190     mg/dL   Very High    TRIG 87 10/14/2009   CHOLHDL 3.3 10/14/2009    Significant Diagnostic Results in last 30 days:  No results found.  Assessment/Plan There are no diagnoses linked to this encounter.Cerebrovascular disease Noted on CT and MRI of the brain 10/14/09, requires more assistance with ADLs in SNF, no focal weakness.  Dysphagia, oropharyngeal phase Modify diet. Risk for aspiration.  Progressive supranuclear palsy [facility]) F/u Neurology, 10/19/15 The patient has continued to progress. She is minimally verbal at this time. We have very little to offer this patient, I will have her follow-up to this office on an as-needed basis. The patient is residing in an extended care facility, she is getting assistance with basic activities of daily living.     Family/ staff Communication: continue SNF for care assistance  Labs/tests ordered:  none

## 2015-11-18 NOTE — Assessment & Plan Note (Signed)
F/u Neurology, 10/19/15 The patient has continued to progress. She is minimally verbal at this time. We have very little to offer this patient, I will have her follow-up to this office on an as-needed basis. The patient is residing in an extended care facility, she is getting assistance with basic activities of daily living.

## 2015-11-18 NOTE — Assessment & Plan Note (Signed)
Modify diet. Risk for aspiration.    

## 2015-11-18 NOTE — Assessment & Plan Note (Signed)
Noted on CT and MRI of the brain 10/14/09, requires more assistance with ADLs in SNF, no focal weakness.

## 2015-12-09 ENCOUNTER — Non-Acute Institutional Stay (SKILLED_NURSING_FACILITY): Payer: Medicare Other | Admitting: Internal Medicine

## 2015-12-09 ENCOUNTER — Encounter: Payer: Self-pay | Admitting: Internal Medicine

## 2015-12-09 DIAGNOSIS — R1312 Dysphagia, oropharyngeal phase: Secondary | ICD-10-CM

## 2015-12-09 DIAGNOSIS — G231 Progressive supranuclear ophthalmoplegia [Steele-Richardson-Olszewski]: Secondary | ICD-10-CM

## 2015-12-09 DIAGNOSIS — R627 Adult failure to thrive: Secondary | ICD-10-CM | POA: Diagnosis not present

## 2015-12-09 DIAGNOSIS — R634 Abnormal weight loss: Secondary | ICD-10-CM

## 2015-12-09 HISTORY — DX: Abnormal weight loss: R63.4

## 2015-12-09 NOTE — Progress Notes (Signed)
Location:   [facility] Room Number: [id number] Place of Service:  SNF [phone]) Provider: Jeanmarie Hubert, MD  Patient Care Team: Estill Dooms, MD as PCP - General (Internal Medicine) Man Otho Darner, NP as Nurse Practitioner (Nurse Practitioner) [facility] Kathrynn Ducking, MD as Consulting Physician (Neurology) Luberta Mutter, MD as Consulting Physician (Ophthalmology) Kristeen Miss, MD as Consulting Physician (Neurosurgery)  Extended Emergency Contact Information Primary Emergency Contact: Romie Jumper Address: [address]          [place], [place] of [place] Phone: [phone] Relation: Neighbor Secondary Emergency Contact: [place] of [place] Phone: [phone] Relation: Other  Code Status:  DNR Goals of care: Advanced Directive information Advanced Directives 12/09/2015  Does patient have an advance directive? Yes  Type of Paramedic of [place];Living will;Out of facility DNR (pink MOST or yellow form)  Does patient want to make changes to advanced directive? -  Copy of advanced directive(s) in chart? Yes  Pre-existing out of facility DNR order (yellow form or pink MOST form) -     Chief Complaint  Patient presents with  . Medical Management of Chronic Issues    routine medication management    HPI:  Pt is a 82 y.o. female seen today for medical management of chronic diseases.    Progressive supranuclear palsy (HCC) -= Continues to be followed by Dr. Jannifer Franklin. Last visit with him was 10/20/2015. He had no additional suggestions regarding management. He noted that she is minimally verbal. He suggested follow-up at his office only on an as-needed basis.  Dysphagia, oropharyngeal phase - continues to have poor intake related to the dysphagia. Denies any pain.  Loss of weight - continuing weight loss over the last year. Problem is secondary to her dysphagia secondary to the progressive  supranuclear palsy.  Past Medical History:  Diagnosis Date  . Ataxia   . Cancer ([place])    Breast  . Central retinal artery occlusion 12/23/2013   Left eye  . Cerebral degeneration   . Dysphagia, oropharyngeal phase   . Gait disorder   . Malignant neoplasm of breast (female), unspecified site   . Other generalized ischemic cerebrovascular disease 10/14/09   Noted on CT and MR brain 10/14/09  . Progressive supranuclear palsy ([place])   . 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 Subdural hematoma (HCC)    Hx. of a left frontal  . Supranuclear palsy    Past Surgical History:  Procedure Laterality Date  . BREAST SURGERY    . CATARACT EXTRACTION, BILATERAL    . EYE SURGERY    . MASTECTOMY Right 1998   Dr. Rebekah Chesterfield  . TONSILLECTOMY      No Known Allergies    Medication List       Accurate as of 12/09/15 10:06 AM. Always use your most recent med list.          acetaminophen 325 MG tablet Commonly known as:  TYLENOL Take 2 tablets (650 mg total) by mouth every 6 (six) hours as needed.   aspirin 81 MG tablet Take 81 mg by mouth daily.   bisacodyl 10 MG suppository Commonly known as:  DULCOLAX Place 10 mg rectally. One as needed for constipation   CALTRATE 600 PLUS-VIT D PO Take 1 tablet by mouth daily.   feeding supplement Liqd Take 1 mL by mouth 3 (three) times daily between  meals.   multivitamin-iron-minerals-folic acid chewable tablet Chew 1 tablet by mouth daily.   senna 8.6 MG tablet Commonly known as:  SENOKOT Take 1 tablet by mouth 2 (two) times daily.       Review of Systems  Constitutional: Negative for fever.       Persistent weight loss over the last year secondary to dysphagia related to her progressive supranuclear palsy  HENT: Positive for hearing loss. Negative for congestion and ear pain.   Eyes: Negative for pain and discharge.  Respiratory: Negative for cough.   Cardiovascular: Negative for chest pain,  palpitations and leg swelling.  Gastrointestinal: Negative.  Negative for abdominal pain and constipation.  Endocrine: Negative for polydipsia.  Genitourinary: Negative.  Negative for dysuria.  Musculoskeletal: Negative for back pain and neck pain.       No longer walking  patient is bedridden and wheelchair confined . History of multiple falls.  Skin:       History right mastectomy.  Neurological: Negative for dizziness, tremors and weakness.       Progressive supranuclear palsy. Ataxia. Dysarthria. Clumsy right leg and foot.  Psychiatric/Behavioral: The patient is not nervous/anxious.     Immunization History  Administered Date(s) Administered  . Influenza Whole 02/09/2013  . Influenza-Unspecified 01/26/2014, 01/28/2015  . Tdap 11/04/2012   Pertinent  Health Maintenance Due  Topic Date Due  . DEXA SCAN  06/06/1990  . PNA vac Low Risk Adult (1 of 2 - PCV13) 06/06/1990  . INFLUENZA VACCINE  11/22/2015   Fall Risk  01/03/2015 07/09/2014 07/09/2014 12/15/2013 02/17/2013  Falls in the past year? No Yes Yes Yes Yes  Number falls in past yr: - 2 or more 2 or more 2 or more 2 or more  Injury with Fall? - Yes Yes - -  Risk Factor Category  - - High Fall Risk - -  Risk for fall due to : History of fall(s);Impaired balance/gait History of fall(s);Impaired balance/gait History of fall(s);Impaired balance/gait;Impaired mobility History of fall(s);Impaired balance/gait Impaired balance/gait  Follow up - - Falls evaluation completed - -   Functional Status Survey:    Vitals:   12/09/15 0957  BP: (!) 141/61  Pulse: 64  Temp: 97.5 F (36.4 C)  Weight: 104 lb (47.2 kg)  Height: 5\' 5"  (1.651 m)   Body mass index is 17.31 kg/m. Physical Exam  Constitutional: She is oriented to person, place, and time. No distress.  Thin and frail  HENT:  Head: Normocephalic.  Right Ear: External ear normal.  Left Ear: External ear normal.  Nose: Nose normal.  Mouth/Throat: Oropharynx is clear and  moist. No oropharyngeal exudate.  Eyes: Conjunctivae are normal. Right eye exhibits no discharge. Left eye exhibits no discharge. No scleral icterus.  Difficulty looking down followed by an upgaze ?palsy  Neck: Normal range of motion. Neck supple. No JVD present. No tracheal deviation present. No thyromegaly present.  Cardiovascular: Normal rate, regular rhythm, normal heart sounds and intact distal pulses.   No murmur heard. Pulmonary/Chest: Effort normal and breath sounds normal. She has no wheezes. She has no rales. She exhibits no tenderness.  Abdominal: Soft. Bowel sounds are normal. She exhibits no distension. There is no tenderness.  Musculoskeletal: Normal range of motion. She exhibits no edema or tenderness.  Stiffness limbs  Neurological: She is alert and oriented to person, place, and time. She displays normal reflexes. No cranial nerve deficit. She exhibits normal muscle tone. Coordination abnormal.  Minimally verbal . Dysarthria and dysphagia related  to supranuclear palsy.  Skin: Skin is warm and dry. No rash noted. She is not diaphoretic. No erythema. No pallor.  History right mastectomy for breast cancer  Psychiatric: She has a normal mood and affect. Her speech is normal and behavior is normal. Judgment and thought content normal. Cognition and memory are impaired. She does not express impulsivity or inappropriate judgment. She exhibits abnormal recent memory.    Labs reviewed:  Recent Labs  06/11/15  NA 142  K 4.5  BUN 23*  CREATININE 1.0    Recent Labs  06/11/15  AST 14  ALT 9  ALKPHOS 68    Recent Labs  06/11/15  WBC 7.4  HGB 13.2  HCT 38  PLT 292   Lab Results  Component Value Date   TSH 2.07 06/11/2015   No results found for: HGBA1C Lab Results  Component Value Date   CHOL (H) 10/14/2009    213        ATP III CLASSIFICATION:  <200     mg/dL   Desirable  200-239  mg/dL   Borderline High  >=240    mg/dL   High          HDL 64 10/14/2009    LDLCALC (H) 10/14/2009    132        Total Cholesterol/HDL:CHD Risk Coronary Heart Disease Risk Table                     Men   Women  1/2 Average Risk   3.4   3.3  Average Risk       5.0   4.4  2 X Average Risk   9.6   7.1  3 X Average Risk  23.4   11.0        Use the calculated Patient Ratio above and the CHD Risk Table to determine the patient's CHD Risk.        ATP III CLASSIFICATION (LDL):  <100     mg/dL   Optimal  100-129  mg/dL   Near or Above                    Optimal  130-159  mg/dL   Borderline  160-189  mg/dL   High  >190     mg/dL   Very High   TRIG 87 10/14/2009   CHOLHDL 3.3 10/14/2009    Assessment/Plan 1. Progressive supranuclear palsy (HCC) Slow progression  2. Dysphagia, oropharyngeal phase  3. Loss of weight Related to poor intake   Patient continues to decline slowly. I do not expect any reversal of her current situation. She is alert and has entered a failure to thrive stage of her disease.

## 2016-01-22 DEATH — deceased
